# Patient Record
Sex: Female | Born: 1995 | Race: Black or African American | Hispanic: No | Marital: Married | Smoking: Never smoker
Health system: Southern US, Community
[De-identification: ages and names within clinical notes are randomized; demographics above are authoritative.]

## PROBLEM LIST (undated history)

## (undated) ENCOUNTER — Inpatient Hospital Stay (HOSPITAL_COMMUNITY): Payer: Self-pay

## (undated) ENCOUNTER — Emergency Department (HOSPITAL_COMMUNITY): Admission: EM | Payer: Medicaid Other | Source: Home / Self Care

## (undated) DIAGNOSIS — Z5189 Encounter for other specified aftercare: Secondary | ICD-10-CM

## (undated) DIAGNOSIS — G500 Trigeminal neuralgia: Secondary | ICD-10-CM

## (undated) DIAGNOSIS — Z789 Other specified health status: Secondary | ICD-10-CM

## (undated) HISTORY — DX: Trigeminal neuralgia: G50.0

## (undated) HISTORY — PX: INDUCED ABORTION: SHX677

## (undated) HISTORY — PX: NO PAST SURGERIES: SHX2092

## (undated) HISTORY — DX: Encounter for other specified aftercare: Z51.89

---

## 2018-04-21 ENCOUNTER — Encounter (HOSPITAL_COMMUNITY): Payer: Self-pay | Admitting: Emergency Medicine

## 2018-04-21 ENCOUNTER — Emergency Department (HOSPITAL_COMMUNITY): Payer: BC Managed Care – PPO

## 2018-04-21 ENCOUNTER — Emergency Department (HOSPITAL_COMMUNITY)
Admission: EM | Admit: 2018-04-21 | Discharge: 2018-04-21 | Disposition: A | Discharge status: Home / Self Care | Payer: BC Managed Care – PPO | Attending: Emergency Medicine | Admitting: Emergency Medicine

## 2018-04-21 ENCOUNTER — Other Ambulatory Visit: Payer: Self-pay

## 2018-04-21 DIAGNOSIS — Z2913 Encounter for prophylactic Rho(D) immune globulin: Secondary | ICD-10-CM | POA: Insufficient documentation

## 2018-04-21 DIAGNOSIS — O418X10 Other specified disorders of amniotic fluid and membranes, first trimester, not applicable or unspecified: Secondary | ICD-10-CM

## 2018-04-21 DIAGNOSIS — O208 Other hemorrhage in early pregnancy: Secondary | ICD-10-CM | POA: Insufficient documentation

## 2018-04-21 DIAGNOSIS — Z3A01 Less than 8 weeks gestation of pregnancy: Secondary | ICD-10-CM | POA: Diagnosis not present

## 2018-04-21 DIAGNOSIS — O4690 Antepartum hemorrhage, unspecified, unspecified trimester: Secondary | ICD-10-CM

## 2018-04-21 DIAGNOSIS — O209 Hemorrhage in early pregnancy, unspecified: Secondary | ICD-10-CM | POA: Diagnosis present

## 2018-04-21 DIAGNOSIS — O468X1 Other antepartum hemorrhage, first trimester: Secondary | ICD-10-CM

## 2018-04-21 LAB — ABO/RH: ABO/RH(D): O NEG

## 2018-04-21 MED ORDER — RHO D IMMUNE GLOBULIN 1500 UNIT/2ML IJ SOSY
300.0000 ug | PREFILLED_SYRINGE | Freq: Once | INTRAMUSCULAR | Status: AC
  Administered 2018-04-21: 300 ug via INTRAMUSCULAR
  Filled 2018-04-21: qty 2

## 2018-04-21 NOTE — ED Notes (Signed)
US at bedside

## 2018-04-21 NOTE — ED Triage Notes (Signed)
Pt arriving [redacted] weeks pregnant with vaginal bleeding that began today. Pt reports feeling a cramping sensation as well.

## 2018-04-21 NOTE — ED Provider Notes (Signed)
[facility]-EMERGENCY DEPT Provider Note   CSN: [id number] Arrival date & time: 04/21/18  4098     History   Chief Complaint Chief Complaint  Patient presents with  . Vaginal Bleeding    HPI Lindsay Phillips is a 22 y.o. female.  Patient is a 22 year old female who presents with vaginal bleeding.  She has about [redacted] weeks pregnant.  She states her last menstrual.  Was around 1 October.  She had a positive pregnancy test confirmed [place] [facility] but she has not had an ultrasound.  She today started having vaginal bleeding which she describes as a normal period type bleeding.  She has a little bit of lower abdominal cramping.  No dizziness.  No nausea or vomiting.  She is G2 she has had one prior abortion.  She is Rh-.     History reviewed. No pertinent past medical history.  There are no active problems to display for this patient.   History reviewed. No pertinent surgical history.   OB History    Gravida  1   Para      Term      Preterm      AB      Living        SAB      TAB      Ectopic      Multiple      Live Births               Home Medications    Prior to Admission medications   Medication Sig Start Date End Date Taking? Authorizing Provider  Omega-3 Fatty Acids (FISH OIL) 1000 MG CAPS Take 1 capsule by mouth daily.   Yes [provider]  Prenatal Vit-Fe Fumarate-FA (PRENATAL MULTIVITAMIN) TABS tablet Take 1 tablet by mouth daily at 12 noon.   Yes [provider]    Family History No family history on file.  Social History Social History   Tobacco Use  . Smoking status: Not on file  Substance Use Topics  . Alcohol use: Not on file  . Drug use: Not on file     Allergies   Patient has no known allergies.   Review of Systems Review of Systems  Constitutional: Negative for chills, diaphoresis, fatigue and fever.  HENT: Negative for congestion, rhinorrhea and sneezing.   Eyes:  Negative.   Respiratory: Negative for cough, chest tightness and shortness of breath.   Cardiovascular: Negative for chest pain and leg swelling.  Gastrointestinal: Negative for abdominal pain, blood in stool, diarrhea, nausea and vomiting.  Genitourinary: Positive for vaginal bleeding. Negative for difficulty urinating, flank pain, frequency, hematuria and vaginal discharge.  Musculoskeletal: Negative for arthralgias and back pain.  Skin: Negative for rash.  Neurological: Negative for dizziness, speech difficulty, weakness, numbness and headaches.     Physical Exam Updated Vital Signs BP 112/77   Pulse 84   Temp 97.9 F (36.6 C) (Oral)   Resp 16   SpO2 99%   Physical Exam  Constitutional: She is oriented to person, place, and time. She appears well-developed and well-nourished.  HENT:  Head: Normocephalic and atraumatic.  Eyes: Pupils are equal, round, and reactive to light.  Neck: Normal range of motion. Neck supple.  Cardiovascular: Normal rate, regular rhythm and normal heart sounds.  Pulmonary/Chest: Effort normal and breath sounds normal. No respiratory distress. She has no wheezes. She has no rales. She exhibits no tenderness.  Abdominal: Soft. Bowel sounds are normal. There is  no tenderness. There is no rebound and no guarding.  Musculoskeletal: Normal range of motion. She exhibits no edema.  Lymphadenopathy:    She has no cervical adenopathy.  Neurological: She is alert and oriented to person, place, and time.  Skin: Skin is warm and dry. No rash noted.  Psychiatric: She has a normal mood and affect.     ED Treatments / Results  Labs (all labs ordered are listed, but only abnormal results are displayed) Labs Reviewed  I-STAT BETA HCG BLOOD, ED ([facility], [facility], [facility] ONLY)  ABO/RH  RH IG WORKUP (INCLUDES ABO/RH)    EKG None  Radiology US Ob Comp < 14 Wks  Result Date: 04/21/2018 CLINICAL DATA:  Vaginal bleeding beginning today. Gestational age by LMP of 7 weeks 0  days. EXAM: OBSTETRIC <14 WK [place] AND TRANSVAGINAL OB US TECHNIQUE: Both transabdominal and transvaginal ultrasound examinations were performed for complete evaluation of the gestation as well as the maternal uterus, adnexal regions, and pelvic cul-de-sac. Transvaginal technique was performed to assess early pregnancy. COMPARISON:  None. FINDINGS: Intrauterine gestational sac: Single Yolk sac:  Visualized. Embryo:  Visualized. Cardiac Activity: Visualized. Heart Rate: 128 bpm CRL:  8 mm   6 w   6 d                  [place] EDC: 12/09/2018 Subchorionic hemorrhage:  Small subchorionic hemorrhage. Maternal uterus/adnexae: Normal appearance of both ovaries. Left ovarian corpus luteum noted. Retroverted uterus. No adnexal mass or abnormal free fluid identified. IMPRESSION: Single living IUP measuring 6 weeks 6 days, with [place] EDC of 12/09/2018. Small subchorionic hemorrhage noted. Electronically Signed   By: Myles Rosenthal M.D.   On: 04/21/2018 22:06   US Ob Transvaginal  Result Date: 04/21/2018 CLINICAL DATA:  Vaginal bleeding beginning today. Gestational age by LMP of 7 weeks 0 days. EXAM: OBSTETRIC <14 WK [place] AND TRANSVAGINAL OB US TECHNIQUE: Both transabdominal and transvaginal ultrasound examinations were performed for complete evaluation of the gestation as well as the maternal uterus, adnexal regions, and pelvic cul-de-sac. Transvaginal technique was performed to assess early pregnancy. COMPARISON:  None. FINDINGS: Intrauterine gestational sac: Single Yolk sac:  Visualized. Embryo:  Visualized. Cardiac Activity: Visualized. Heart Rate: 128 bpm CRL:  8 mm   6 w   6 d                  [place] EDC: 12/09/2018 Subchorionic hemorrhage:  Small subchorionic hemorrhage. Maternal uterus/adnexae: Normal appearance of both ovaries. Left ovarian corpus luteum noted. Retroverted uterus. No adnexal mass or abnormal free fluid identified. IMPRESSION: Single living IUP measuring 6 weeks 6 days, with [place] EDC of 12/09/2018. Small subchorionic  hemorrhage noted. Electronically Signed   By: Myles Rosenthal M.D.   On: 04/21/2018 22:06    Procedures Procedures (including critical care time)  Medications Ordered in ED Medications  rho (d) immune globulin (RHIG/RHOPHYLAC) injection 300 mcg (has no administration in time range)     Initial Impression / Assessment and Plan / ED Course  I have reviewed the triage vital signs and the nursing notes.  Pertinent labs & imaging results that were available during my care of the patient were reviewed by me and considered in my medical decision making (see chart for details).     Patient is a 23 year old female who presents with vaginal bleeding.  Her quant hCG is greater than 2000.  Her blood type is Rh-.  She was given RhoGam.  Her bleeding has stopped.  She  has no significant pain on abdominal exam.  Ultrasound shows a single live intrauterine pregnancy with a heartbeat.  There is a subchorionic hemorrhage present.  She was advised to follow-up with an OB/GYN.  She was advised to return [facility] the emergency room[facility] if she has any worsening abdominal pain, heavy vaginal bleeding, dizziness or other worsening symptoms.  Final Clinical Impressions(s) / ED Diagnoses   Final diagnoses:  Vaginal bleeding in pregnancy  Subchorionic hematoma in first trimester, single or unspecified fetus    ED Discharge Orders    None       Rolan BuccoBelfi, Catera Hankins, MD 04/21/18 2243

## 2018-04-21 NOTE — ED Notes (Addendum)
I Stat Beta HCG result was >2000.0 IU/L MD Belfi made aware

## 2018-04-21 NOTE — ED Notes (Signed)
Rh negative, type O negative.

## 2018-04-22 LAB — RH IG WORKUP (INCLUDES ABO/RH)
ABO/RH(D): O NEG
ANTIBODY SCREEN: NEGATIVE
GESTATIONAL AGE(WKS): 7
Unit division: 0
Unit tag comment: 7

## 2018-04-22 LAB — I-STAT BETA HCG BLOOD, ED (MC, WL, AP ONLY)

## 2018-05-13 ENCOUNTER — Other Ambulatory Visit (HOSPITAL_COMMUNITY)
Admission: RE | Admit: 2018-05-13 | Discharge: 2018-05-13 | Disposition: A | Discharge status: Home / Self Care | Payer: BC Managed Care – PPO | Source: Ambulatory Visit | Attending: Advanced Practice Midwife | Admitting: Advanced Practice Midwife

## 2018-05-13 ENCOUNTER — Encounter: Payer: Self-pay | Admitting: Advanced Practice Midwife

## 2018-05-13 ENCOUNTER — Ambulatory Visit (INDEPENDENT_AMBULATORY_CARE_PROVIDER_SITE_OTHER): Payer: BC Managed Care – PPO | Admitting: Advanced Practice Midwife

## 2018-05-13 DIAGNOSIS — Z3400 Encounter for supervision of normal first pregnancy, unspecified trimester: Secondary | ICD-10-CM | POA: Insufficient documentation

## 2018-05-13 DIAGNOSIS — Z3401 Encounter for supervision of normal first pregnancy, first trimester: Secondary | ICD-10-CM

## 2018-05-13 DIAGNOSIS — Z6791 Unspecified blood type, Rh negative: Secondary | ICD-10-CM | POA: Diagnosis not present

## 2018-05-13 DIAGNOSIS — O26891 Other specified pregnancy related conditions, first trimester: Secondary | ICD-10-CM

## 2018-05-13 DIAGNOSIS — O26899 Other specified pregnancy related conditions, unspecified trimester: Secondary | ICD-10-CM | POA: Insufficient documentation

## 2018-05-13 LAB — POCT URINALYSIS DIPSTICK OB
Blood, UA: NEGATIVE
Glucose, UA: NEGATIVE
Leukocytes, UA: NEGATIVE
PH UA: 7 (ref 5.0–8.0)
SPEC GRAV UA: 1.015 (ref 1.010–1.025)

## 2018-05-13 MED ORDER — DOXYLAMINE-PYRIDOXINE 10-10 MG PO TBEC: 2.0000 | DELAYED_RELEASE_TABLET | Freq: Every evening | ORAL | 5 refills | Status: DC | PRN

## 2018-05-13 NOTE — Patient Instructions (Addendum)
First Trimester of Pregnancy The first trimester of pregnancy is from week 1 until the end of week 13 (months 1 through 3). During this time, your baby will begin to develop inside you. At 6-8 weeks, the eyes and face are formed, and the heartbeat can be seen on ultrasound. At the end of 12 weeks, all the baby's organs are formed. Prenatal care is all the medical care you receive before the birth of your baby. Make sure you get good prenatal care and follow all of your doctor's instructions. Follow these instructions at home: Medicines  Take over-the-counter and prescription medicines only as told by your doctor. Some medicines are safe and some medicines are not safe during pregnancy.  Take a prenatal vitamin that contains at least 600 micrograms (mcg) of folic acid.  If you have trouble pooping (constipation), take medicine that will make your stool soft (stool softener) if your doctor approves. Eating and drinking  Eat regular, healthy meals.  Your doctor will tell you the amount of weight gain that is right for you.  Avoid raw meat and uncooked cheese.  If you feel sick to your stomach (nauseous) or throw up (vomit): ? Eat 4 or 5 small meals a day instead of 3 large meals. ? Try eating a few soda crackers. ? Drink liquids between meals instead of during meals.  To prevent constipation: ? Eat foods that are high in fiber, like fresh fruits and vegetables, whole grains, and beans. ? Drink enough fluids to keep your pee (urine) clear or pale yellow. Activity  Exercise only as told by your doctor. Stop exercising if you have cramps or pain in your lower belly (abdomen) or low back.  Do not exercise if it is too hot, too humid, or if you are in a place of great height (high altitude).  Try to avoid standing for long periods of time. Move your legs often if you must stand in one place for a long time.  Avoid heavy lifting.  Wear low-heeled shoes. Sit and stand up straight.  You  can have sex unless your doctor tells you not to. Relieving pain and discomfort  Wear a good support bra if your breasts are sore.  Take warm water baths (sitz baths) to soothe pain or discomfort caused by hemorrhoids. Use hemorrhoid cream if your doctor says it is okay.  Rest with your legs raised if you have leg cramps or low back pain.  If you have puffy, bulging veins (varicose veins) in your legs: ? Wear support hose or compression stockings as told by your doctor. ? Raise (elevate) your feet for 15 minutes, 3-4 times a day. ? Limit salt in your food. Prenatal care  Schedule your prenatal visits by the twelfth week of pregnancy.  Write down your questions. Take them to your prenatal visits.  Keep all your prenatal visits as told by your doctor. This is important. Safety  Wear your seat belt at all times when driving.  Make a list of emergency phone numbers. The list should include numbers for family, friends, the hospital, and police and fire departments. General instructions  Ask your doctor for a referral to a local prenatal class. Begin classes no later than at the start of month 6 of your pregnancy.  Ask for help if you need counseling or if you need help with nutrition. Your doctor can give you advice or tell you where to go for help.  Do not use hot tubs, steam rooms, or   saunas.  Do not douche or use tampons or scented sanitary pads.  Do not cross your legs for long periods of time.  Avoid all herbs and alcohol. Avoid drugs that are not approved by your doctor.  Do not use any tobacco products, including cigarettes, chewing tobacco, and electronic cigarettes. If you need help quitting, ask your doctor. You may get counseling or other support to help you quit.  Avoid cat litter boxes and soil used by cats. These carry germs that can cause birth defects in the baby and can cause a loss of your baby (miscarriage) or stillbirth.  Visit your dentist. At home, brush  your teeth with a soft toothbrush. Be gentle when you floss. Contact a doctor if:  You are dizzy.  You have mild cramps or pressure in your lower belly.  You have a nagging pain in your belly area.  You continue to feel sick to your stomach, you throw up, or you have watery poop (diarrhea).  You have a bad smelling fluid coming from your vagina.  You have pain when you pee (urinate).  You have increased puffiness (swelling) in your face, hands, legs, or ankles. Get help right away if:  You have a fever.  You are leaking fluid from your vagina.  You have spotting or bleeding from your vagina.  You have very bad belly cramping or pain.  You gain or lose weight rapidly.  You throw up blood. It may look like coffee grounds.  You are around people who have [place] measles, fifth disease, or chickenpox.  You have a very bad headache.  You have shortness of breath.  You have any kind of trauma, such as from a fall or a car accident. Summary  The first trimester of pregnancy is from week 1 until the end of week 13 (months 1 through 3).  To take care of yourself and your unborn baby, you will need to eat healthy meals, take medicines only if your doctor tells you to do so, and do activities that are safe for you and your baby.  Keep all follow-up visits as told by your doctor. This is important as your doctor will have to ensure that your baby is healthy and growing well. This information is not intended to replace advice given to you by your health care provider. Make sure you discuss any questions you have with your health care provider. Document Released: 10/31/2007 Document Revised: 05/22/2016 Document Reviewed: 05/22/2016 Elsevier Interactive Patient Education  2017 Elsevier Inc.  Morning Sickness Morning sickness is when you feel sick to your stomach (nauseous) during pregnancy. This nauseous feeling may or may not come with vomiting. It often occurs in the morning but  can be a problem any time of day. Morning sickness is most common during the first trimester, but it may continue throughout pregnancy. While morning sickness is unpleasant, it is usually harmless unless you develop severe and continual vomiting (hyperemesis gravidarum). This condition requires more intense treatment. What are the causes? The cause of morning sickness is not completely known but seems to be related to normal hormonal changes that occur in pregnancy. What increases the risk? You are at greater risk if you:  Experienced nausea or vomiting before your pregnancy.  Had morning sickness during a previous pregnancy.  Are pregnant with more than one baby, such as twins.  How is this treated? Do not use any medicines (prescription, over-the-counter, or herbal) for morning sickness without first talking to your health care  provider. Your health care provider may prescribe or recommend:  Vitamin B6 supplements.  Anti-nausea medicines.  The herbal medicine ginger.  Follow these instructions at home:  Only take over-the-counter or prescription medicines as directed by your health care provider.  Taking multivitamins before getting pregnant can prevent or decrease the severity of morning sickness in most women.  Eat a piece of dry toast or unsalted crackers before getting out of bed in the morning.  Eat five or six small meals a day.  Eat dry and bland foods (rice, baked potato). Foods high in carbohydrates are often helpful.  Do not drink liquids with your meals. Drink liquids between meals.  Avoid greasy, fatty, and spicy foods.  Get someone to cook for you if the smell of any food causes nausea and vomiting.  If you feel nauseous after taking prenatal vitamins, take the vitamins at night or with a snack.  Snack on protein foods (nuts, yogurt, cheese) between meals if you are hungry.  Eat unsweetened gelatins for desserts.  Wearing an acupressure wristband (worn for  sea sickness) may be helpful.  Acupuncture may be helpful.  Do not smoke.  Get a humidifier to keep the air in your house free of odors.  Get plenty of fresh air. Contact a health care provider if:  Your home remedies are not working, and you need medicine.  You feel dizzy or lightheaded.  You are losing weight. Get help right away if:  You have persistent and uncontrolled nausea and vomiting.  You pass out (faint). This information is not intended to replace advice given to you by your health care provider. Make sure you discuss any questions you have with your health care provider. Document Released: 07/05/2006 Document Revised: 10/20/2015 Document Reviewed: 10/29/2012 Elsevier Interactive Patient Education  2017 [employer].

## 2018-05-13 NOTE — Progress Notes (Signed)
  Subjective:    Lindsay PollockMariah Youmans is a G2P0010 1619w6d being seen today for her first obstetrical visit.  Her obstetrical history is significant for Rh Negative. Patient does intend to breast feed. Pregnancy history fully reviewed.  Patient reports no complaints. Except some nausea at times.  May be interested in waterbirth.  Vitals:   05/13/18 0838 05/13/18 0841  BP: 117/77   Pulse: 73   Weight: 73.5 kg   Height:  5\' 5"  (1.651 m)    HISTORY: OB History  Gravida Para Term Preterm AB Living  2       1    SAB TAB Ectopic Multiple Live Births    1          # Outcome Date GA Lbr Len/2nd Weight Sex Delivery Anes PTL Lv  2 Current           1 TAB 06/2016           No past medical history on file. No past surgical history on file. No family history on file.   Exam    Uterus:     Pelvic Exam:    Perineum: No Hemorrhoids   Vulva: Bartholin's, Urethra, Skene's normal   Vagina:  normal mucosa, normal discharge   pH:    Cervix: no lesions   Adnexa: normal adnexa and no mass, fullness, tenderness   Bony Pelvis: gynecoid  System: Breast:  normal appearance, no masses or tenderness   Skin: normal coloration and turgor, no rashes    Neurologic: oriented, grossly non-focal   Extremities: normal strength, tone, and muscle mass   HEENT neck supple with midline trachea   Mouth/Teeth mucous membranes moist, pharynx normal without lesions   Neck supple and no masses   Cardiovascular: regular rate and rhythm   Respiratory:  appears well, vitals normal, no respiratory distress, acyanotic, normal RR, ear and throat exam is normal, neck free of mass or lymphadenopathy, chest clear, no wheezing, crepitations, rhonchi, normal symmetric air entry   Abdomen: soft, non-tender; bowel sounds normal; no masses,  no organomegaly   Urinary: urethral meatus normal      Assessment:    Pregnancy: G2P0010 Patient Active Problem List   Diagnosis Date Noted  . Supervision of normal first pregnancy,  antepartum 05/13/2018        Plan:     Initial labs drawn. Prenatal vitamins. Problem list reviewed and updated. Genetic Screening discussed First Screen: Desires Panorama.  Ultrasound discussed; fetal survey: requested.  Follow up in 4 weeks. 50% of 30 min visit spent on counseling and coordination of care.   Welcomed to Youth workerpractice Info sheet given on [place] Routines reviewed. Open to seeing students Panorama at next visit   Wynelle BourgeoisMarie Kristain Hu 05/13/2018

## 2018-05-13 NOTE — Progress Notes (Signed)
DATING AND VIABILITY SONOGRAM   Jeffie PollockMariah Youmans is a 22 y.o. year old G2P0010 with LMP Patient's last menstrual period was 03/05/2018 (exact date). which would correlate to  7870w6d weeks gestation.  She has regular menstrual cycles.   She is here today for a confirmatory initial sonogram.    GESTATION: SINGLETON  FETAL ACTIVITY:          Heart rate         180 bpm          The fetus is active.   ADNEXA: The ovaries are normal.   GESTATIONAL AGE AND  BIOMETRICS:  Gestational criteria: Estimated Date of Delivery: 12/10/18 by LMP now at 8170w6d  Previous Scans:0  GESTATIONAL SAC           4.03 cm         9-3 weeks  CROWN RUMP LENGTH          2.68 cm         9-3 weeks                                                                               AVERAGE EGA(BY THIS SCAN):  9-3weeks  WORKING EDD( LMP ):  12/10/2018     TECHNICIAN COMMENTS: Patient informed that the ultrasound is considered a limited obstetric ultrasound and is not intended to be a complete ultrasound exam. Patient also informed that the ultrasound is not being completed with the intent of assessing for fetal or placental anomalies or any pelvic abnormalities. Explained that the purpose of today's ultrasound is to assess for fetal heart rate. Patient acknowledges the purpose of the exam and the limitations of the study.     Armandina StammerJennifer Fannie Gathright 05/13/2018 9:00 AM

## 2018-05-14 LAB — GC/CHLAMYDIA PROBE AMP (~~LOC~~) NOT AT ARMC
Chlamydia: NEGATIVE
Neisseria Gonorrhea: NEGATIVE

## 2018-05-16 LAB — URINE CULTURE, OB REFLEX

## 2018-05-16 LAB — CULTURE, OB URINE

## 2018-05-20 LAB — AB SCR+ANTIBODY ID: ANTIBODY SCREEN: POSITIVE — AB

## 2018-05-20 LAB — SMN1 COPY NUMBER ANALYSIS (SMA CARRIER SCREENING)

## 2018-05-20 LAB — OBSTETRIC PANEL, INCLUDING HIV
BASOS ABS: 0.1 10*3/uL (ref 0.0–0.2)
Basos: 1 %
EOS (ABSOLUTE): 0 10*3/uL (ref 0.0–0.4)
Eos: 1 %
HEMATOCRIT: 39.2 % (ref 34.0–46.6)
HEP B S AG: NEGATIVE
HIV SCREEN 4TH GENERATION: NONREACTIVE
Hemoglobin: 12.7 g/dL (ref 11.1–15.9)
IMMATURE GRANS (ABS): 0 10*3/uL (ref 0.0–0.1)
Immature Granulocytes: 1 %
LYMPHS: 22 %
Lymphocytes Absolute: 1.9 10*3/uL (ref 0.7–3.1)
MCH: 28.3 pg (ref 26.6–33.0)
MCHC: 32.4 g/dL (ref 31.5–35.7)
MCV: 87 fL (ref 79–97)
MONOCYTES: 6 %
Monocytes Absolute: 0.5 10*3/uL (ref 0.1–0.9)
NEUTROS ABS: 6 10*3/uL (ref 1.4–7.0)
Neutrophils: 69 %
PLATELETS: 245 10*3/uL (ref 150–450)
RBC: 4.49 x10E6/uL (ref 3.77–5.28)
RDW: 11.9 % — AB (ref 12.3–15.4)
RPR: NONREACTIVE
RUBELLA: 3.81 {index} (ref >0.99)
Rh Factor: NEGATIVE
WBC: 8.5 10*3/uL (ref 3.4–10.8)

## 2018-05-20 LAB — HEMOGLOBINOPATHY EVALUATION
Ferritin: 34 ng/mL (ref 15–150)
HGB A2 QUANT: 1.9 % (ref 1.8–3.2)
HGB F QUANT: 0 % (ref 0.0–2.0)
Hgb A: 98.1 % (ref 96.4–98.8)
Hgb C: 0 %
Hgb S: 0 %
Hgb Solubility: NEGATIVE
Hgb Variant: 0 %

## 2018-05-20 LAB — CYSTIC FIBROSIS GENE TEST

## 2018-05-28 NOTE — L&D Delivery Note (Signed)
Delivery Note Pushed only for a short while to SVD  At 7:15 AM a viable and healthy female was delivered via Vaginal, Spontaneous (Presentation: ;  ).  APGAR: 9, 9; weight 6 lb 8.8 oz (2970 g).  No difficulty with shoulders.   Placenta status: spontaneous and grossly intact with 3 vessel Cord:  with the following complications: .Nuchal cord x 2 tight, delivered through and unwound after delivery  There was blood loss prior to delivery of placenta.  Early Pitocin was begun at birth.  Bladder emptied by cath for 162ml.  Cytotec 850mcg buccally given.  Dr Nehemiah Settle present in room  Anesthesia:  Local for repair Episiotomy: None Lacerations: 1st degree;Perineal Suture Repair: 3.0 vicryl rapide Est. Blood Loss (mL): 1426  Mom to postpartum.  Baby to Couplet care / Skin to Skin.  Lindsay Phillips 12/03/2018, 9:03 AM

## 2018-06-02 ENCOUNTER — Telehealth: Payer: Self-pay

## 2018-06-02 NOTE — Telephone Encounter (Signed)
Pt called the office stating that she stepped on a nail. Pt states that the nail wasn't rusty but it went through her skin and caused her foot to bleed. Advised pt to get a tetanus shot. Understanding was voiced.

## 2018-06-10 ENCOUNTER — Ambulatory Visit (INDEPENDENT_AMBULATORY_CARE_PROVIDER_SITE_OTHER): Payer: Medicaid Other | Admitting: Advanced Practice Midwife

## 2018-06-10 ENCOUNTER — Encounter: Payer: Self-pay | Admitting: Advanced Practice Midwife

## 2018-06-10 VITALS — BP 125/82 | HR 99 | Wt 158.0 lb

## 2018-06-10 DIAGNOSIS — Z3482 Encounter for supervision of other normal pregnancy, second trimester: Secondary | ICD-10-CM | POA: Diagnosis not present

## 2018-06-10 DIAGNOSIS — Z3400 Encounter for supervision of normal first pregnancy, unspecified trimester: Secondary | ICD-10-CM

## 2018-06-10 DIAGNOSIS — Z3401 Encounter for supervision of normal first pregnancy, first trimester: Secondary | ICD-10-CM

## 2018-06-10 DIAGNOSIS — R350 Frequency of micturition: Secondary | ICD-10-CM

## 2018-06-10 DIAGNOSIS — Z6791 Unspecified blood type, Rh negative: Secondary | ICD-10-CM

## 2018-06-10 DIAGNOSIS — Z3A13 13 weeks gestation of pregnancy: Secondary | ICD-10-CM

## 2018-06-10 DIAGNOSIS — O26891 Other specified pregnancy related conditions, first trimester: Secondary | ICD-10-CM

## 2018-06-10 NOTE — Patient Instructions (Signed)
Second Trimester of Pregnancy  The second trimester is from week 14 through week 27 (month 4 through 6). This is often the time in pregnancy that you feel your best. Often times, morning sickness has lessened or quit. You may have more energy, and you may get hungry more often. Your unborn baby is growing rapidly. At the end of the sixth month, he or she is about 9 inches long and weighs about 1 pounds. You will likely feel the baby move between 18 and 20 weeks of pregnancy. Follow these instructions at home: Medicines  Take over-the-counter and prescription medicines only as told by your doctor. Some medicines are safe and some medicines are not safe during pregnancy.  Take a prenatal vitamin that contains at least 600 micrograms (mcg) of folic acid.  If you have trouble pooping (constipation), take medicine that will make your stool soft (stool softener) if your doctor approves. Eating and drinking   Eat regular, healthy meals.  Avoid raw meat and uncooked cheese.  If you get low calcium from the food you eat, talk to your doctor about taking a daily calcium supplement.  Avoid foods that are high in fat and sugars, such as fried and sweet foods.  If you feel sick to your stomach (nauseous) or throw up (vomit): ? Eat 4 or 5 small meals a day instead of 3 large meals. ? Try eating a few soda crackers. ? Drink liquids between meals instead of during meals.  To prevent constipation: ? Eat foods that are high in fiber, like fresh fruits and vegetables, whole grains, and beans. ? Drink enough fluids to keep your pee (urine) clear or pale yellow. Activity  Exercise only as told by your doctor. Stop exercising if you start to have cramps.  Do not exercise if it is too hot, too humid, or if you are in a place of great height (high altitude).  Avoid heavy lifting.  Wear low-heeled shoes. Sit and stand up straight.  You can continue to have sex unless your doctor tells you not  to. Relieving pain and discomfort  Wear a good support bra if your breasts are tender.  Take warm water baths (sitz baths) to soothe pain or discomfort caused by hemorrhoids. Use hemorrhoid cream if your doctor approves.  Rest with your legs raised if you have leg cramps or low back pain.  If you develop puffy, bulging veins (varicose veins) in your legs: ? Wear support hose or compression stockings as told by your doctor. ? Raise (elevate) your feet for 15 minutes, 3-4 times a day. ? Limit salt in your food. Prenatal care  Write down your questions. Take them to your prenatal visits.  Keep all your prenatal visits as told by your doctor. This is important. Safety  Wear your seat belt when driving.  Make a list of emergency phone numbers, including numbers for family, friends, the hospital, and police and fire departments. General instructions  Ask your doctor about the right foods to eat or for help finding a counselor, if you need these services.  Ask your doctor about local prenatal classes. Begin classes before month 6 of your pregnancy.  Do not use hot tubs, steam rooms, or saunas.  Do not douche or use tampons or scented sanitary pads.  Do not cross your legs for long periods of time.  Visit your dentist if you have not done so. Use a soft toothbrush to brush your teeth. Floss gently.  Avoid all smoking, herbs,   and alcohol. Avoid drugs that are not approved by your doctor.  Do not use any products that contain nicotine or tobacco, such as cigarettes and e-cigarettes. If you need help quitting, ask your doctor.  Avoid cat litter boxes and soil used by cats. These carry germs that can cause birth defects in the baby and can cause a loss of your baby (miscarriage) or stillbirth. Contact a doctor if:  You have mild cramps or pressure in your lower belly.  You have pain when you pee (urinate).  You have bad smelling fluid coming from your vagina.  You continue to  feel sick to your stomach (nauseous), throw up (vomit), or have watery poop (diarrhea).  You have a nagging pain in your belly area.  You feel dizzy. Get help right away if:  You have a fever.  You are leaking fluid from your vagina.  You have spotting or bleeding from your vagina.  You have severe belly cramping or pain.  You lose or gain weight rapidly.  You have trouble catching your breath and have chest pain.  You notice sudden or extreme puffiness (swelling) of your face, hands, ankles, feet, or legs.  You have not felt the baby move in over an hour.  You have severe headaches that do not go away when you take medicine.  You have trouble seeing. Summary  The second trimester is from week 14 through week 27 (months 4 through 6). This is often the time in pregnancy that you feel your best.  To take care of yourself and your unborn baby, you will need to eat healthy meals, take medicines only if your doctor tells you to do so, and do activities that are safe for you and your baby.  Call your doctor if you get sick or if you notice anything unusual about your pregnancy. Also, call your doctor if you need help with the right food to eat, or if you want to know what activities are safe for you. This information is not intended to replace advice given to you by your health care provider. Make sure you discuss any questions you have with your health care provider. Document Released: 08/08/2009 Document Revised: 06/19/2016 Document Reviewed: 06/19/2016 Elsevier Interactive Patient Education  2019 [employer]. Genetic Testing During Pregnancy Genetic testing during pregnancy is also called prenatal genetic testing. This type of testing can determine if your baby is at risk of being born with a disorder caused by abnormal genes or chromosomes (genetic disorder). Chromosomes contain genes that control how your baby will develop in your womb. There are many different genetic  disorders. Examples of genetic disorders that may be found through genetic testing include Down syndrome and cystic fibrosis. Gene changes (mutations) can be passed down through families. Genetic testing is offered to all women before or during pregnancy. You can choose whether to have genetic testing. Why is genetic testing done? Genetic testing is done during pregnancy to find out whether your child is at risk for a genetic disorder. Having genetic testing allows you to:  Discuss your test results and options with a genetic counselor.  Prepare for a baby that may be born with a genetic disorder. Learning about the disorder ahead of time helps you be better prepared to manage it. Your health care providers can also be prepared in case your baby requires special care before or after birth.  Consider whether you want to continue with the pregnancy. In some cases, genetic testing may be done  to learn about the traits a child will inherit. Types of genetic tests There are two basic types of genetic testing. Screening tests indicate whether your developing baby (fetus) is at higher risk for a genetic disorder. Diagnostic tests check actual fetal cells to diagnose a genetic disorder. Screening tests     Screening tests will not harm your baby. They are recommended for all pregnant women. Types of screening tests include:  Carrier screening. This test involves checking genes from both parents by testing their blood or saliva. The test checks to find out if the parents carry a genetic mutation that may be passed to a baby. In most cases, both parents must carry the mutation for a baby to be at risk.  First trimester screening. This test combines a blood test with sound wave imaging of your baby (fetal ultrasound). This screening test checks for a risk of Down syndrome or other defects caused by having extra chromosomes. It also checks for defects of the heart, abdomen, or skeleton.  Second trimester  screening also combines a blood test with a fetal ultrasound exam. It checks for a risk of genetic defects of the face, brain, spine, heart, or limbs.  Combined or sequential screening. This type of testing combines the results of first and second trimester screening. This type of testing may be more accurate than first or second trimester screening alone.  Cell-free DNA testing. This is a blood test that detects cells released by the placenta that get into the mother's blood. It can be used to check for a risk of Down syndrome, other extra chromosome syndromes, and disorders caused by abnormal numbers of sex chromosomes. This test can be done any time after 10 weeks of pregnancy.  Diagnostic tests Diagnostic tests carry slight risks of problems, including bleeding, infection, and loss of the pregnancy. These tests are done only if your baby is at risk for a genetic disorder. You may meet with a genetic counselor to discuss the risks and benefits before having diagnostic tests. Examples of diagnostic tests include:  Chorionic villus sampling (CVS). This involves a procedure to remove and test a sample of cells taken from the placenta. The procedure may be done between 10 and 12 weeks of pregnancy.  Amniocentesis. This involves a procedure to remove and test a sample of fluid (amniotic fluid) and cells from the sac that surrounds the developing baby. The procedure may be done between 15 and 20 weeks of pregnancy. What do the results mean? For a screening test:  If the results are negative, it often means that your child is not at higher risk. There is still a slight chance your child could have a genetic disorder.  If the results are positive, it does not mean your child will have a genetic disorder. It may mean that your child has a higher-than-normal risk for a genetic disorder. In that case, you may want to talk with a genetic counselor about whether you should have diagnostic genetic tests. For  a diagnostic test:  If the result is negative, it is unlikely that your child will have a genetic disorder.  If the test is positive for a genetic disorder, it is likely that your child will have the disorder. The test may not tell how severe the disorder will be. Talk with your health care provider about your options. Questions to ask your health care provider Before talking to your health care provider about genetic testing, find out if there is a history of  genetic disorders in your family. It may also help to know your family's ethnic origins. Then ask your health care provider the following questions:  Is my baby at risk for a genetic disorder?  What are the benefits of having genetic screening?  What tests are best for me and my baby?  What are the risks of each test?  If I get a positive result on a screening test, what is the next step?  Should I meet with a genetic counselor before having a diagnostic test?  Should my partner or other members of my family be tested?  How much do the tests cost? Will my insurance cover the testing? Summary  Genetic testing is done during pregnancy to find out whether your child is at risk for a genetic disorder.  Genetic testing is offered to all women before or during pregnancy. You can choose whether to have genetic testing.  There are two basic types of genetic testing. Screening tests indicate whether your developing baby (fetus) is at higher risk for a genetic disorder. Diagnostic tests check actual fetal cells to diagnose a genetic disorder.  If a diagnostic genetic test is positive, talk with your health care provider about your options. This information is not intended to replace advice given to you by your health care provider. Make sure you discuss any questions you have with your health care provider. Document Released: 07/29/2017 Document Revised: 07/29/2017 Document Reviewed: 07/29/2017 Elsevier Interactive Patient Education   2019 [employer].

## 2018-06-10 NOTE — Progress Notes (Signed)
Ultrasound was used to obtain fetal heart rate: 158 bpm. Armandina StammerJennifer Delene Morais RN

## 2018-06-10 NOTE — Addendum Note (Signed)
Addended by: Anell Barr on: 06/10/2018 09:02 AM   Modules accepted: Orders

## 2018-06-10 NOTE — Progress Notes (Signed)
   PRENATAL VISIT NOTE  Subjective:  Caryle Lichlyter is a 23 y.o. G2P0010 at [redacted]w[redacted]d being seen today for ongoing prenatal care.  She is currently monitored for the following issues for this low-risk pregnancy and has Supervision of normal first pregnancy, antepartum and Rh negative status during pregnancy on their problem list.  Patient reports Some pelvic pressure after IC, numbness in thighs in evening, improved with yoga stretching.   . Vag. Bleeding: None.   . Denies leaking of fluid.   The following portions of the patient's history were reviewed and updated as appropriate: allergies, current medications, past family history, past medical history, past social history, past surgical history and problem list. Problem list updated.  Objective:   Vitals:   06/10/18 0818  BP: 125/82  Pulse: 99  Weight: 71.7 kg    Fetal Status: Fetal Heart Rate (bpm): 158         General:  Alert, oriented and cooperative. Patient is in no acute distress.  Skin: Skin is warm and dry. No rash noted.   Cardiovascular: Normal heart rate noted  Respiratory: Normal respiratory effort, no problems with respiration noted  Abdomen: Soft, gravid, appropriate for gestational age.  Pain/Pressure: Absent     Pelvic: Cervical exam deferred        Extremities: Normal range of motion.  Edema: None  Mental Status: Normal mood and affect. Normal behavior. Normal judgment and thought content.   Assessment and Plan:  Pregnancy: G2P0010 at [redacted]w[redacted]d  1. Supervision of normal first pregnancy, antepartum Discussed options for genetic screening.  Wants to do [place] - Genetic Screening - Urine Culture  2. Rh negative status during pregnancy in first trimester Antibody screen + for Anti-D, reflecting Rhophylac - Genetic Screening  3. Urinary frequency Will check urine - Urine Culture  Preterm labor symptoms and general obstetric precautions including but not limited to vaginal bleeding, contractions, leaking of fluid  and fetal movement were reviewed in detail with the patient. Please refer to After Visit Summary for other counseling recommendations.  Return in about 4 weeks (around 07/08/2018) for [facility].  No future appointments.  Wynelle Bourgeois, CNM

## 2018-06-11 ENCOUNTER — Telehealth: Payer: Self-pay

## 2018-06-11 NOTE — Telephone Encounter (Signed)
Pt called the office stating that she is having urinary frequency and burning. Pt was seen in the office on 06/10/18 and a urine sample was sent off for culture. Pt wants medication sent in before the results come back. Called pt twice and got vm. Brytni Dray l Delma Villalva, CMA

## 2018-06-12 ENCOUNTER — Telehealth: Payer: Self-pay

## 2018-06-12 LAB — URINE CULTURE

## 2018-06-12 NOTE — Telephone Encounter (Signed)
Patient called stating that she is still having frequency in urination and on occasion it will burn.  Patient made aware that urine culture was negative.   Will route to provider for review. Armandina Stammer RN

## 2018-06-13 NOTE — Telephone Encounter (Signed)
Stay hydrated  Keep bladder empty  If persists or gets severe may need to refer to Urology -Wynelle Bourgeois, CNM   Attempted to reach patient but voicemailbox is full. Armandina Stammer RN

## 2018-06-17 ENCOUNTER — Telehealth: Payer: Self-pay

## 2018-06-17 NOTE — Telephone Encounter (Signed)
Patient given panorama results all low risk. Patient states understanding- placed copy for patient at front desk to pick up. Armandina Stammer RN

## 2018-06-27 ENCOUNTER — Encounter (HOSPITAL_COMMUNITY): Payer: Self-pay | Admitting: *Deleted

## 2018-06-27 ENCOUNTER — Telehealth: Payer: Self-pay

## 2018-06-27 ENCOUNTER — Inpatient Hospital Stay (HOSPITAL_COMMUNITY)
Admission: AD | Admit: 2018-06-27 | Discharge: 2018-06-27 | Disposition: A | Discharge status: Home / Self Care | Payer: BC Managed Care – PPO | Attending: Obstetrics and Gynecology | Admitting: Obstetrics and Gynecology

## 2018-06-27 DIAGNOSIS — Z3400 Encounter for supervision of normal first pregnancy, unspecified trimester: Secondary | ICD-10-CM

## 2018-06-27 DIAGNOSIS — R1032 Left lower quadrant pain: Secondary | ICD-10-CM | POA: Diagnosis present

## 2018-06-27 DIAGNOSIS — R102 Pelvic and perineal pain: Secondary | ICD-10-CM

## 2018-06-27 DIAGNOSIS — Z3A16 16 weeks gestation of pregnancy: Secondary | ICD-10-CM | POA: Diagnosis not present

## 2018-06-27 DIAGNOSIS — O26899 Other specified pregnancy related conditions, unspecified trimester: Secondary | ICD-10-CM

## 2018-06-27 DIAGNOSIS — O26892 Other specified pregnancy related conditions, second trimester: Secondary | ICD-10-CM

## 2018-06-27 LAB — URINALYSIS, ROUTINE W REFLEX MICROSCOPIC
Bilirubin Urine: NEGATIVE
Glucose, UA: 250 mg/dL — AB
Hgb urine dipstick: NEGATIVE
Ketones, ur: NEGATIVE mg/dL
LEUKOCYTES UA: NEGATIVE
NITRITE: NEGATIVE
PROTEIN: NEGATIVE mg/dL
SPECIFIC GRAVITY, URINE: 1.02 (ref 1.005–1.030)
pH: 8 (ref 5.0–8.0)

## 2018-06-27 LAB — WET PREP, GENITAL
Clue Cells Wet Prep HPF POC: NONE SEEN
Sperm: NONE SEEN
TRICH WET PREP: NONE SEEN
YEAST WET PREP: NONE SEEN

## 2018-06-27 MED ORDER — ACETAMINOPHEN 500 MG PO TABS
1000.0000 mg | ORAL_TABLET | Freq: Once | ORAL | Status: AC
  Administered 2018-06-27: 1000 mg via ORAL
  Filled 2018-06-27: qty 2

## 2018-06-27 NOTE — Discharge Instructions (Signed)

## 2018-06-27 NOTE — Telephone Encounter (Signed)
Patient called stating that she has had cramping in the LLQ for the lats 12 hours. Pain seems to be increasing. Patient is sixteen weeks pregnant and tearful on the phone. Denies any bleeding or leaking of fluid currently. Patient told to go to MAU at [facility] for evaluation. Patient states understanding and agreeable to plan. Armandina Stammer RN

## 2018-06-27 NOTE — MAU Provider Note (Signed)
History     CSN: [id number]  Arrival date and time: 06/27/18 1544   First Provider Initiated Contact with Patient 06/27/18 1609      Chief Complaint  Patient presents with  . Pelvic Pain   Silah Kerchner is a 23 y.o. G2P0010 at [redacted]w[redacted]d who presents today with left sided lower abdominal pain.   Pelvic Pain  The patient's primary symptoms include pelvic pain. The patient's pertinent negatives include no vaginal discharge. This is a new problem. The current episode started yesterday. The problem occurs constantly. The problem has been unchanged. Pain severity now: 3/10. The problem affects the left side. She is pregnant. Pertinent negatives include no chills, constipation (last BM 06/27/18 ), diarrhea, dysuria, fever, nausea (taking diclegis for nausea ) or vomiting. The vaginal discharge was normal. There has been no bleeding. Nothing aggravates the symptoms. She has tried warm baths for the symptoms. The treatment provided significant relief.    OB History    Gravida  2   Para      Term      Preterm      AB  1   Living        SAB      TAB  1   Ectopic      Multiple      Live Births              History reviewed. No pertinent past medical history.  Past Surgical History:  Procedure Laterality Date  . DILATION AND CURETTAGE OF UTERUS      History reviewed. No pertinent family history.  Social History   Tobacco Use  . Smoking status: Never Smoker  . Smokeless tobacco: Never Used  Substance Use Topics  . Alcohol use: Not Currently    Frequency: Never    Comment: not in pregnancy  . Drug use: Never    Allergies: No Known Allergies  Medications Prior to Admission  Medication Sig Dispense Refill Last Dose  . Doxylamine-Pyridoxine (DICLEGIS) 10-10 MG TBEC Take 2 tablets by mouth at bedtime as needed. 100 tablet 5 06/26/2018 at Unknown time  . Omega-3 Fatty Acids (FISH OIL) 1000 MG CAPS Take 1 capsule by mouth daily.   06/27/2018 at Unknown time  .  Prenatal Vit-Fe Fumarate-FA (PRENATAL MULTIVITAMIN) TABS tablet Take 1 tablet by mouth daily at 12 noon.   06/27/2018 at Unknown time    Review of Systems  Constitutional: Negative for chills and fever.  Gastrointestinal: Negative for constipation (last BM 06/27/18 ), diarrhea, nausea (taking diclegis for nausea ) and vomiting.  Genitourinary: Positive for pelvic pain. Negative for dysuria, vaginal bleeding and vaginal discharge.   Physical Exam   Blood pressure 133/74, pulse 100, temperature (!) 97.3 F (36.3 C), temperature source Oral, resp. rate 16, weight 75 kg, last menstrual period 03/05/2018, SpO2 100 %.  Physical Exam  Nursing note and vitals reviewed. Constitutional: She is oriented to person, place, and time. She appears well-developed and well-nourished. No distress.  HENT:  Head: Normocephalic.  Cardiovascular: Normal rate.  Respiratory: Effort normal.  GI: Soft. There is no abdominal tenderness. There is no rebound.  Genitourinary:    Genitourinary Comments:  External: no lesion Vagina: small amount of white discharge Cervix: pink, smooth, closed/thick  Uterus: AGA, FHT 153 with doppler    Neurological: She is alert and oriented to person, place, and time.  Skin: Skin is warm and dry.  Psychiatric: She has a normal mood and affect.     Results  for orders placed or performed during the hospital encounter of 06/27/18 (from the past 24 hour(s))  Urinalysis, Routine w reflex microscopic     Status: Abnormal   Collection Time: 06/27/18  4:07 PM  Result Value Ref Range   Color, Urine YELLOW YELLOW   APPearance CLEAR CLEAR   Specific Gravity, Urine 1.020 1.005 - 1.030   pH 8.0 5.0 - 8.0   Glucose, UA 250 (A) NEGATIVE mg/dL   Hgb urine dipstick NEGATIVE NEGATIVE   Bilirubin Urine NEGATIVE NEGATIVE   Ketones, ur NEGATIVE NEGATIVE mg/dL   Protein, ur NEGATIVE NEGATIVE mg/dL   Nitrite NEGATIVE NEGATIVE   Leukocytes, UA NEGATIVE NEGATIVE  Wet prep, genital     Status:  Abnormal   Collection Time: 06/27/18  4:39 PM  Result Value Ref Range   Yeast Wet Prep HPF POC NONE SEEN NONE SEEN   Trich, Wet Prep NONE SEEN NONE SEEN   Clue Cells Wet Prep HPF POC NONE SEEN NONE SEEN   WBC, Wet Prep HPF POC MODERATE (A) NONE SEEN   Sperm NONE SEEN      MAU Course  Procedures  MDM   Assessment and Plan   1. Pain of round ligament affecting pregnancy, antepartum   2. Supervision of normal first pregnancy, antepartum   3. [redacted] weeks gestation of pregnancy    DC home Comfort measures reviewed  2nd/3rd Trimester precautions  Bleeding precautions PTL precautions  Fetal kick counts RX: none  Return to MAU as needed FU with OB as planned  Follow-up Information    [facility] Follow up.   Specialty:  Obstetrics and Gynecology Contact information: [address] [place] [address] [phone]          Thressa Sheller DNP, CNM  06/27/18  4:33 PM

## 2018-06-27 NOTE — MAU Note (Signed)
Patient present to MAU with left sided lower abdominal pain she rates a 3/10.  It started last night and went from left side, to all over abdomen, now back to left side.  Denies vaginal bleeding.  Reports white discharge.

## 2018-06-30 LAB — GC/CHLAMYDIA PROBE AMP (~~LOC~~) NOT AT ARMC
CHLAMYDIA, DNA PROBE: NEGATIVE
NEISSERIA GONORRHEA: NEGATIVE

## 2018-07-11 ENCOUNTER — Encounter: Payer: Self-pay | Admitting: *Deleted

## 2018-07-11 ENCOUNTER — Encounter: Payer: Self-pay | Admitting: Family Medicine

## 2018-07-11 ENCOUNTER — Ambulatory Visit (INDEPENDENT_AMBULATORY_CARE_PROVIDER_SITE_OTHER): Payer: BC Managed Care – PPO | Admitting: Family Medicine

## 2018-07-11 VITALS — BP 123/75 | HR 89 | Wt 162.0 lb

## 2018-07-11 DIAGNOSIS — O26891 Other specified pregnancy related conditions, first trimester: Secondary | ICD-10-CM

## 2018-07-11 DIAGNOSIS — Z3401 Encounter for supervision of normal first pregnancy, first trimester: Secondary | ICD-10-CM

## 2018-07-11 DIAGNOSIS — Z3A18 18 weeks gestation of pregnancy: Secondary | ICD-10-CM

## 2018-07-11 DIAGNOSIS — Z6791 Unspecified blood type, Rh negative: Secondary | ICD-10-CM

## 2018-07-11 DIAGNOSIS — Z3400 Encounter for supervision of normal first pregnancy, unspecified trimester: Secondary | ICD-10-CM

## 2018-07-11 NOTE — Progress Notes (Signed)
   PRENATAL VISIT NOTE  Subjective:  Lindsay Phillips is a 23 y.o. G2P0010 at [redacted]w[redacted]d being seen today for ongoing prenatal care.  She is currently monitored for the following issues for this low-risk pregnancy and has Supervision of normal first pregnancy, antepartum and Rh negative status during pregnancy on their problem list.  Patient reports no complaints.  Contractions: Not present. Vag. Bleeding: None.  Movement: Present. Denies leaking of fluid.   The following portions of the patient's history were reviewed and updated as appropriate: allergies, current medications, past family history, past medical history, past social history, past surgical history and problem list. Problem list updated.  Objective:   Vitals:   07/11/18 0818  BP: 123/75  Pulse: 89  Weight: 162 lb (73.5 kg)    Fetal Status: Fetal Heart Rate (bpm): 150   Movement: Present     General:  Alert, oriented and cooperative. Patient is in no acute distress.  Skin: Skin is warm and dry. No rash noted.   Cardiovascular: Normal heart rate noted  Respiratory: Normal respiratory effort, no problems with respiration noted  Abdomen: Soft, gravid, appropriate for gestational age.  Pain/Pressure: Present     Pelvic: Cervical exam deferred        Extremities: Normal range of motion.  Edema: None  Mental Status: Normal mood and affect. Normal behavior. Normal judgment and thought content.   Assessment and Plan:  Pregnancy: G2P0010 at [redacted]w[redacted]d  1. Supervision of normal first pregnancy, antepartum FHT and FH normal  2. Rh negative status during pregnancy in first trimester   Preterm labor symptoms and general obstetric precautions including but not limited to vaginal bleeding, contractions, leaking of fluid and fetal movement were reviewed in detail with the patient. Please refer to After Visit Summary for other counseling recommendations.  No follow-ups on file.  Future Appointments  Date Time Provider Department Center    07/25/2018  7:45 AM [facility] [place] [facility]    Levie Heritage, DO

## 2018-07-16 ENCOUNTER — Telehealth: Payer: Self-pay

## 2018-07-16 NOTE — Telephone Encounter (Signed)
Patient called stating she has random episodes of hot flushes. Patient states that she stopped taking her nausea medication a  Few days ago and she has not had anymore nausea.  Patient states sometime she has to go outside to cool off. Patient advised to try and eat small meals throughout the day to try and have a balanced blood sugar. Also made her aware that hormones could be playing a factor in her hot flushes. Patient to try and eat small meals throughout the day and see if this makes some improvements. Armandina Stammer RN

## 2018-07-25 ENCOUNTER — Encounter (HOSPITAL_COMMUNITY): Payer: Self-pay

## 2018-07-25 ENCOUNTER — Ambulatory Visit (HOSPITAL_COMMUNITY): Payer: BC Managed Care – PPO | Admitting: *Deleted

## 2018-07-25 ENCOUNTER — Ambulatory Visit (HOSPITAL_COMMUNITY)
Admission: RE | Admit: 2018-07-25 | Discharge: 2018-07-25 | Disposition: A | Discharge status: Home / Self Care | Payer: BC Managed Care – PPO | Source: Ambulatory Visit | Attending: Advanced Practice Midwife | Admitting: Advanced Practice Midwife

## 2018-07-25 ENCOUNTER — Other Ambulatory Visit (HOSPITAL_COMMUNITY): Payer: Self-pay | Admitting: *Deleted

## 2018-07-25 ENCOUNTER — Other Ambulatory Visit: Payer: Self-pay | Admitting: Advanced Practice Midwife

## 2018-07-25 VITALS — BP 111/72 | HR 89 | Wt 167.0 lb

## 2018-07-25 DIAGNOSIS — Z3A20 20 weeks gestation of pregnancy: Secondary | ICD-10-CM

## 2018-07-25 DIAGNOSIS — O26891 Other specified pregnancy related conditions, first trimester: Secondary | ICD-10-CM | POA: Insufficient documentation

## 2018-07-25 DIAGNOSIS — Z6791 Unspecified blood type, Rh negative: Secondary | ICD-10-CM

## 2018-07-25 DIAGNOSIS — Z3400 Encounter for supervision of normal first pregnancy, unspecified trimester: Secondary | ICD-10-CM | POA: Insufficient documentation

## 2018-07-25 DIAGNOSIS — O360120 Maternal care for anti-D [Rh] antibodies, second trimester, not applicable or unspecified: Secondary | ICD-10-CM | POA: Insufficient documentation

## 2018-07-25 DIAGNOSIS — Z363 Encounter for antenatal screening for malformations: Secondary | ICD-10-CM | POA: Diagnosis not present

## 2018-07-25 DIAGNOSIS — Z362 Encounter for other antenatal screening follow-up: Secondary | ICD-10-CM

## 2018-07-25 NOTE — Progress Notes (Unsigned)
.  mfm

## 2018-07-27 ENCOUNTER — Encounter: Payer: Self-pay | Admitting: Advanced Practice Midwife

## 2018-07-29 ENCOUNTER — Other Ambulatory Visit (HOSPITAL_COMMUNITY): Payer: Self-pay | Admitting: Advanced Practice Midwife

## 2018-08-06 ENCOUNTER — Encounter: Payer: Self-pay | Admitting: Advanced Practice Midwife

## 2018-08-08 ENCOUNTER — Encounter: Payer: BC Managed Care – PPO | Admitting: Family Medicine

## 2018-08-08 ENCOUNTER — Ambulatory Visit (INDEPENDENT_AMBULATORY_CARE_PROVIDER_SITE_OTHER): Payer: BC Managed Care – PPO | Admitting: Family Medicine

## 2018-08-08 ENCOUNTER — Other Ambulatory Visit: Payer: Self-pay

## 2018-08-08 VITALS — BP 109/64 | HR 89 | Wt 169.0 lb

## 2018-08-08 DIAGNOSIS — Z3400 Encounter for supervision of normal first pregnancy, unspecified trimester: Secondary | ICD-10-CM

## 2018-08-08 DIAGNOSIS — O26891 Other specified pregnancy related conditions, first trimester: Secondary | ICD-10-CM

## 2018-08-08 DIAGNOSIS — Z3A22 22 weeks gestation of pregnancy: Secondary | ICD-10-CM

## 2018-08-08 DIAGNOSIS — O360922 Maternal care for other rhesus isoimmunization, second trimester, fetus 2: Secondary | ICD-10-CM

## 2018-08-08 DIAGNOSIS — Z6791 Unspecified blood type, Rh negative: Secondary | ICD-10-CM

## 2018-08-08 NOTE — Progress Notes (Signed)
   PRENATAL VISIT NOTE  Subjective:  Lindsay Phillips is a 23 y.o. G2P0010 at [redacted]w[redacted]d being seen today for ongoing prenatal care.  She is currently monitored for the following issues for this low-risk pregnancy and has Supervision of normal first pregnancy, antepartum and Rh negative status during pregnancy on their problem list.  Patient reports no complaints. Having runny and sore throat. No cough or fever. Contractions: Not present. Vag. Bleeding: None.  Movement: Present. Denies leaking of fluid.   The following portions of the patient's history were reviewed and updated as appropriate: allergies, current medications, past family history, past medical history, past social history, past surgical history and problem list.   Objective:   Vitals:   08/08/18 0909  BP: 109/64  Pulse: 89  Weight: 169 lb (76.7 kg)    Fetal Status: Fetal Heart Rate (bpm): 159 Fundal Height: 22 cm Movement: Present     General:  Alert, oriented and cooperative. Patient is in no acute distress.  Skin: Skin is warm and dry. No rash noted.   Cardiovascular: Normal heart rate noted  Respiratory: Normal respiratory effort, no problems with respiration noted  Abdomen: Soft, gravid, appropriate for gestational age.  Pain/Pressure: Present     Pelvic: Cervical exam deferred        Extremities: Normal range of motion.  Edema: Trace  Mental Status: Normal mood and affect. Normal behavior. Normal judgment and thought content.   Assessment and Plan:  Pregnancy: G2P0010 at [redacted]w[redacted]d 1. Supervision of normal first pregnancy, antepartum FHT and FH normal. Discussed COVID-19, signs and sxs to watch for.  2. Rh negative status during pregnancy in first trimester Rhogam next visit.  Preterm labor symptoms and general obstetric precautions including but not limited to vaginal bleeding, contractions, leaking of fluid and fetal movement were reviewed in detail with the patient. Please refer to After Visit Summary for other  counseling recommendations.   Return in about 4 weeks (around 09/05/2018).  Future Appointments  Date Time Provider Department Center  08/26/2018  7:30 AM [facility] NURSE [facility]  08/26/2018  7:45 AM [facility] [place] 2 [facility]    Levie Heritage, DO

## 2018-08-12 ENCOUNTER — Telehealth: Payer: Self-pay

## 2018-08-12 NOTE — Telephone Encounter (Signed)
Pt called the office stating that she has been coughing up green mucus x 4 days. Pt states that she is also congested but denies fever. Advised pt to take Mucinex, sudafed, and use saline nasal spray. Pt also advised to drink plenty of water and to call the office if she starts to have a fever. Understanding was voiced.  Lindsay Phillips, CMA

## 2018-08-22 ENCOUNTER — Telehealth: Payer: Self-pay

## 2018-08-22 NOTE — Telephone Encounter (Signed)
I spoke with patient on the phone - discussed symptoms (loose cough with elevated temp of 99, some nasal congestion). Denies SOB, cyanosis, high fevers. Currently, recommendation is not to test those with mild symptoms. Discussed symptomatic treatment, isolation for 14 days and fever free for 3 days. Discussed when to call or seek emergent care, including SOB, cyanosis, or inability to stay hydrated. Will follow up with patient next week.

## 2018-08-22 NOTE — Telephone Encounter (Signed)
Patient called stating that she thinks she may have been exposed to someone with COViD 19. Patient states that she has a cough and a slight temperature of 66F.  Patient made aware we will keep her next ob visit via telehealth and delay her gtt.  Patient transferred to speak with Dr. Adrian Blackwater.

## 2018-08-25 ENCOUNTER — Encounter: Payer: Self-pay | Admitting: Advanced Practice Midwife

## 2018-08-25 ENCOUNTER — Telehealth: Payer: Self-pay

## 2018-08-25 NOTE — Telephone Encounter (Signed)
Patient called stating that she is feeling better in regards to concerns of COVID 19.  Patient states that she has one day where is saw stars when she got up and down throughout the weekend. Patient states that she also had some swelling and headaches. Patient states that is only lasted one day. Patient states that she had some headache on Sunday. Patient does not have access to bp cuff at this time.   Patient states baby is moving well and no bleeding.  Patient also states that she is feeling much better today with no symptoms but wanted [place] to be aware.  Patient made aware I will route to provider. Armandina Stammer RN

## 2018-08-26 ENCOUNTER — Ambulatory Visit (HOSPITAL_COMMUNITY): Payer: BC Managed Care – PPO

## 2018-08-28 ENCOUNTER — Other Ambulatory Visit: Payer: Self-pay

## 2018-08-28 ENCOUNTER — Telehealth: Payer: Self-pay

## 2018-08-28 ENCOUNTER — Encounter: Payer: Self-pay | Admitting: Advanced Practice Midwife

## 2018-08-28 ENCOUNTER — Encounter (HOSPITAL_COMMUNITY): Payer: Self-pay | Admitting: *Deleted

## 2018-08-28 ENCOUNTER — Inpatient Hospital Stay (HOSPITAL_COMMUNITY)
Admission: AD | Admit: 2018-08-28 | Discharge: 2018-08-28 | Disposition: A | Discharge status: Home / Self Care | Payer: BC Managed Care – PPO | Attending: Obstetrics & Gynecology | Admitting: Obstetrics & Gynecology

## 2018-08-28 DIAGNOSIS — O26892 Other specified pregnancy related conditions, second trimester: Secondary | ICD-10-CM

## 2018-08-28 DIAGNOSIS — O368121 Decreased fetal movements, second trimester, fetus 1: Secondary | ICD-10-CM | POA: Diagnosis not present

## 2018-08-28 DIAGNOSIS — O368120 Decreased fetal movements, second trimester, not applicable or unspecified: Secondary | ICD-10-CM | POA: Diagnosis not present

## 2018-08-28 DIAGNOSIS — N939 Abnormal uterine and vaginal bleeding, unspecified: Secondary | ICD-10-CM

## 2018-08-28 DIAGNOSIS — Z79899 Other long term (current) drug therapy: Secondary | ICD-10-CM | POA: Insufficient documentation

## 2018-08-28 DIAGNOSIS — Z3A25 25 weeks gestation of pregnancy: Secondary | ICD-10-CM

## 2018-08-28 DIAGNOSIS — O9989 Other specified diseases and conditions complicating pregnancy, childbirth and the puerperium: Secondary | ICD-10-CM | POA: Diagnosis not present

## 2018-08-28 DIAGNOSIS — O26899 Other specified pregnancy related conditions, unspecified trimester: Secondary | ICD-10-CM

## 2018-08-28 DIAGNOSIS — Z6791 Unspecified blood type, Rh negative: Secondary | ICD-10-CM | POA: Insufficient documentation

## 2018-08-28 DIAGNOSIS — O4692 Antepartum hemorrhage, unspecified, second trimester: Secondary | ICD-10-CM | POA: Diagnosis not present

## 2018-08-28 DIAGNOSIS — Z8759 Personal history of other complications of pregnancy, childbirth and the puerperium: Secondary | ICD-10-CM | POA: Insufficient documentation

## 2018-08-28 DIAGNOSIS — O360920 Maternal care for other rhesus isoimmunization, second trimester, not applicable or unspecified: Secondary | ICD-10-CM | POA: Diagnosis not present

## 2018-08-28 HISTORY — DX: Other specified health status: Z78.9

## 2018-08-28 LAB — WET PREP, GENITAL
Sperm: NONE SEEN
Trich, Wet Prep: NONE SEEN
Yeast Wet Prep HPF POC: NONE SEEN

## 2018-08-28 LAB — URINALYSIS, ROUTINE W REFLEX MICROSCOPIC
Bilirubin Urine: NEGATIVE
Glucose, UA: 50 mg/dL — AB
Hgb urine dipstick: NEGATIVE
Ketones, ur: NEGATIVE mg/dL
Nitrite: NEGATIVE
Protein, ur: NEGATIVE mg/dL
Specific Gravity, Urine: 1.009 (ref 1.005–1.030)
pH: 8 (ref 5.0–8.0)

## 2018-08-28 LAB — FETAL FIBRONECTIN: Fetal Fibronectin: NEGATIVE

## 2018-08-28 MED ORDER — RHO D IMMUNE GLOBULIN 1500 UNIT/2ML IJ SOSY
300.0000 ug | PREFILLED_SYRINGE | Freq: Once | INTRAMUSCULAR | Status: AC
  Administered 2018-08-28: 300 ug via INTRAMUSCULAR
  Filled 2018-08-28: qty 2

## 2018-08-28 NOTE — MAU Provider Note (Addendum)
History     CSN: [id number]  Arrival date and time: 08/28/18 1024   First Provider Initiated Contact with Patient 08/28/18 1104      Chief Complaint  Patient presents with  . Decreased Fetal Movement   Ms. Lindsay Phillips is a 23 y.o. G2P0010 at [redacted]w[redacted]d who presents to MAU for decreased fetal movement x2-3days. Pt reports the baby usually wakes her up in the morning with movement, but the past couple of days she has felt no movement all day and only felt movement once each night. Pt reports cramping at home, daily, about 5 times per day with concurrent abdominal tightening. Pt denies adequately hydrating during pregnancy and reports urinating only 5-6 times daily.  Pt denies VB, LOF, vaginal discharge/odor/itching. Pt denies N/V, abdominal pain, constipation, diarrhea, or urinary problems. Pt denies fever, chills, fatigue, sweating or changes in appetite. Pt denies SOB or chest pain. Pt denies dizziness, HA, light-headedness, weakness.  Problems this pregnancy include: RH NEGATIVE. Allergies? NKDA Current medications/supplements? PNVs Prenatal care provider? [facility], next appt 09/04/2018  Pt reports she had a respiratory illness with runny nose and cough, possible fever, but sx stopped 2weeks ago.  Pt denies anything in the vagina in the last 24hrs.   OB History    Gravida  2   Para      Term      Preterm      AB  1   Living        SAB      TAB  1   Ectopic      Multiple      Live Births              Past Medical History:  Diagnosis Date  . Medical history non-contributory     Past Surgical History:  Procedure Laterality Date  . INDUCED ABORTION      Family History  Problem Relation Age of Onset  . Healthy Mother   . Healthy Father     Social History   Tobacco Use  . Smoking status: Never Smoker  . Smokeless tobacco: Never Used  Substance Use Topics  . Alcohol use: Not Currently    Frequency: Never    Comment: not in pregnancy  . Drug  use: Never    Allergies: No Known Allergies  Medications Prior to Admission  Medication Sig Dispense Refill Last Dose  . Omega-3 Fatty Acids (FISH OIL) 1000 MG CAPS Take 1 capsule by mouth daily.   08/27/2018 at Unknown time  . Prenatal Vit-Fe Fumarate-FA (PRENATAL MULTIVITAMIN) TABS tablet Take 1 tablet by mouth daily at 12 noon.   08/27/2018 at Unknown time  . Doxylamine-Pyridoxine (DICLEGIS) 10-10 MG TBEC Take 2 tablets by mouth at bedtime as needed. (Patient not taking: Reported on 07/25/2018) 100 tablet 5 Not Taking  . PE-diphenhydrAMINE-DM-GG-APAP (DELSYM DAY NIGHT PO) Take by mouth.   Taking    Review of Systems  Constitutional: Negative for chills, diaphoresis, fatigue and fever.  Respiratory: Negative for shortness of breath.   Cardiovascular: Negative for chest pain.  Gastrointestinal: Negative for abdominal pain, constipation, diarrhea, nausea and vomiting.  Genitourinary: Positive for pelvic pain (cramping). Negative for difficulty urinating, dysuria, flank pain, frequency, urgency, vaginal bleeding and vaginal discharge.  Neurological: Negative for dizziness, weakness, light-headedness and headaches.   Physical Exam   Blood pressure 106/60, pulse 87, temperature 98.2 F (36.8 C), temperature source Oral, resp. rate 16, height 5\' 5"  (1.651 m), weight 78 kg, last menstrual period 03/05/2018,  SpO2 100 %.   Patient Vitals for the past 24 hrs:  BP Temp Temp src Pulse Resp SpO2 Height Weight  08/28/18 1422 106/60 98.2 F (36.8 C) Oral 87 16 100 % - -  08/28/18 1100 - - - - - 100 % - -  08/28/18 1044 121/63 97.9 F (36.6 C) - 87 18 100 % 5\' 5"  (1.651 m) 78 kg    Physical Exam  Constitutional: She is oriented to person, place, and time. She appears well-developed and well-nourished. No distress.  HENT:  Head: Normocephalic and atraumatic.  Respiratory: Effort normal.  GI: Soft. She exhibits no distension and no mass. There is no abdominal tenderness. There is no rebound and no  guarding.  Genitourinary:    Vagina normal.  There is no rash, tenderness, lesion or injury on the right labia. There is no rash, tenderness, lesion or injury on the left labia. Cervix exhibits friability.    No vaginal discharge or bleeding.  No bleeding in the vagina.    Genitourinary Comments: CE: long/closed/posterior, but on withdrawal of glove, mucus-like discharge spotted with blood noted.  On speculum exam   Neurological: She is alert and oriented to person, place, and time.  Skin: Skin is warm and dry. She is not diaphoretic.  Psychiatric: She has a normal mood and affect.   Results for orders placed or performed during the hospital encounter of 08/28/18 (from the past 24 hour(s))  Urinalysis, Routine w reflex microscopic     Status: Abnormal   Collection Time: 08/28/18 11:07 AM  Result Value Ref Range   Color, Urine YELLOW YELLOW   APPearance HAZY (A) CLEAR   Specific Gravity, Urine 1.009 1.005 - 1.030   pH 8.0 5.0 - 8.0   Glucose, UA 50 (A) NEGATIVE mg/dL   Hgb urine dipstick NEGATIVE NEGATIVE   Bilirubin Urine NEGATIVE NEGATIVE   Ketones, ur NEGATIVE NEGATIVE mg/dL   Protein, ur NEGATIVE NEGATIVE mg/dL   Nitrite NEGATIVE NEGATIVE   Leukocytes,Ua SMALL (A) NEGATIVE   RBC / HPF 0-5 0 - 5 RBC/hpf   WBC, UA 0-5 0 - 5 WBC/hpf   Bacteria, UA FEW (A) NONE SEEN   Squamous Epithelial / LPF 11-20 0 - 5  Fetal fibronectin     Status: None   Collection Time: 08/28/18 11:44 AM  Result Value Ref Range   Fetal Fibronectin NEGATIVE NEGATIVE  Wet prep, genital     Status: Abnormal   Collection Time: 08/28/18 11:44 AM  Result Value Ref Range   Yeast Wet Prep HPF POC NONE SEEN NONE SEEN   Trich, Wet Prep NONE SEEN NONE SEEN   Clue Cells Wet Prep HPF POC PRESENT (A) NONE SEEN   WBC, Wet Prep HPF POC MANY (A) NONE SEEN   Sperm NONE SEEN   Rh IG workup (includes ABO/Rh)     Status: None (Preliminary result)   Collection Time: 08/28/18 12:25 PM  Result Value Ref Range   Gestational  Age(Wks) 25    ABO/RH(D) O NEG    Antibody Screen NEG    Fetal Screen NEG    Unit Number [id number]    Blood Component Type RHIG    Unit division 00    Status of Unit ISSUED    Transfusion Status      OK TO TRANSFUSE Performed at [facility], [address] [place], [place] [address]    No results found.   MAU Course  Procedures  MDM -pt here for decreased  FM, mild ctx/irritability noted on EFM -UA: hazy, glucose 50, small leukocytes, few bacteria, sending for urine culture -no evidence of dehydration -fFN performed -cervix long/closed/posterior on bimanual exam, but mucus-like discharge with blood noted on gloves, fFN sent --speculum exam performed, cervix friable on contact with sample collection device for GC/CT/WetPrep, stopped quickly with pressure on external os, source of bleeding not visible, but coming from os -d/t bleeding on exam, RhoGAM work-up performed and RhoGAM given -fFN: negative -WetPrep: +ClueCells (isolated finding, no BV tx needed), +WBCs -GC/CT performed -EFM baseline 145-150, mod variability, pos accels (10x10, later 15x15), variable decels. TOCO irritability/mild ctx. Pt denies feeling any ctx while in MAU. -pt denies any bleeding while in MAU apart from bleeding noted on exam -pt denies any pelvic/abdominal pain at time of discharge -pt reports normal fetal movement while in MAU -pt discharged to home in stable condition  Orders Placed This Encounter  Procedures  . Wet prep, genital    Standing Status:   Standing    Number of Occurrences:   1  . Culture, OB Urine    Standing Status:   Standing    Number of Occurrences:   1  . Culture, OB Urine    Standing Status:   Standing    Number of Occurrences:   1  . Urinalysis, Routine w reflex microscopic    Standing Status:   Standing    Number of Occurrences:   1  . Fetal fibronectin    Standing Status:   Standing    Number of Occurrences:   1  . Rh IG workup (includes ABO/Rh)     Standing Status:   Standing    Number of Occurrences:   1    Order Specific Question:   Weeks of Gestation    Answer:   22  . Discharge patient    Order Specific Question:   Discharge disposition    Answer:   01-Home or Self Care [1]    Order Specific Question:   Discharge patient date    Answer:   08/28/2018   Meds ordered this encounter  Medications  . rho (d) immune globulin (RHIG/RHOPHYLAC) injection 300 mcg    Assessment and Plan   1. Decreased fetal movements in second trimester, fetus 1 of multiple gestation   2. Vaginal bleeding   3. Rh negative state in antepartum period    Allergies as of 08/28/2018   No Known Allergies     Medication List    STOP taking these medications   DELSYM DAY NIGHT PO   Fish Oil 1000 MG Caps     TAKE these medications   Doxylamine-Pyridoxine 10-10 MG Tbec Commonly known as:  Diclegis Take 2 tablets by mouth at bedtime as needed.   prenatal multivitamin Tabs tablet Take 1 tablet by mouth daily at 12 noon.      -discussed appropriate hydration in pregnancy and mild s/sx of dehydration -will call with culture results, if requiring treatment -discussed normal fetal movement expectations in pregnancy -bleeding/PTL/DFM/return MAU precautions reviewed -pt discharged to home in stable condition -f/u with OB as scheduled  Odie Sera  08/28/2018, 2:32 PM

## 2018-08-28 NOTE — Telephone Encounter (Signed)
Pt sent message via Mychart stating that her baby has started moving less. Pt states that she felt baby move once last night before bed but  hasn't felt him move at all this morning. Advised pt to go to [facility]. Understanding was voiced. Jemari Hallum l Lois Slagel, CMA

## 2018-08-28 NOTE — Discharge Instructions (Signed)
Preterm Labor and Birth Information ° °The normal length of a pregnancy is 39-41 weeks. Preterm labor is when labor starts before 37 completed weeks of pregnancy. °What are the risk factors for preterm labor? °Preterm labor is more likely to occur in women who: °· Have certain infections during pregnancy such as a bladder infection, sexually transmitted infection, or infection inside the uterus (chorioamnionitis). °· Have a shorter-than-normal cervix. °· Have gone into preterm labor before. °· Have had surgery on their cervix. °· Are younger than age 17 or older than age 35. °· Are African American. °· Are pregnant with twins or multiple babies (multiple gestation). °· Take street drugs or smoke while pregnant. °· Do not gain enough weight while pregnant. °· Became pregnant shortly after having been pregnant. °What are the symptoms of preterm labor? °Symptoms of preterm labor include: °· Cramps similar to those that can happen during a menstrual period. The cramps may happen with diarrhea. °· Pain in the abdomen or lower back. °· Regular uterine contractions that may feel like tightening of the abdomen. °· A feeling of increased pressure in the pelvis. °· Increased watery or bloody mucus discharge from the vagina. °· Water breaking (ruptured amniotic sac). °Why is it important to recognize signs of preterm labor? °It is important to recognize signs of preterm labor because babies who are born prematurely may not be fully developed. This can put them at an increased risk for: °· Long-term (chronic) heart and lung problems. °· Difficulty immediately after birth with regulating body systems, including blood sugar, body temperature, heart rate, and breathing rate. °· Bleeding in the brain. °· Cerebral palsy. °· Learning difficulties. °· Death. °These risks are highest for babies who are born before 34 weeks of pregnancy. °How is preterm labor treated? °Treatment depends on the length of your pregnancy, your condition,  and the health of your baby. It may involve: °· Having a stitch (suture) placed in your cervix to prevent your cervix from opening too early (cerclage). °· Taking or being given medicines, such as: °? Hormone medicines. These may be given early in pregnancy to help support the pregnancy. °? Medicine to stop contractions. °? Medicines to help mature the baby’s lungs. These may be prescribed if the risk of delivery is high. °? Medicines to prevent your baby from developing cerebral palsy. °If the labor happens before 34 weeks of pregnancy, you may need to stay in the hospital. °What should I do if I think I am in preterm labor? °If you think that you are going into preterm labor, call your health care provider right away. °How can I prevent preterm labor in future pregnancies? °To increase your chance of having a full-term pregnancy: °· Do not use any tobacco products, such as cigarettes, chewing tobacco, and e-cigarettes. If you need help quitting, ask your health care provider. °· Do not use street drugs or medicines that have not been prescribed to you during your pregnancy. °· Talk with your health care provider before taking any herbal supplements, even if you have been taking them regularly. °· Make sure you gain a healthy amount of weight during your pregnancy. °· Watch for infection. If you think that you might have an infection, get it checked right away. °· Make sure to tell your health care provider if you have gone into preterm labor before. °This information is not intended to replace advice given to you by your health care provider. Make sure you discuss any questions you have with your   health care provider. Document Released: 08/04/2003 Document Revised: 10/25/2015 Document Reviewed: 10/05/2015 Elsevier Interactive Patient Education  2019 [employer] Vaginal Bleeding During Pregnancy, Second Trimester  A small amount of bleeding (spotting) from the vagina is relatively common during pregnancy.  It usually stops on its own. Various things can cause spotting during pregnancy. Sometimes the bleeding is normal and is not a sign of a problem in the pregnancy. However, bleeding can also be a sign of something serious. Be sure to tell your health care provider about any vaginal bleeding right away. Some possible causes of vaginal bleeding during the second trimester include:  Infection, inflammation, or growths (polyps) on the cervix.  A condition in which the placenta partially or completely covers the opening of the cervix inside the uterus (placenta previa).  The placenta separating from the uterus (placenta abruption).  Early (preterm) labor.  The cervix opening and thinning before pregnancy is at term and before labor starts (cervical insufficiency).  A mass of tissue developing in the uterus due to an egg being fertilized incorrectly (molar pregnancy). Follow these instructions at home: Activity  Follow instructions from your health care provider about limiting your activity. Ask what activities are safe for you.  If needed, make plans for someone to help with your regular activities.  Do not exercise or do activities that take a lot of effort unless your health care provider approves.  Do not lift anything that is heavier than 10 lb (4.5 kg), or the limit that your health care provider tells you, until he or she says that it is safe.  Do not have sex or orgasms until your health care provider says that this is safe. Medicines  Take over-the-counter and prescription medicines only as told by your health care provider.  Do not take aspirin because it can cause bleeding. General instructions  Pay attention to any changes in your symptoms.  Write down how many pads you use each day, how often you change pads, and how soaked (saturated) they are.  Do not use tampons or douche.  If you pass any tissue from your vagina, save the tissue so you can show it to your health care  provider.  Keep all follow-up visits as told by your health care provider. This is important. Contact a health care provider if:  You have vaginal bleeding during any time of your pregnancy.  You have cramps or labor pains.  You have a fever that does not get better when you take medicines. Get help right away if:  You have severe cramps in your back or abdomen.  You have contractions.  You have chills.  You pass large clots or a large amount of tissue from your vagina.  Your bleeding increases.  You feel light-headed or weak, or you faint.  You are leaking fluid or have a gush of fluid from your vagina. Summary  Various things can cause bleeding or spotting in pregnancy.  Be sure to tell your health care provider about any vaginal bleeding right away.  Follow instructions from your health care provider about limiting your activity. Ask what activities are safe for you. This information is not intended to replace advice given to you by your health care provider. Make sure you discuss any questions you have with your health care provider. Document Released: 02/21/2005 Document Revised: 08/16/2016 Document Reviewed: 08/16/2016 Elsevier Interactive Patient Education  2019 [employer]. Fetal Movement Counts Patient Name: ________________________________________________ Patient Due Date: ____________________ What is a fetal movement  count?  A fetal movement count is the number of times that you feel your baby move during a certain amount of time. This may also be called a fetal kick count. A fetal movement count is recommended for every pregnant woman. You may be asked to start counting fetal movements as early as week 28 of your pregnancy. Pay attention to when your baby is most active. You may notice your baby's sleep and wake cycles. You may also notice things that make your baby move more. You should do a fetal movement count:  When your baby is normally most active.  At  the same time each day. A good time to count movements is while you are resting, after having something to eat and drink. How do I count fetal movements? 1. Find a quiet, comfortable area. Sit, or lie down on your side. 2. Write down the date, the start time and stop time, and the number of movements that you felt between those two times. Take this information with you to your health care visits. 3. For 2 hours, count kicks, flutters, swishes, rolls, and jabs. You should feel at least 10 movements during 2 hours. 4. You may stop counting after you have felt 10 movements. 5. If you do not feel 10 movements in 2 hours, have something to eat and drink. Then, keep resting and counting for 1 hour. If you feel at least 4 movements during that hour, you may stop counting. Contact a health care provider if:  You feel fewer than 4 movements in 2 hours.  Your baby is not moving like he or she usually does. Date: ____________ Start time: ____________ Stop time: ____________ Movements: ____________ Date: ____________ Start time: ____________ Stop time: ____________ Movements: ____________ Date: ____________ Start time: ____________ Stop time: ____________ Movements: ____________ Date: ____________ Start time: ____________ Stop time: ____________ Movements: ____________ Date: ____________ Start time: ____________ Stop time: ____________ Movements: ____________ Date: ____________ Start time: ____________ Stop time: ____________ Movements: ____________ Date: ____________ Start time: ____________ Stop time: ____________ Movements: ____________ Date: ____________ Start time: ____________ Stop time: ____________ Movements: ____________ Date: ____________ Start time: ____________ Stop time: ____________ Movements: ____________ This information is not intended to replace advice given to you by your health care provider. Make sure you discuss any questions you have with your health care provider. Document  Released: 06/13/2006 Document Revised: 01/11/2016 Document Reviewed: 06/23/2015 Elsevier Interactive Patient Education  2019 [employer].

## 2018-08-28 NOTE — MAU Note (Signed)
Pt presents to MAU with a decrease in fetal movement for 2 days. Denies any vaginal bleeding or abnormal discharge

## 2018-08-29 LAB — CULTURE, OB URINE: Culture: >=100000 — AB

## 2018-08-29 LAB — GC/CHLAMYDIA PROBE AMP (~~LOC~~) NOT AT ARMC
Chlamydia: NEGATIVE
Neisseria Gonorrhea: NEGATIVE

## 2018-08-29 LAB — RH IG WORKUP (INCLUDES ABO/RH)
ABO/RH(D): O NEG
Antibody Screen: NEGATIVE
Fetal Screen: NEGATIVE
Gestational Age(Wks): 25
Unit division: 0

## 2018-09-04 ENCOUNTER — Ambulatory Visit (INDEPENDENT_AMBULATORY_CARE_PROVIDER_SITE_OTHER): Payer: BC Managed Care – PPO | Admitting: Family Medicine

## 2018-09-04 DIAGNOSIS — Z3400 Encounter for supervision of normal first pregnancy, unspecified trimester: Secondary | ICD-10-CM

## 2018-09-04 DIAGNOSIS — Z3A26 26 weeks gestation of pregnancy: Secondary | ICD-10-CM

## 2018-09-04 DIAGNOSIS — Z6791 Unspecified blood type, Rh negative: Secondary | ICD-10-CM

## 2018-09-04 DIAGNOSIS — O26892 Other specified pregnancy related conditions, second trimester: Secondary | ICD-10-CM

## 2018-09-04 DIAGNOSIS — O26899 Other specified pregnancy related conditions, unspecified trimester: Secondary | ICD-10-CM

## 2018-09-04 DIAGNOSIS — O360920 Maternal care for other rhesus isoimmunization, second trimester, not applicable or unspecified: Secondary | ICD-10-CM

## 2018-09-04 NOTE — Progress Notes (Signed)
   TELEHEALTH VIRTUAL OBSTETRICS VISIT ENCOUNTER NOTE  I connected with Lindsay Phillips on 09/04/18 at  8:15 AM EDT by telephone at home and verified that I am speaking with the correct person using two identifiers.   I discussed the limitations, risks, security and privacy concerns of performing an evaluation and management service by telephone and the availability of in person appointments. I also discussed with the patient that there may be a patient responsible charge related to this service. The patient expressed understanding and agreed to proceed.  Subjective:  Lindsay Phillips is a 23 y.o. G2P0010 at [redacted]w[redacted]d being followed for ongoing prenatal care.  She is currently monitored for the following issues for this low-risk pregnancy and has Supervision of normal first pregnancy, antepartum and Rh negative status during pregnancy on their problem list.  Patient reports no complaints. Reports fetal movement. Denies any contractions, bleeding or leaking of fluid.   The following portions of the patient's history were reviewed and updated as appropriate: allergies, current medications, past family history, past medical history, past social history, past surgical history and problem list.   Objective:   General:  Alert, oriented and cooperative.   Mental Status: Normal mood and affect perceived. Normal judgment and thought content.  Rest of physical exam deferred due to type of encounter  Assessment and Plan:  Pregnancy: G2P0010 at [redacted]w[redacted]d  1. Supervision of normal first pregnancy, antepartum Good fetal movement. Return in 2-3 weeks for 2hr GTT.  2. Rh negative state in antepartum period Received Rhophylac in MAU 08/28/2018   Preterm labor symptoms and general obstetric precautions including but not limited to vaginal bleeding, contractions, leaking of fluid and fetal movement were reviewed in detail with the patient.  I discussed the assessment and treatment plan with the patient. The patient  was provided an opportunity to ask questions and all were answered. The patient agreed with the plan and demonstrated an understanding of the instructions. The patient was advised to call back or seek an in-person office evaluation/go to MAU at [facility] for any urgent or concerning symptoms. Please refer to After Visit Summary for other counseling recommendations.   I provided 13 minutes of non-face-to-face time during this encounter.  No follow-ups on file.  Future Appointments  Date Time Provider Department Center  09/25/2018  7:40 AM [facility]-MFC NURSE [facility]-MFC MFC-US  09/25/2018  7:45 AM WH-MFC [place] 2 WH-MFCUS MFC-US    Levie Heritage, DO Center for [employer], [facility]

## 2018-09-19 ENCOUNTER — Ambulatory Visit (INDEPENDENT_AMBULATORY_CARE_PROVIDER_SITE_OTHER): Payer: BC Managed Care – PPO | Admitting: Family Medicine

## 2018-09-19 ENCOUNTER — Encounter: Payer: Self-pay | Admitting: Family Medicine

## 2018-09-19 ENCOUNTER — Other Ambulatory Visit: Payer: Self-pay

## 2018-09-19 VITALS — BP 127/78 | HR 100 | Wt 176.0 lb

## 2018-09-19 DIAGNOSIS — Z3A28 28 weeks gestation of pregnancy: Secondary | ICD-10-CM

## 2018-09-19 DIAGNOSIS — F329 Major depressive disorder, single episode, unspecified: Secondary | ICD-10-CM

## 2018-09-19 DIAGNOSIS — Z6791 Unspecified blood type, Rh negative: Secondary | ICD-10-CM

## 2018-09-19 DIAGNOSIS — Z23 Encounter for immunization: Secondary | ICD-10-CM | POA: Diagnosis not present

## 2018-09-19 DIAGNOSIS — Z3400 Encounter for supervision of normal first pregnancy, unspecified trimester: Secondary | ICD-10-CM

## 2018-09-19 DIAGNOSIS — O26893 Other specified pregnancy related conditions, third trimester: Secondary | ICD-10-CM

## 2018-09-19 DIAGNOSIS — O99343 Other mental disorders complicating pregnancy, third trimester: Secondary | ICD-10-CM

## 2018-09-19 DIAGNOSIS — O26899 Other specified pregnancy related conditions, unspecified trimester: Secondary | ICD-10-CM

## 2018-09-19 DIAGNOSIS — F32A Depression, unspecified: Secondary | ICD-10-CM

## 2018-09-19 NOTE — Progress Notes (Signed)
   PRENATAL VISIT NOTE  Subjective:  Lindsay Phillips is a 23 y.o. G2P0010 at [redacted]w[redacted]d being seen today for ongoing prenatal care.  She is currently monitored for the following issues for this low-risk pregnancy and has Supervision of normal first pregnancy, antepartum and Rh negative status during pregnancy on their problem list.  Patient reports seeing counselor for depression in pregnancy. Thinks it is helpful.  Contractions: Not present. Vag. Bleeding: None.  Movement: Present. Denies leaking of fluid.   The following portions of the patient's history were reviewed and updated as appropriate: allergies, current medications, past family history, past medical history, past social history, past surgical history and problem list.   Objective:   Vitals:   09/19/18 0821  BP: 127/78  Pulse: 100  Weight: 176 lb (79.8 kg)    Fetal Status: Fetal Heart Rate (bpm): 145 Fundal Height: 28 cm Movement: Present     General:  Alert, oriented and cooperative. Patient is in no acute distress.  Skin: Skin is warm and dry. No rash noted.   Cardiovascular: Normal heart rate noted  Respiratory: Normal respiratory effort, no problems with respiration noted  Abdomen: Soft, gravid, appropriate for gestational age.  Pain/Pressure: Present     Pelvic: Cervical exam deferred        Extremities: Normal range of motion.  Edema: Trace  Mental Status: Normal mood and affect. Normal behavior. Normal judgment and thought content.   Assessment and Plan:  Pregnancy: G2P0010 at [redacted]w[redacted]d 1. Supervision of normal first pregnancy, antepartum FHT and FH normal - Glucose Tolerance, 2 Hours w/1 Hour - CBC - HIV antibody (with reflex) - RPR  2. Rh negative state in antepartum period Received Rhogam in MAU on 4/2.   3. 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 Please refer to After Visit Summary for other counseling recommendations.   Return in about 4 weeks (around 10/17/2018) for OB f/u, WebEx.  Future Appointments  Date Time Provider Department Center  09/25/2018  7:40 AM [facility]-MFC NURSE WH-MFC MFC-US  09/25/2018  7:45 AM WH-MFC [place] 2 WH-MFCUS MFC-US  10/17/2018  8:15 AM Levie Heritage, DO [facility] None    Levie Heritage, DO

## 2018-09-19 NOTE — Progress Notes (Signed)
Patient prepared to go 28 week labs today.  Patient received Rhogam on 08/28/2018 in MAU. Armandina Stammer RN

## 2018-09-20 LAB — GLUCOSE TOLERANCE, 2 HOURS W/ 1HR
Glucose, 1 hour: 79 mg/dL (ref 65–179)
Glucose, 2 hour: 87 mg/dL (ref 65–152)
Glucose, Fasting: 72 mg/dL (ref 65–91)

## 2018-09-20 LAB — CBC
Hematocrit: 32.4 % — ABNORMAL LOW (ref 34.0–46.6)
Hemoglobin: 10.6 g/dL — ABNORMAL LOW (ref 11.1–15.9)
MCH: 27.8 pg (ref 26.6–33.0)
MCHC: 32.7 g/dL (ref 31.5–35.7)
MCV: 85 fL (ref 79–97)
Platelets: 158 10*3/uL (ref 150–450)
RBC: 3.81 x10E6/uL (ref 3.77–5.28)
RDW: 13.1 % (ref 11.7–15.4)
WBC: 9.9 10*3/uL (ref 3.4–10.8)

## 2018-09-20 LAB — HIV ANTIBODY (ROUTINE TESTING W REFLEX): HIV Screen 4th Generation wRfx: NONREACTIVE

## 2018-09-20 LAB — RPR: RPR Ser Ql: NONREACTIVE

## 2018-09-25 ENCOUNTER — Ambulatory Visit (HOSPITAL_COMMUNITY): Payer: BC Managed Care – PPO | Admitting: *Deleted

## 2018-09-25 ENCOUNTER — Encounter (HOSPITAL_COMMUNITY): Payer: Self-pay

## 2018-09-25 ENCOUNTER — Ambulatory Visit (HOSPITAL_COMMUNITY)
Admission: RE | Admit: 2018-09-25 | Discharge: 2018-09-25 | Disposition: A | Discharge status: Home / Self Care | Payer: BC Managed Care – PPO | Source: Ambulatory Visit | Attending: Obstetrics and Gynecology | Admitting: Obstetrics and Gynecology

## 2018-09-25 ENCOUNTER — Telehealth: Payer: Self-pay

## 2018-09-25 ENCOUNTER — Other Ambulatory Visit: Payer: Self-pay

## 2018-09-25 VITALS — Temp 98.0°F

## 2018-09-25 DIAGNOSIS — Z362 Encounter for other antenatal screening follow-up: Secondary | ICD-10-CM | POA: Diagnosis not present

## 2018-09-25 DIAGNOSIS — O0990 Supervision of high risk pregnancy, unspecified, unspecified trimester: Secondary | ICD-10-CM

## 2018-09-25 NOTE — Telephone Encounter (Signed)
Pt called the office requesting 2 hr GTT results. Pt made aware that she passed her 2 hr GTT. Pt states that she started having some vaginal itching and urinary frequency this morning. Pt advised to use Monistat for the itching and to call the office if the Monistat doesn't provide any relief.Understanding was voiced.  Lindsay Phillips l Lindsay Phillips, CMA

## 2018-10-06 ENCOUNTER — Telehealth: Payer: Self-pay

## 2018-10-06 NOTE — Telephone Encounter (Signed)
Pt sent MyChart message stating that she was having upper back pain on 10/05/18. Pt states that she has noticed some contractions. Pt advised to drink water and use extra pillows and to lie on her left side for an hour to monitor contractions. Pt states that she woke up with no pain this morning. Pt also advised to call the office if pain persists.

## 2018-10-17 ENCOUNTER — Encounter: Payer: Self-pay | Admitting: Family Medicine

## 2018-10-17 ENCOUNTER — Ambulatory Visit (INDEPENDENT_AMBULATORY_CARE_PROVIDER_SITE_OTHER): Payer: BC Managed Care – PPO | Admitting: Family Medicine

## 2018-10-17 DIAGNOSIS — Z3400 Encounter for supervision of normal first pregnancy, unspecified trimester: Secondary | ICD-10-CM

## 2018-10-17 DIAGNOSIS — Z3A32 32 weeks gestation of pregnancy: Secondary | ICD-10-CM

## 2018-10-17 DIAGNOSIS — Z3403 Encounter for supervision of normal first pregnancy, third trimester: Secondary | ICD-10-CM

## 2018-10-17 NOTE — Progress Notes (Signed)
TELEHEALTH OBSTETRICS PRENATAL VIRTUAL VIDEO VISIT ENCOUNTER NOTE  Provider location: Center for [facility]   I connected with Lindsay Phillips on 10/17/18 at  8:15 AM EDT by WebEx Video Encounter at home and verified that I am speaking with the correct person using two identifiers.   I discussed the limitations, risks, security and privacy concerns of performing an evaluation and management service by telephone and the availability of in person appointments. I also discussed with the patient that there may be a patient responsible charge related to this service. The patient expressed understanding and agreed to proceed. Subjective:  Lindsay Phillips is a 23 y.o. G2P0010 at [redacted]w[redacted]d being seen today for ongoing prenatal care.  She is currently monitored for the following issues for this low-risk pregnancy and has Supervision of normal first pregnancy, antepartum and Rh negative status during pregnancy on their problem list.  Patient reports occasional contractions.  Contractions: Not present. Vag. Bleeding: None.  Movement: Present. Denies any leaking of fluid.   The following portions of the patient's history were reviewed and updated as appropriate: allergies, current medications, past family history, past medical history, past social history, past surgical history and problem list.   Objective:   Vitals:   10/17/18 0813  BP: 118/66  Pulse: 89    Fetal Status:     Movement: Present     General:  Alert, oriented and cooperative. Patient is in no acute distress.  Respiratory: Normal respiratory effort, no problems with respiration noted  Mental Status: Normal mood and affect. Normal behavior. Normal judgment and thought content.  Rest of physical exam deferred due to type of encounter  Imaging: [place] Mfm Ob Follow Up  Result Date: 09/25/2018 ----------------------------------------------------------------------  OBSTETRICS REPORT                       (Signed  Final 09/25/2018 09:30 am) ---------------------------------------------------------------------- Patient Info  ID #:       [id number]                          D.O.B.:  1996-02-17 (23 yrs)  Name:       Lindsay Phillips                  Visit Date: 09/25/2018 07:54 am ---------------------------------------------------------------------- Performed By  Performed By:     Lenise Arena        Ref. Address:     [address]                    RDMS                                                             RD                                                             [place]  [id number]  Attending:        Noralee Space MD        [facility]  Referred By:      Aviva Signs CNM ---------------------------------------------------------------------- Orders   #  Description                          Code         Ordered By   1  [place] MFM OB FOLLOW UP                  [phone]     Noralee Space  ----------------------------------------------------------------------   #  Order #                    Accession #                 Episode #   1  [id number]                  [id number]                  [id number]  ---------------------------------------------------------------------- Indications   Maternal care for anti-D [Rh} antibodies,      O36.0120   second trimester, unspecified (Too weak to   titer)   [redacted] weeks gestation of pregnancy                Z3A.29   Encounter for other antenatal screening        Z36.2   follow-up  ---------------------------------------------------------------------- Vital Signs  Weight (lb): 176                               Height:        5'5"  BMI:         29.28 ---------------------------------------------------------------------- Fetal Evaluation  Num Of Fetuses:         1  Fetal Heart Rate(bpm):  144  Cardiac Activity:        Observed  Presentation:           Cephalic  Placenta:               Posterior  P. Cord Insertion:      Previously Visualized  Amniotic Fluid  AFI FV:      Within normal limits  AFI Sum(cm)     %Tile       Largest Pocket(cm)  11.68           26          4.83  RUQ(cm)       RLQ(cm)       LUQ(cm)        LLQ(cm)  3.53          0             4.83  3.32 ---------------------------------------------------------------------- Biometry  BPD:      70.8  mm     G. Age:  28w 3d         18  %    CI:        74.23   %    70 - 86                                                          FL/HC:      21.1   %    19.6 - 20.8  HC:      260.9  mm     G. Age:  28w 3d          7  %    HC/AC:      1.08        0.99 - 1.21  AC:      241.4  mm     G. Age:  28w 3d         23  %    FL/BPD:     77.7   %    71 - 87  FL:         55  mm     G. Age:  29w 0d         32  %    FL/AC:      22.8   %    20 - 24  HUM:      48.5  mm     G. Age:  28w 4d         32  %  Est. FW:    1256  gm    2 lb 12 oz      41  % ---------------------------------------------------------------------- OB History  Gravidity:    2  TOP:          1 ---------------------------------------------------------------------- Gestational Age  LMP:           29w 1d        Date:  03/05/18                 EDD:   12/10/18  U/S Today:     28w 4d                                        EDD:   12/14/18  Best:          29w 1d     Det. By:  LMP  (03/05/18)          EDD:   12/10/18 ---------------------------------------------------------------------- Anatomy  Cranium:               Appears normal         Aortic Arch:            Appears normal  Cavum:                 Appears normal         Ductal Arch:            Appears normal  Ventricles:            Appears normal         Diaphragm:  Appears normal  Choroid Plexus:        Previously seen        Stomach:                Appears normal, left                                                                        sided  Cerebellum:             Previously seen        Abdomen:                Appears normal  Posterior Fossa:       Previously seen        Abdominal Wall:         Appears nml (cord                                                                        insert, abd wall)  Nuchal Fold:           Not applicable (>20    Cord Vessels:           Appears normal ([redacted]                         wks GA)                                        vessel cord)  Face:                  Orbits and profile     Kidneys:                Appear normal                         previously seen  Lips:                  Previously seen        Bladder:                Appears normal  Thoracic:              Appears normal         Spine:                  Appears normal  Heart:                 Appears normal         Upper Extremities:      Appears normal                         (4CH, axis, and  situs)  RVOT:                  Appears normal         Lower Extremities:      Appears normal  LVOT:                  Appears normal  Other:  Female gender. Heels visualized. Technically difficult due to fetal          position. ---------------------------------------------------------------------- Cervix Uterus Adnexa  Cervix  Not visualized (advanced GA >24wks)  Uterus  No abnormality visualized. ---------------------------------------------------------------------- Impression  Recent anti-D antibody screen is negative. No evidence of  rhesus isoimmunization.  Fetal growth is appropriate for gestational age. Amniotic fluid  is normal and good fetal activity is seen. ---------------------------------------------------------------------- Recommendations  Follow-up as clinically indicated. ----------------------------------------------------------------------                  Noralee Space, MD Electronically Signed Final Report   09/25/2018 09:30 am ----------------------------------------------------------------------   Assessment and Plan:  Pregnancy: G2P0010 at  [redacted]w[redacted]d 1. Supervision of normal first pregnancy, antepartum Discussed si/sxs normal labor, POP contraception  Preterm labor symptoms and general obstetric precautions including but not limited to vaginal bleeding, contractions, leaking of fluid and fetal movement were reviewed in detail with the patient. I discussed the assessment and treatment plan with the patient. The patient was provided an opportunity to ask questions and all were answered. The patient agreed with the plan and demonstrated an understanding of the instructions. The patient was advised to call back or seek an in-person office evaluation/go to MAU at [facility] for any urgent or concerning symptoms. Please refer to After Visit Summary for other counseling recommendations.   I provided 25 minutes of face-to-face time during this encounter.  Return in about 4 weeks (around 11/14/2018) for OB f/u, In Office.  No future appointments.  Levie Heritage, DO Center for [employer], [facility]

## 2018-10-17 NOTE — Progress Notes (Signed)
Patient identified by name and dob. Patient agrees to webex visit. Armandina Stammer RN

## 2018-11-03 ENCOUNTER — Telehealth: Payer: Self-pay

## 2018-11-03 ENCOUNTER — Inpatient Hospital Stay (HOSPITAL_COMMUNITY)
Admission: AD | Admit: 2018-11-03 | Discharge: 2018-11-03 | Disposition: A | Discharge status: Home / Self Care | Payer: Medicaid Other | Attending: Obstetrics & Gynecology | Admitting: Obstetrics & Gynecology

## 2018-11-03 ENCOUNTER — Encounter (HOSPITAL_COMMUNITY): Payer: Self-pay

## 2018-11-03 ENCOUNTER — Other Ambulatory Visit: Payer: Self-pay

## 2018-11-03 DIAGNOSIS — Z3689 Encounter for other specified antenatal screening: Secondary | ICD-10-CM

## 2018-11-03 DIAGNOSIS — W268XXA Contact with other sharp object(s), not elsewhere classified, initial encounter: Secondary | ICD-10-CM

## 2018-11-03 DIAGNOSIS — W228XXA Striking against or struck by other objects, initial encounter: Secondary | ICD-10-CM | POA: Diagnosis not present

## 2018-11-03 DIAGNOSIS — O9A213 Injury, poisoning and certain other consequences of external causes complicating pregnancy, third trimester: Secondary | ICD-10-CM | POA: Diagnosis not present

## 2018-11-03 DIAGNOSIS — Z3A34 34 weeks gestation of pregnancy: Secondary | ICD-10-CM | POA: Diagnosis not present

## 2018-11-03 DIAGNOSIS — R109 Unspecified abdominal pain: Secondary | ICD-10-CM | POA: Insufficient documentation

## 2018-11-03 DIAGNOSIS — O26893 Other specified pregnancy related conditions, third trimester: Secondary | ICD-10-CM | POA: Insufficient documentation

## 2018-11-03 LAB — URINALYSIS, ROUTINE W REFLEX MICROSCOPIC
Bilirubin Urine: NEGATIVE
Glucose, UA: 150 mg/dL — AB
Hgb urine dipstick: NEGATIVE
Ketones, ur: NEGATIVE mg/dL
Nitrite: NEGATIVE
Protein, ur: NEGATIVE mg/dL
Specific Gravity, Urine: 1.003 — ABNORMAL LOW (ref 1.005–1.030)
pH: 7 (ref 5.0–8.0)

## 2018-11-03 NOTE — MAU Note (Signed)
.   Lindsay Phillips is a 23 y.o. at [redacted]w[redacted]d here in MAU reporting:that she ran into her baby's changing table at home hitting herself in her abdomen. Denies any vaginal bleeding or LOF  Onset of complaint: today Pain score: 3 Vitals:   11/03/18 1637 11/03/18 1639  BP: 138/87 118/71  Pulse: (!) 106   Resp: 16   Temp: 98.1 F (36.7 C)   SpO2: 100%      FHT 132 Lab orders placed from triage: UA

## 2018-11-03 NOTE — Discharge Instructions (Signed)
Return to MAU:  If you have heavy bleeding that soaks through more that 2 pads per hour for an hour or more  If you bleed so much that you feel like you might pass out or you do pass out  If you have significant abdominal pain that is not improved with Tylenol   If you develop a fever > 100.5  If you have no fetal movement

## 2018-11-03 NOTE — Telephone Encounter (Signed)
Patient called stating that she was walking today and hit her belly pretty hard. Patient states that she does have some soreness now. Patient does states she has felt her baby move since then and denies any bleeding. Made patient aware that she can go to Maternity Admissions unit at [facility] [place] (ent[address]). Kathrene Alu RN

## 2018-11-03 NOTE — MAU Provider Note (Addendum)
History     CSN: [id number]  Arrival date and time: 11/03/18 1603   First Provider Initiated Contact with Patient 11/03/18 1717      Chief Complaint  Patient presents with  . hit in abdomen   Lindsay Phillips is a 23 y.o. G2P0010 at 3157w5d who presents today after she hit her abdomen. She states that around 1430 she hit the sharp corner on a table. She has been having some pain in her abdomen since then. She denies any VB or LOF. She reports normal fetal movement. She denies any contractions.   Abdominal Pain  This is a new problem. The current episode started today. The onset quality is sudden. The problem occurs constantly. The problem has been unchanged. The pain is located in the periumbilical region (extending out to the right middle of the abdomen. ). The pain is at a severity of 1/10. The abdominal pain does not radiate. Nothing aggravates the pain. The pain is relieved by nothing. She has tried acetaminophen for the symptoms. The treatment provided significant relief.    OB History    Gravida  2   Para      Term      Preterm      AB  1   Living        SAB      TAB  1   Ectopic      Multiple      Live Births              Past Medical History:  Diagnosis Date  . Medical history non-contributory     Past Surgical History:  Procedure Laterality Date  . INDUCED ABORTION      Family History  Problem Relation Age of Onset  . Healthy Mother   . Healthy Father   . Hypertension Maternal Grandmother     Social History   Tobacco Use  . Smoking status: Never Smoker  . Smokeless tobacco: Never Used  Substance Use Topics  . Alcohol use: Not Currently    Frequency: Never    Comment: not in pregnancy  . Drug use: Never    Allergies: No Known Allergies  Medications Prior to Admission  Medication Sig Dispense Refill Last Dose  . Doxylamine-Pyridoxine (DICLEGIS) 10-10 MG TBEC Take 2 tablets by mouth at bedtime as needed. (Patient not taking: Reported  on 07/25/2018) 100 tablet 5 Not Taking  . Omega-3 Fatty Acids (FISH OIL PO) Take by mouth.   Taking  . Prenatal Vit-Fe Fumarate-FA (PRENATAL MULTIVITAMIN) TABS tablet Take 1 tablet by mouth daily at 12 noon.   Taking    Review of Systems  Gastrointestinal: Positive for abdominal pain.   Physical Exam   Blood pressure 118/71, pulse (!) 106, temperature 98.1 F (36.7 C), resp. rate 16, last menstrual period 03/05/2018, SpO2 100 %.  Physical Exam  Nursing note and vitals reviewed. Constitutional: She is oriented to person, place, and time. She appears well-developed and well-nourished. No distress.  HENT:  Head: Normocephalic.  Cardiovascular: Normal rate.  Respiratory: Effort normal.  GI: Soft. There is no abdominal tenderness. There is no rebound.  No bruising or abrasion to abdomen where she hit herself.   Neurological: She is alert and oriented to person, place, and time.  Skin: Skin is warm and dry.  Psychiatric: She has a normal mood and affect.   NST:  Baseline: 130 Variability: moderate Accels: 15x15 Decels: none Toco: none  Results for orders placed or performed during the  hospital encounter of 11/03/18 (from the past 24 hour(s))  Urinalysis, Routine w reflex microscopic     Status: Abnormal   Collection Time: 11/03/18  4:42 PM  Result Value Ref Range   Color, Urine STRAW (A) YELLOW   APPearance CLEAR CLEAR   Specific Gravity, Urine 1.003 (L) 1.005 - 1.030   pH 7.0 5.0 - 8.0   Glucose, UA 150 (A) NEGATIVE mg/dL   Hgb urine dipstick NEGATIVE NEGATIVE   Bilirubin Urine NEGATIVE NEGATIVE   Ketones, ur NEGATIVE NEGATIVE mg/dL   Protein, ur NEGATIVE NEGATIVE mg/dL   Nitrite NEGATIVE NEGATIVE   Leukocytes,Ua SMALL (A) NEGATIVE   RBC / HPF 0-5 0 - 5 RBC/hpf   WBC, UA 0-5 0 - 5 WBC/hpf   Bacteria, UA FEW (A) NONE SEEN   Squamous Epithelial / LPF 0-5 0 - 5    MAU Course  Procedures  MDM 5:30pm care turned over to Laury Deep, CNM Plan to monitor until 6:30pm  (4 hours post- hit) and DC if tracing remains reactive.   Assessment and Plan  Traumatic injury during pregnancy in third trimester  - Reviewed when to return to MAU for bleeding and/or pain, no fetal movement, leaking of fluid; any of those sx's can be a sign of emergency - Information provided on preventing injuries in pregnancy   NST (non-stress test) reactive on fetal surveillance  - Information provided on [facility]   - Discharge patient - Keep scheduled appt on 11/14/18  - Patient verbalized an understanding of the plan of care and agrees.      Laury Deep, CNM  11/03/2018 6:13 PM

## 2018-11-11 ENCOUNTER — Telehealth: Payer: Self-pay

## 2018-11-11 NOTE — Telephone Encounter (Signed)
Pt called the office c/o watery discharge with an odor x 3 days. Pt is scheduled to come in to be seen on 11/12/18.  chiquita l wilson, CMA

## 2018-11-12 ENCOUNTER — Ambulatory Visit (INDEPENDENT_AMBULATORY_CARE_PROVIDER_SITE_OTHER): Payer: Medicaid Other | Admitting: Obstetrics & Gynecology

## 2018-11-12 ENCOUNTER — Other Ambulatory Visit (HOSPITAL_COMMUNITY)
Admission: RE | Admit: 2018-11-12 | Discharge: 2018-11-12 | Disposition: A | Discharge status: Home / Self Care | Payer: BC Managed Care – PPO | Source: Ambulatory Visit | Attending: Family Medicine | Admitting: Family Medicine

## 2018-11-12 VITALS — BP 119/69 | HR 92 | Wt 181.0 lb

## 2018-11-12 DIAGNOSIS — O360930 Maternal care for other rhesus isoimmunization, third trimester, not applicable or unspecified: Secondary | ICD-10-CM

## 2018-11-12 DIAGNOSIS — O9A213 Injury, poisoning and certain other consequences of external causes complicating pregnancy, third trimester: Secondary | ICD-10-CM

## 2018-11-12 DIAGNOSIS — O26893 Other specified pregnancy related conditions, third trimester: Secondary | ICD-10-CM

## 2018-11-12 DIAGNOSIS — Z3400 Encounter for supervision of normal first pregnancy, unspecified trimester: Secondary | ICD-10-CM

## 2018-11-12 DIAGNOSIS — Z3A36 36 weeks gestation of pregnancy: Secondary | ICD-10-CM

## 2018-11-12 DIAGNOSIS — Z6791 Unspecified blood type, Rh negative: Secondary | ICD-10-CM

## 2018-11-12 NOTE — Patient Instructions (Signed)
Natural Childbirth Natural childbirth is when labor and delivery progresses naturally with minimal medical assistance or medicines. Natural childbirth may be an option if you have a low risk pregnancy. With the help of a birthing professional such as a midwife, doula, or other health care provider, you may be able to use relaxation techniques and controlled breathing to manage pain during labor. Many women choose natural childbirth because it makes them feel more in control and in touch with the experience of giving birth. Some women also choose natural childbirth because they are concerned that medicines may affect them and their babies. What types of natural childbirth techniques are available? There are two types of natural childbirth techniques, which are taught in classes:  The Lamaze method. In these classes, parents learn that having a baby is normal, healthy, and natural. Mothers are taught to take a neutral position regarding pain medicine and numbing medicines, and to make an informed decision about using these medicines if the time comes.  The Bradley method, also called husband-coached birth. In these classes, the father or partner learns to be the birth coach. The mother is encouraged to exercise and eat a balanced, nutritious diet. Both parents also learn relaxation and deep breathing exercises and are taught how to prepare for emergency situations. What are some natural pain and relaxation techniques? If you are considering a natural childbirth, you should explore your options for managing pain and discomfort during your labor and delivery. Some natural pain and relaxation techniques include:  Meditation.  Yoga.  Hypnosis.  Acupuncture.  Massage.  Changing positions, such as by walking, rocking, showering, or leaning on birth balls.  Lying in warm water or a whirlpool bath.  Finding an activity that keeps your mind off the labor pain.  Listening to soft music.  Focusing  on a particular object (visual imagery). How can I prepare for a natural birth?  Talk with your spouse or partner about your goals for having a natural childbirth.  Decide if you will have your baby in the hospital, at a birthing center, or at home.  Have your spouse or partner attend the natural childbirth technique classes with you.  Decide which type of health care provider you would like to assist you with your delivery.  If you have other children, make plans to have someone take care of them when you go to the hospital or birthing center.  Know the distance and the time it takes to go to the hospital or birthing center. Practice going there and time it to be sure.  Have a bag packed with a nightgown, bathrobe, and toiletries. Be ready to take it with you when you go into labor.  Keep phone numbers of your family and friends handy if you need to call someone when you go into labor.  Talk with your health care provider about the possibility of a medical emergency and what will happen if that occurs. What are the advantages and disadvantages of natural childbirth? Advantages  You are in control of your labor and delivery experience.  You may be able to avoid some common medical practices, such as getting medicines or being monitored all the time.  You and your spouse or partner will work together, which can increase your bond with each other.  In most delivery centers, your family and friends can be involved in the labor and delivery process. Disadvantages  The methods of helping relieve labor pains may not work for you.  You may feel   disappointed if you change your mind during labor and decide not to have a natural childbirth. What can I expect after delivery?  You may feel very tired.  You may feel uncomfortable as your uterus contracts.  The area around your vagina will feel sore.  You may feel cold and shaky. What questions should I ask my health care provider?   Am I a good candidate for natural childbirth?  Can you refer me to classes to learn more about natural childbirth?  How do I create a birth plan that outlines my wishes for natural childbirth? Where to find more information  American Pregnancy Association: americanpregnancy.org  [employer] of Obstetricians and Gynecologists: acog.[place]: www.midwife.org Summary  Natural childbirth may be an option if you have a low risk pregnancy.  The Leory Plowman or Lamaze Methods are techniques that can assist you in achieving a natural birth experience.  Talk to your health care provider to determine if you are a good candidate for a natural childbirth. This information is not intended to replace advice given to you by your health care provider. Make sure you discuss any questions you have with your health care provider. Document Released: 04/26/2008 Document Revised: 07/23/2016 Document Reviewed: 07/23/2016 Elsevier Interactive Patient Education  2019 [employer].

## 2018-11-12 NOTE — Progress Notes (Signed)
Patient complaining about leaking fluid.

## 2018-11-12 NOTE — Progress Notes (Signed)
   PRENATAL VISIT NOTE  Subjective:  Lindsay Phillips is a 23 y.o. G2P0010 at [redacted]w[redacted]d being seen today for ongoing prenatal care.  She is currently monitored for the following issues for this low-risk pregnancy and has Supervision of normal first pregnancy, antepartum; Rh negative status during pregnancy; Traumatic injury during pregnancy in third trimester; and NST (non-stress test) reactive on fetal surveillance on their problem list.  Patient reports no complaints.  Contractions: Not present. Vag. Bleeding: None.  Movement: Present. Denies leaking of fluid.   The following portions of the patient's history were reviewed and updated as appropriate: allergies, current medications, past family history, past medical history, past social history, past surgical history and problem list.   Objective:   Vitals:   11/12/18 1545  BP: 119/69  Pulse: 92  Weight: 181 lb (82.1 kg)    Fetal Status:     Movement: Present     General:  Alert, oriented and cooperative. Patient is in no acute distress.  Skin: Skin is warm and dry. No rash noted.   Cardiovascular: Normal heart rate noted  Respiratory: Normal respiratory effort, no problems with respiration noted  Abdomen: Soft, gravid, appropriate for gestational age.  Pain/Pressure: Absent     Pelvic: Cervical exam performed        Extremities: Normal range of motion.  Edema: Trace  Mental Status: Normal mood and affect. Normal behavior. Normal judgment and thought content.   Assessment and Plan:  Pregnancy: G2P0010 at [redacted]w[redacted]d 1. Supervision of normal first pregnancy, antepartum  - Culture, beta strep (group b only) - GC/Chlamydia probe amp ([place])not at [facility]  2. Rh negative, antepartum S/p Rhogam   3. Traumatic injury during pregnancy in third trimester  Preterm labor symptoms and general obstetric precautions including but not limited to vaginal bleeding, contractions, leaking of fluid and fetal movement were reviewed in detail with the  patient. Please refer to After Visit Summary for other counseling recommendations.   No follow-ups on file.  No future appointments.  Lavonia Drafts, MD

## 2018-11-14 ENCOUNTER — Encounter: Payer: Medicaid Other | Admitting: Family Medicine

## 2018-11-14 LAB — GC/CHLAMYDIA PROBE AMP (~~LOC~~) NOT AT ARMC
Chlamydia: NEGATIVE
Neisseria Gonorrhea: NEGATIVE

## 2018-11-17 LAB — CULTURE, BETA STREP (GROUP B ONLY): Strep Gp B Culture: NEGATIVE

## 2018-11-25 ENCOUNTER — Ambulatory Visit (INDEPENDENT_AMBULATORY_CARE_PROVIDER_SITE_OTHER): Payer: BC Managed Care – PPO | Admitting: Advanced Practice Midwife

## 2018-11-25 ENCOUNTER — Encounter: Payer: Self-pay | Admitting: Advanced Practice Midwife

## 2018-11-25 ENCOUNTER — Other Ambulatory Visit: Payer: Self-pay

## 2018-11-25 DIAGNOSIS — Z3A37 37 weeks gestation of pregnancy: Secondary | ICD-10-CM | POA: Diagnosis not present

## 2018-11-25 DIAGNOSIS — O368130 Decreased fetal movements, third trimester, not applicable or unspecified: Secondary | ICD-10-CM | POA: Diagnosis not present

## 2018-11-25 DIAGNOSIS — O368190 Decreased fetal movements, unspecified trimester, not applicable or unspecified: Secondary | ICD-10-CM | POA: Insufficient documentation

## 2018-11-25 DIAGNOSIS — Z3400 Encounter for supervision of normal first pregnancy, unspecified trimester: Secondary | ICD-10-CM

## 2018-11-25 NOTE — Progress Notes (Deleted)
  Subjective:    Lindsay Phillips is being seen today for her first obstetrical visit.  This {is/is not:9024} a planned pregnancy. She is at [redacted]w[redacted]d gestation. Her obstetrical history is significant for {ob risk factors:10154}. Relationship with FOB: {fob:16621}. Patient {does/does not:19097} intend to breast feed. Pregnancy history fully reviewed.  Patient reports {sx:14538}.  Review of Systems:   Review of Systems  Objective:     BP 112/70   Pulse (!) 108   Wt 88 kg   LMP 03/05/2018 (Exact Date)   BMI 32.30 kg/m  Physical Exam  Exam    Assessment:    Pregnancy: G2P0010 Patient Active Problem List   Diagnosis Date Noted  . Traumatic injury during pregnancy in third trimester 11/03/2018  . NST (non-stress test) reactive on fetal surveillance 11/03/2018  . Supervision of normal first pregnancy, antepartum 05/13/2018  . Rh negative status during pregnancy 05/13/2018       Plan:     Initial labs drawn. Prenatal vitamins. Problem list reviewed and updated. AFP3 discussed: {requests/ordered/declines:14581}. Role of ultrasound in pregnancy discussed; fetal survey: {requests/ordered/declines:14581}. Amniocentesis discussed: {amniocentesis:14582}. Follow up in {numbers 0-4:31231} weeks. ***% of *** min visit spent on counseling and coordination of care.  ***   Hansel Feinstein 11/25/2018

## 2018-11-25 NOTE — Progress Notes (Signed)
   PRENATAL VISIT NOTE  Subjective:  Lindsay Phillips is a 23 y.o. G2P0010 at [redacted]w[redacted]d being seen today for ongoing prenatal care.  She is currently monitored for the following issues for this low-risk pregnancy and has Supervision of normal first pregnancy, antepartum; Rh negative status during pregnancy; Traumatic injury during pregnancy in third trimester; and NST (non-stress test) reactive on fetal surveillance on their problem list.  Patient reports decreased fetal movement.  Contractions: Irregular. Vag. Bleeding: None.  Movement: (!) Decreased. Denies leaking of fluid.   The following portions of the patient's history were reviewed and updated as appropriate: allergies, current medications, past family history, past medical history, past social history, past surgical history and problem list.   Objective:   Vitals:   11/25/18 0846  BP: 112/70  Pulse: (!) 108  Weight: 88 kg    Fetal Status: Fetal Heart Rate (bpm): 156 Fundal Height: 36 cm Movement: (!) Decreased     General:  Alert, oriented and cooperative. Patient is in no acute distress.  Skin: Skin is warm and dry. No rash noted.   Cardiovascular: Normal heart rate noted  Respiratory: Normal respiratory effort, no problems with respiration noted  Abdomen: Soft, gravid, appropriate for gestational age.  Pain/Pressure: Present     Pelvic: Cervical exam deferred        Extremities: Normal range of motion.  Edema: Trace  Mental Status: Normal mood and affect. Normal behavior. Normal judgment and thought content.   Assessment and Plan:  Pregnancy: G2P0010 at [redacted]w[redacted]d 1. Supervision of normal first pregnancy, antepartum      Reviewed symptoms to report reviewed signs of labor 2.   Decreased Fetal movement      NST in office was reactive, discussed FM  Term labor symptoms and general obstetric precautions including but not limited to vaginal bleeding, contractions, leaking of fluid and fetal movement were reviewed in detail with the  patient. Please refer to After Visit Summary for other counseling recommendations.   RTO 1 week  Hansel Feinstein, CNM

## 2018-11-25 NOTE — Patient Instructions (Addendum)

## 2018-12-02 ENCOUNTER — Ambulatory Visit (INDEPENDENT_AMBULATORY_CARE_PROVIDER_SITE_OTHER): Payer: Medicaid Other | Admitting: Advanced Practice Midwife

## 2018-12-02 ENCOUNTER — Encounter (HOSPITAL_COMMUNITY): Payer: Self-pay | Admitting: *Deleted

## 2018-12-02 ENCOUNTER — Other Ambulatory Visit: Payer: Self-pay

## 2018-12-02 ENCOUNTER — Encounter: Payer: Self-pay | Admitting: Advanced Practice Midwife

## 2018-12-02 ENCOUNTER — Inpatient Hospital Stay (HOSPITAL_COMMUNITY)
Admission: AD | Admit: 2018-12-02 | Discharge: 2018-12-05 | DRG: 806 | Disposition: A | Discharge status: Home / Self Care | Payer: BC Managed Care – PPO | Attending: Family Medicine | Admitting: Family Medicine

## 2018-12-02 VITALS — BP 118/75 | HR 96 | Wt 194.0 lb

## 2018-12-02 DIAGNOSIS — Z6791 Unspecified blood type, Rh negative: Secondary | ICD-10-CM

## 2018-12-02 DIAGNOSIS — Z3A39 39 weeks gestation of pregnancy: Secondary | ICD-10-CM

## 2018-12-02 DIAGNOSIS — Z1159 Encounter for screening for other viral diseases: Secondary | ICD-10-CM

## 2018-12-02 DIAGNOSIS — Z3A38 38 weeks gestation of pregnancy: Secondary | ICD-10-CM

## 2018-12-02 DIAGNOSIS — Z3400 Encounter for supervision of normal first pregnancy, unspecified trimester: Secondary | ICD-10-CM

## 2018-12-02 DIAGNOSIS — O26893 Other specified pregnancy related conditions, third trimester: Secondary | ICD-10-CM

## 2018-12-02 DIAGNOSIS — O26899 Other specified pregnancy related conditions, unspecified trimester: Secondary | ICD-10-CM

## 2018-12-02 DIAGNOSIS — O360930 Maternal care for other rhesus isoimmunization, third trimester, not applicable or unspecified: Secondary | ICD-10-CM

## 2018-12-02 DIAGNOSIS — O4292 Full-term premature rupture of membranes, unspecified as to length of time between rupture and onset of labor: Principal | ICD-10-CM | POA: Diagnosis present

## 2018-12-02 DIAGNOSIS — O368130 Decreased fetal movements, third trimester, not applicable or unspecified: Secondary | ICD-10-CM

## 2018-12-02 DIAGNOSIS — Z3689 Encounter for other specified antenatal screening: Secondary | ICD-10-CM

## 2018-12-02 NOTE — Progress Notes (Signed)
   PRENATAL VISIT NOTE  Subjective:  Lindsay Phillips is a 23 y.o. G2P0010 at [redacted]w[redacted]d being seen today for ongoing prenatal care.  She is currently monitored for the following issues for this low-risk pregnancy and has Supervision of normal first pregnancy, antepartum; Rh negative status during pregnancy; Traumatic injury during pregnancy in third trimester; NST (non-stress test) reactive on fetal surveillance; and Decreased fetal movement on their problem list.  Patient reports occasional contractions.  Contractions: Irregular. Vag. Bleeding: None.  Movement: Present. Denies leaking of fluid.   The following portions of the patient's history were reviewed and updated as appropriate: allergies, current medications, past family history, past medical history, past social history, past surgical history and problem list.   Objective:   Vitals:   12/02/18 0920  BP: 118/75  Pulse: 96  Weight: 88 kg    Fetal Status: Fetal Heart Rate (bpm): 138 Fundal Height: 37 cm Movement: Present  Presentation: Vertex  General:  Alert, oriented and cooperative. Patient is in no acute distress.  Skin: Skin is warm and dry. No rash noted.   Cardiovascular: Normal heart rate noted  Respiratory: Normal respiratory effort, no problems with respiration noted  Abdomen: Soft, gravid, appropriate for gestational age.  Pain/Pressure: Present     Pelvic: Cervical exam performed Dilation: 2 Effacement (%): 60 Station: -3  Extremities: Normal range of motion.  Edema: Trace  Mental Status: Normal mood and affect. Normal behavior. Normal judgment and thought content.   Assessment and Plan:  Pregnancy: G2P0010 at [redacted]w[redacted]d 1. Rh negative, antepartum     Had Rhophylac 2.  Pregnancy      Reviewed signs of labor     GBS neg  Term labor symptoms and general obstetric precautions including but not limited to vaginal bleeding, contractions, leaking of fluid and fetal movement were reviewed in detail with the patient. Please  refer to After Visit Summary for other counseling recommendations.   Return in about 1 week (around 12/09/2018) for [facility].  Future Appointments  Date Time Provider [place]  12/08/2018  8:45 AM Lavonia Drafts, MD [facility] None  01/22/2019 10:30 AM Truett Mainland, DO [facility] None    Hansel Feinstein, CNM

## 2018-12-02 NOTE — Progress Notes (Deleted)
  Subjective:    Lindsay Phillips is being seen today for her first obstetrical visit.  This {is/is not:9024} a planned pregnancy. She is at [redacted]w[redacted]d gestation. Her obstetrical history is significant for {ob risk factors:10154}. Relationship with FOB: {fob:16621}. Patient {does/does not:19097} intend to breast feed. Pregnancy history fully reviewed.  Patient reports {sx:14538}.  Review of Systems:   Review of Systems  Objective:     BP 118/75   Pulse 96   Wt 88 kg   LMP 03/05/2018 (Exact Date)   BMI 32.28 kg/m  Physical Exam  Exam    Assessment:    Pregnancy: G2P0010 Patient Active Problem List   Diagnosis Date Noted  . Decreased fetal movement 11/25/2018  . Traumatic injury during pregnancy in third trimester 11/03/2018  . NST (non-stress test) reactive on fetal surveillance 11/03/2018  . Supervision of normal first pregnancy, antepartum 05/13/2018  . Rh negative status during pregnancy 05/13/2018       Plan:     Initial labs drawn. Prenatal vitamins. Problem list reviewed and updated. AFP3 discussed: {requests/ordered/declines:14581}. Role of ultrasound in pregnancy discussed; fetal survey: {requests/ordered/declines:14581}. Amniocentesis discussed: {amniocentesis:14582}. Follow up in {numbers 0-4:31231} weeks. ***% of *** min visit spent on counseling and coordination of care.  ***   Hansel Feinstein 12/02/2018

## 2018-12-02 NOTE — Progress Notes (Deleted)
  Subjective:    Lindsay Phillips is being seen today for her first obstetrical visit.  This {is/is not:9024} a planned pregnancy. She is at [redacted]w[redacted]d gestation. Her obstetrical history is significant for {ob risk factors:10154}. Relationship with FOB: {fob:16621}. Patient {does/does not:19097} intend to breast feed. Pregnancy history fully reviewed.  Patient reports {sx:14538}.  Review of Systems:   Review of Systems  Objective:     BP 118/75   Pulse 96   Wt 88 kg   LMP 03/05/2018 (Exact Date)   BMI 32.28 kg/m  Physical Exam  Exam    Assessment:    Pregnancy: G2P0010 Patient Active Problem List   Diagnosis Date Noted  . Decreased fetal movement 11/25/2018  . Traumatic injury during pregnancy in third trimester 11/03/2018  . NST (non-stress test) reactive on fetal surveillance 11/03/2018  . Supervision of normal first pregnancy, antepartum 05/13/2018  . Rh negative status during pregnancy 05/13/2018       Plan:     Initial labs drawn. Prenatal vitamins. Problem list reviewed and updated. AFP3 discussed: {requests/ordered/declines:14581}. Role of ultrasound in pregnancy discussed; fetal survey: {requests/ordered/declines:14581}. Amniocentesis discussed: {amniocentesis:14582}. Follow up in {numbers 0-4:31231} weeks. ***% of *** min visit spent on counseling and coordination of care.  ***   Lindsay Phillips 12/02/2018   

## 2018-12-02 NOTE — Progress Notes (Deleted)
  Subjective:    Lindsay Phillips is being seen today for her first obstetrical visit.  This {is/is not:9024} a planned pregnancy. She is at [redacted]w[redacted]d gestation. Her obstetrical history is significant for {ob risk factors:10154}. Relationship with FOB: {fob:16621}. Patient {does/does not:19097} intend to breast feed. Pregnancy history fully reviewed.  Patient reports {sx:14538}.  Review of Systems:   Review of Systems  Objective:     BP 118/75   Pulse 96   Wt 88 kg   LMP 03/05/2018 (Exact Date)   BMI 32.28 kg/m  Physical Exam  Exam    Assessment:    Pregnancy: G2P0010 Patient Active Problem List   Diagnosis Date Noted  . Decreased fetal movement 11/25/2018  . Traumatic injury during pregnancy in third trimester 11/03/2018  . NST (non-stress test) reactive on fetal surveillance 11/03/2018  . Supervision of normal first pregnancy, antepartum 05/13/2018  . Rh negative status during pregnancy 05/13/2018       Plan:     Initial labs drawn. Prenatal vitamins. Problem list reviewed and updated. AFP3 discussed: {requests/ordered/declines:14581}. Role of ultrasound in pregnancy discussed; fetal survey: {requests/ordered/declines:14581}. Amniocentesis discussed: {amniocentesis:14582}. Follow up in {numbers 0-4:31231} weeks. ***% of *** min visit spent on counseling and coordination of care.  ***   [name] 12/02/2018   

## 2018-12-02 NOTE — MAU Note (Signed)
PT SAYS SROM AT 10PM-  WAS SITTING - THEN STOOD - STARTED LEAKING- CLEAR  WITH PINK- HAS STOPPED. [place] [facility] [place]-  [facility] -  2 CM - THIS AM .  DENIES HSV AND MRSA.  GBS- NEG

## 2018-12-02 NOTE — Patient Instructions (Signed)

## 2018-12-03 ENCOUNTER — Encounter (HOSPITAL_COMMUNITY): Payer: Self-pay

## 2018-12-03 DIAGNOSIS — Z6791 Unspecified blood type, Rh negative: Secondary | ICD-10-CM | POA: Diagnosis not present

## 2018-12-03 DIAGNOSIS — O4292 Full-term premature rupture of membranes, unspecified as to length of time between rupture and onset of labor: Secondary | ICD-10-CM | POA: Diagnosis present

## 2018-12-03 DIAGNOSIS — O26893 Other specified pregnancy related conditions, third trimester: Secondary | ICD-10-CM | POA: Diagnosis present

## 2018-12-03 DIAGNOSIS — O4202 Full-term premature rupture of membranes, onset of labor within 24 hours of rupture: Secondary | ICD-10-CM | POA: Diagnosis not present

## 2018-12-03 DIAGNOSIS — Z3A39 39 weeks gestation of pregnancy: Secondary | ICD-10-CM | POA: Diagnosis not present

## 2018-12-03 DIAGNOSIS — Z1159 Encounter for screening for other viral diseases: Secondary | ICD-10-CM | POA: Diagnosis not present

## 2018-12-03 LAB — RPR: RPR Ser Ql: NONREACTIVE

## 2018-12-03 LAB — CBC
HCT: 37.8 % (ref 36.0–46.0)
Hemoglobin: 12.2 g/dL (ref 12.0–15.0)
MCH: 28 pg (ref 26.0–34.0)
MCHC: 32.3 g/dL (ref 30.0–36.0)
MCV: 86.9 fL (ref 80.0–100.0)
Platelets: 158 10*3/uL (ref 150–400)
RBC: 4.35 MIL/uL (ref 3.87–5.11)
RDW: 14.9 % (ref 11.5–15.5)
WBC: 10.7 10*3/uL — ABNORMAL HIGH (ref 4.0–10.5)
nRBC: 0 % (ref 0.0–0.2)

## 2018-12-03 LAB — TYPE AND SCREEN
ABO/RH(D): O NEG
Antibody Screen: NEGATIVE

## 2018-12-03 LAB — SARS CORONAVIRUS 2 BY RT PCR (HOSPITAL ORDER, PERFORMED IN ~~LOC~~ HOSPITAL LAB): SARS Coronavirus 2: NEGATIVE

## 2018-12-03 MED ORDER — FLEET ENEMA 7-19 GM/118ML RE ENEM: 1.0000 | ENEMA | RECTAL | Status: DC | PRN

## 2018-12-03 MED ORDER — COCONUT OIL OIL
1.0000 "application " | TOPICAL_OIL | Status: DC | PRN
  Administered 2018-12-05: 1 via TOPICAL

## 2018-12-03 MED ORDER — OXYTOCIN 40 UNITS IN NORMAL SALINE INFUSION - SIMPLE MED
2.5000 [IU]/h | INTRAVENOUS | Status: DC
  Administered 2018-12-03: 2.5 [IU]/h via INTRAVENOUS
  Filled 2018-12-03: qty 1000

## 2018-12-03 MED ORDER — TETANUS-DIPHTH-ACELL PERTUSSIS 5-2.5-18.5 LF-MCG/0.5 IM SUSP: 0.5000 mL | Freq: Once | INTRAMUSCULAR | Status: DC

## 2018-12-03 MED ORDER — MISOPROSTOL 200 MCG PO TABS
ORAL_TABLET | ORAL | Status: AC
  Filled 2018-12-03: qty 4

## 2018-12-03 MED ORDER — ACETAMINOPHEN 325 MG PO TABS: 650.0000 mg | ORAL_TABLET | ORAL | Status: DC | PRN

## 2018-12-03 MED ORDER — OXYCODONE-ACETAMINOPHEN 5-325 MG PO TABS: 2.0000 | ORAL_TABLET | ORAL | Status: DC | PRN

## 2018-12-03 MED ORDER — OXYCODONE-ACETAMINOPHEN 5-325 MG PO TABS: 1.0000 | ORAL_TABLET | ORAL | Status: DC | PRN

## 2018-12-03 MED ORDER — LACTATED RINGERS IV SOLN
INTRAVENOUS | Status: DC
  Administered 2018-12-03: 01:00:00 via INTRAVENOUS

## 2018-12-03 MED ORDER — ZOLPIDEM TARTRATE 5 MG PO TABS: 5.0000 mg | ORAL_TABLET | Freq: Every evening | ORAL | Status: DC | PRN

## 2018-12-03 MED ORDER — SOD CITRATE-CITRIC ACID 500-334 MG/5ML PO SOLN: 30.0000 mL | ORAL | Status: DC | PRN

## 2018-12-03 MED ORDER — LIDOCAINE HCL (PF) 1 % IJ SOLN: 30.0000 mL | INTRAMUSCULAR | Status: DC | PRN

## 2018-12-03 MED ORDER — OXYTOCIN 40 UNITS IN NORMAL SALINE INFUSION - SIMPLE MED: 2.5000 [IU]/h | INTRAVENOUS | Status: DC

## 2018-12-03 MED ORDER — MISOPROSTOL 200 MCG PO TABS
800.0000 ug | ORAL_TABLET | Freq: Once | ORAL | Status: AC
  Administered 2018-12-03: 800 ug via ORAL

## 2018-12-03 MED ORDER — PRENATAL MULTIVITAMIN CH
1.0000 | ORAL_TABLET | Freq: Every day | ORAL | Status: DC
  Administered 2018-12-03 – 2018-12-05 (×3): 1 via ORAL
  Filled 2018-12-03 (×3): qty 1

## 2018-12-03 MED ORDER — DIPHENHYDRAMINE HCL 25 MG PO CAPS: 25.0000 mg | ORAL_CAPSULE | Freq: Four times a day (QID) | ORAL | Status: DC | PRN

## 2018-12-03 MED ORDER — ONDANSETRON HCL 4 MG PO TABS: 4.0000 mg | ORAL_TABLET | ORAL | Status: DC | PRN

## 2018-12-03 MED ORDER — ONDANSETRON HCL 4 MG/2ML IJ SOLN
4.0000 mg | Freq: Four times a day (QID) | INTRAMUSCULAR | Status: DC | PRN
  Administered 2018-12-03: 4 mg via INTRAVENOUS
  Filled 2018-12-03: qty 2

## 2018-12-03 MED ORDER — FENTANYL CITRATE (PF) 100 MCG/2ML IJ SOLN
100.0000 ug | INTRAMUSCULAR | Status: DC | PRN
  Administered 2018-12-03 (×2): 100 ug via INTRAVENOUS
  Filled 2018-12-03 (×2): qty 2

## 2018-12-03 MED ORDER — LACTATED RINGERS IV SOLN: 500.0000 mL | INTRAVENOUS | Status: DC | PRN

## 2018-12-03 MED ORDER — LACTATED RINGERS IV SOLN: INTRAVENOUS | Status: DC

## 2018-12-03 MED ORDER — OXYTOCIN BOLUS FROM INFUSION: 500.0000 mL | Freq: Once | INTRAVENOUS | Status: DC

## 2018-12-03 MED ORDER — SENNOSIDES-DOCUSATE SODIUM 8.6-50 MG PO TABS
2.0000 | ORAL_TABLET | ORAL | Status: DC
  Administered 2018-12-03 – 2018-12-04 (×2): 2 via ORAL
  Filled 2018-12-03 (×2): qty 2

## 2018-12-03 MED ORDER — WITCH HAZEL-GLYCERIN EX PADS: 1.0000 "application " | MEDICATED_PAD | CUTANEOUS | Status: DC | PRN

## 2018-12-03 MED ORDER — ONDANSETRON HCL 4 MG/2ML IJ SOLN: 4.0000 mg | Freq: Four times a day (QID) | INTRAMUSCULAR | Status: DC | PRN

## 2018-12-03 MED ORDER — IBUPROFEN 600 MG PO TABS
600.0000 mg | ORAL_TABLET | Freq: Four times a day (QID) | ORAL | Status: DC
  Administered 2018-12-03 – 2018-12-05 (×9): 600 mg via ORAL
  Filled 2018-12-03 (×9): qty 1

## 2018-12-03 MED ORDER — LIDOCAINE HCL (PF) 1 % IJ SOLN
30.0000 mL | INTRAMUSCULAR | Status: AC | PRN
  Administered 2018-12-03: 30 mL via SUBCUTANEOUS
  Filled 2018-12-03: qty 30

## 2018-12-03 MED ORDER — SIMETHICONE 80 MG PO CHEW: 80.0000 mg | CHEWABLE_TABLET | ORAL | Status: DC | PRN

## 2018-12-03 MED ORDER — DIBUCAINE (PERIANAL) 1 % EX OINT: 1.0000 "application " | TOPICAL_OINTMENT | CUTANEOUS | Status: DC | PRN

## 2018-12-03 MED ORDER — OXYTOCIN BOLUS FROM INFUSION
500.0000 mL | Freq: Once | INTRAVENOUS | Status: AC
  Administered 2018-12-03: 500 mL via INTRAVENOUS

## 2018-12-03 MED ORDER — BENZOCAINE-MENTHOL 20-0.5 % EX AERO
1.0000 "application " | INHALATION_SPRAY | CUTANEOUS | Status: DC | PRN
  Filled 2018-12-03 (×2): qty 56

## 2018-12-03 MED ORDER — ONDANSETRON HCL 4 MG/2ML IJ SOLN
4.0000 mg | INTRAMUSCULAR | Status: DC | PRN
  Administered 2018-12-03: 4 mg via INTRAVENOUS
  Filled 2018-12-03: qty 2

## 2018-12-03 NOTE — Progress Notes (Signed)
LABOR PROGRESS NOTE  Lindsay Phillips is a 23 y.o. G2P0010 at [redacted]w[redacted]d  admitted for PROM.   Subjective: Doing well, contractions increasing in severity and frequency.   Objective: BP 107/66   Pulse 91   Temp 98.3 F (36.8 C) (Oral)   Resp 17   Ht 5\' 6"  (1.676 m)   Wt 88.9 kg   LMP 03/05/2018 (Exact Date)   BMI 31.63 kg/m  or  Vitals:   12/03/18 0303 12/03/18 0359 12/03/18 0449 12/03/18 0500  BP: 101/62 (!) 119/55 107/66   Pulse: 78 (!) 121 91   Resp: 17 17  17   Temp: 97.7 F (36.5 C)   98.3 F (36.8 C)  TempSrc: Oral   Oral  Weight:      Height:        Dilation: 6 Effacement (%): 90 Cervical Position: Middle Station: -1 Presentation: Vertex Exam by:: cwhite,rnc FHT: baseline rate 130, moderate varibility, +acel, -decel Toco: Every 1-4 min, difficulty tracing at times   Labs: Lab Results  Component Value Date   WBC 10.7 (H) 12/03/2018   HGB 12.2 12/03/2018   HCT 37.8 12/03/2018   MCV 86.9 12/03/2018   PLT 158 12/03/2018    Patient Active Problem List   Diagnosis Date Noted  . Labor and delivery, indication for care 12/03/2018  . Indication for care in labor and delivery, antepartum 12/03/2018  . Decreased fetal movement 11/25/2018  . Traumatic injury during pregnancy in third trimester 11/03/2018  . NST (non-stress test) reactive on fetal surveillance 11/03/2018  . Supervision of normal first pregnancy, antepartum 05/13/2018  . Rh negative status during pregnancy 05/13/2018    Assessment / Plan: 23 y.o. G2P0010 at [redacted]w[redacted]d here for PROM.   Labor: Progressing well on own, if contractions space or minimal change on next check, will start pit.  Fetal Wellbeing:  Cat 1  Pain Control:  IV fent currently, still unsure about epidural  Anticipated MOD:  SVD   Darrelyn Hillock, D.O. Family Medicine PGY-2  12/03/2018, 5:57 AM

## 2018-12-03 NOTE — Discharge Summary (Signed)
Postpartum Discharge Summary     Patient Name: Lindsay Phillips DOB: 03/03/1996 MRN: [id number]  Date of admission: 12/02/2018 Delivering Provider: Aviva SignsWILLIAMS, MARIE L   Date of discharge: 12/05/2018  Admitting diagnosis: 39WKS WATER BROKE, CTX Intrauterine pregnancy: 6037w0d     Secondary diagnosis:  Active Problems:   Labor and delivery, indication for care   Indication for care in labor and delivery, antepartum   Postpartum care following vaginal delivery   Postpartum hemorrhage  Additional problems: none     Discharge diagnosis: Term Pregnancy Delivered                                                                                                Post partum procedures:none  Augmentation: none necessary  Complications: Hemorrhage>103300mL (1426ml)  Hospital course:  Onset of Labor With Vaginal Delivery     23 y.o. yo G2P1011 at 6437w0d was admitted in Latent Labor on 12/02/2018. Patient had an uncomplicated labor course as follows:  Membrane Rupture Time/Date: 10:00 PM ,12/02/2018   She began to labor on her own shortly after arrival She progressed quickly from 1-2cm to 6cm then complete. Pushed only a short while to SVD She had a large blood loss prior to delivery of placenta, was treated with early Pitocin and Cytotec  Intrapartum Procedures: Episiotomy: None [1]                                         Lacerations:  1st degree [2];Perineal [11]  repaired under local anesthesia Patient had a delivery of a Viable infant. 12/03/2018  Information for the patient's newborn:  Britta MccreedyYoumans, Boy Aisha [[id number]][[id number]]  Delivery Method: Vaginal, Spontaneous(Filed from Delivery Summary)     Pateint had an uncomplicated postpartum course.  She is ambulating, tolerating a regular diet, passing flatus, and urinating well. Patient is discharged home in stable condition on 12/05/18.   Magnesium Sulfate recieved: No BMZ received: No  Physical exam  Vitals:   12/03/18 2330 12/04/18 0539 12/04/18 2130  12/05/18 0530  BP: 116/73 119/61 93/60 102/66  Pulse: (!) 104 91 94 89  Resp: 18 18 16 18   Temp: 97.9 F (36.6 C) 98.3 F (36.8 C) 98.4 F (36.9 C) 98.3 F (36.8 C)  TempSrc: Oral Oral Oral Oral  SpO2: 100% 100% 100% 100%  Weight:      Height:       General: alert, cooperative and no distress Lochia: appropriate Uterine Fundus: firm Incision: N/A DVT Evaluation: No evidence of DVT seen on physical exam. No significant calf/ankle edema. Labs: Lab Results  Component Value Date   WBC 14.9 (H) 12/04/2018   HGB 8.8 (L) 12/04/2018   HCT 28.0 (L) 12/04/2018   MCV 88.3 12/04/2018   PLT 134 (L) 12/04/2018   No flowsheet data found.  Discharge instruction: per After Visit Summary and "Baby and Me Booklet".  After visit meds:  Allergies as of 12/05/2018   No Known Allergies     Medication List    TAKE these medications  Fish Oil 1000 MG Caps Take 2,000 mg by mouth daily.   ibuprofen 600 MG tablet Commonly known as: ADVIL Take 1 tablet (600 mg total) by mouth every 6 (six) hours.   Iron 325 (65 Fe) MG Tabs Take 1 tablet (325 mg total) by mouth 2 (two) times daily.   prenatal multivitamin Tabs tablet Take 1 tablet by mouth daily at 12 noon.       Diet: routine diet  Activity: Advance as tolerated. Pelvic rest for 6 weeks.   Outpatient follow up:4 weeks Follow up Appt: Future Appointments  Date Time Provider [place]  01/22/2019 10:30 AM Truett Mainland, DO [facility] None   Follow up Visit: [place] Follow up on 01/22/2019.   Specialty: Obstetrics and Gynecology Why: for postpartum checkup Contact information: [place] [address] [phone]           Please schedule this patient for Postpartum visit in: 4 weeks with the following provider: Any provider For C/S patients schedule nurse incision check in weeks 2 weeks: no Low  risk pregnancy complicated by: PPH Delivery mode:  SVD Anticipated Birth Control:  POPs PP Procedures needed: none  Schedule Integrated BH visit: no      Newborn Data: Live born female  Birth Weight: 6 lb 8.8 oz (2970 g) APGAR: 9, 9  Newborn Delivery   Birth date/time: 12/03/2018 07:15:00 Delivery type: Vaginal, Spontaneous      Baby Feeding: Breast Disposition:home with mother   12/05/2018 Jorje Guild, NP

## 2018-12-03 NOTE — Progress Notes (Signed)
Patient ID: Lindsay Phillips, female   DOB: 02-17-1996, 23 y.o.   MRN: [id number] Patient progressed to 6cm at 0440 and is now completely dilated since 0640  Vitals:   12/03/18 0449 12/03/18 0500 12/03/18 0600 12/03/18 0606  BP: 107/66   116/67  Pulse: 91   90  Resp:  17 17   Temp:  98.3 F (36.8 C)    TempSrc:  Oral    Weight:      Height:       FHR reassuring UCs every 2 min  Dilation: (P) 10 Effacement (%): 90 Cervical Position: Middle Station: -1 Presentation: Vertex Exam by:: [email or web address],[email or web address]  WIll begin pushing and anticipate SVD

## 2018-12-03 NOTE — H&P (Addendum)
LABOR AND DELIVERY ADMISSION HISTORY AND PHYSICAL NOTE  Lindsay PollockMariah Youmans is a 23 y.o. female G2P0010 with IUP at 1760w0d by 9 wk U/S presenting for PROM. Leakage of fluid around 10pm on 7/7 with increasing painful contractions afterwards.   She reports positive fetal movement. She denies vaginal bleeding.  Prenatal History/Complications: PNC at [facility]  Pregnancy complications:  - Rh Negative status, received Rho GAM at 28 weeks  - Decreased fetal movement on 11/25/2018 with reactive NST at that time   Past Medical History: Past Medical History:  Diagnosis Date  . Medical history non-contributory     Past Surgical History: Past Surgical History:  Procedure Laterality Date  . INDUCED ABORTION      Obstetrical History: OB History    Gravida  2   Para      Term      Preterm      AB  1   Living        SAB      TAB  1   Ectopic      Multiple      Live Births              Social History: Social History   Socioeconomic History  . Marital status: Significant Other    Spouse name: Not on file  . Number of children: Not on file  . Years of education: Not on file  . Highest education level: Not on file  Occupational History  . Not on file  Social Needs  . Financial resource strain: Not on file  . Food insecurity    Worry: Not on file    Inability: Not on file  . Transportation needs    Medical: Not on file    Non-medical: Not on file  Tobacco Use  . Smoking status: Never Smoker  . Smokeless tobacco: Never Used  Substance and Sexual Activity  . Alcohol use: Not Currently    Frequency: Never    Comment: not in pregnancy  . Drug use: Never  . Sexual activity: Yes    Birth control/protection: None  Lifestyle  . Physical activity    Days per week: Not on file    Minutes per session: Not on file  . Stress: Not on file  Relationships  . Social Musicianconnections    Talks on phone: Not on file    Gets together: Not on file    Attends religious  service: Not on file    Active member of club or organization: Not on file    Attends meetings of clubs or organizations: Not on file    Relationship status: Not on file  Other Topics Concern  . Not on file  Social History Narrative  . Not on file    Family History: Family History  Problem Relation Age of Onset  . Healthy Mother   . Healthy Father   . Hypertension Maternal Grandmother     Allergies: No Known Allergies  Medications Prior to Admission  Medication Sig Dispense Refill Last Dose  . Omega-3 Fatty Acids (FISH OIL PO) Take by mouth.   12/01/2018 at Unknown time  . Prenatal Vit-Fe Fumarate-FA (PRENATAL MULTIVITAMIN) TABS tablet Take 1 tablet by mouth daily at 12 noon.   12/01/2018 at Unknown time     Review of Systems  All systems reviewed and negative except as stated in HPI  Physical Exam Blood pressure 133/78, pulse 98, temperature 99.1 F (37.3 C), temperature source Oral, resp. rate 18, height  5\' 6"  (1.676 m), weight 88.9 kg, last menstrual period 03/05/2018. General appearance: alert, oriented, NAD Lungs: normal respiratory effort Heart: regular rate Abdomen: soft, non-tender; gravid, FH appropriate for GA Extremities: No calf swelling or tenderness Presentation: cephalic Fetal monitoring: 150 mod var, + accel, - decel  Uterine activity: Irregular  Dilation: 1.5 Effacement (%): 70 Station: -1 Exam by:: DCALLAWAY, RN  Prenatal labs: ABO, Rh: --/--/O NEG (07/08 0020) Antibody: NEG (07/08 0020) Rubella: 3.81 (12/17 0944) RPR: Non Reactive (04/24 0917)  HBsAg: Negative (12/17 0944)  HIV: Non Reactive (04/24 0917)  GC/Chlamydia: negative  GBS:  Negative  2-hr GTT: Normal  Genetic screening:  Normal  Anatomy US: Normal  Prenatal Transfer Tool  Maternal Diabetes: No Genetic Screening: Normal Maternal Ultrasounds/Referrals: Normal Fetal Ultrasounds or other Referrals:  None Maternal Substance Abuse:  No Significant Maternal Medications:   None Significant Maternal Lab Results: Group B Strep negative and Rh negative  Results for orders placed or performed during the hospital encounter of 12/02/18 (from the past 24 hour(s))  CBC   Collection Time: 12/03/18 12:20 AM  Result Value Ref Range   WBC 10.7 (H) 4.0 - 10.5 K/uL   RBC 4.35 3.87 - 5.11 MIL/uL   Hemoglobin 12.2 12.0 - 15.0 g/dL   HCT 37.8 36.0 - 46.0 %   MCV 86.9 80.0 - 100.0 fL   MCH 28.0 26.0 - 34.0 pg   MCHC 32.3 30.0 - 36.0 g/dL   RDW 14.9 11.5 - 15.5 %   Platelets 158 150 - 400 K/uL   nRBC 0.0 0.0 - 0.2 %  Type and screen [place]   Collection Time: 12/03/18 12:20 AM  Result Value Ref Range   ABO/RH(D) O NEG    Antibody Screen NEG    Sample Expiration      12/06/2018,2359 Performed at [facility], [address] [place], [place] [address]     Patient Active Problem List   Diagnosis Date Noted  . Labor and delivery, indication for care 12/03/2018  . Indication for care in labor and delivery, antepartum 12/03/2018  . Decreased fetal movement 11/25/2018  . Traumatic injury during pregnancy in third trimester 11/03/2018  . NST (non-stress test) reactive on fetal surveillance 11/03/2018  . Supervision of normal first pregnancy, antepartum 05/13/2018  . Rh negative status during pregnancy 05/13/2018    Assessment: Lindsay Phillips is a 24 y.o. G2P0010 at [redacted]w[redacted]d here for PROM of clear fluid at 2200 on 7/7.   #Labor: Expectant management, latent phase, may consider augmentation on next check if minimal to no progression.  #Pain: IV fent currently, debating epidural  #FWB: Cat 1 strip  #ID: GBS negative  #MOF: Breastfeeding  #MOC: POPs #Circ: yes, inpatient   1. Rh negative status:  Received Rho GAM at 28 weeks. Will need postpartum RhoGAM evaluation.   Patriciaann Clan 12/03/2018, 1:14 AM   I confirm that I have verified the information documented in the resident's note and that I have also personally reperformed the  physical exam and all medical decision making activities. The patient was seen and examined by me also Agree with note NST reactive and reassuring UCs as listed Cervical exams as listed in note Plan to proceed expectantly as she is increasing severity of contractions Fentanyl ordered for pain. Declines epidural for now  Wants to walk around  [place]ron [place], [place]

## 2018-12-03 NOTE — Progress Notes (Signed)
Patient ID: Lindsay Phillips, female   DOB: 11-22-1995, 23 y.o.   MRN: [id number] Feeling rectal pressure RN checked cervix .Is now 2-3 cm  Vitals:   12/02/18 2341 12/03/18 0106 12/03/18 0109 12/03/18 0207  BP: 133/78  130/76 116/60  Pulse: 98  90 (!) 110  Resp:  17    Temp:  98.2 F (36.8 C)    TempSrc:  Oral    Weight:  88.9 kg    Height:  5\' 6"  (1.676 m)     FHR reassuring UCs q 2-5 min  Will continue to observe Analgesia prn

## 2018-12-04 LAB — CBC
HCT: 28 % — ABNORMAL LOW (ref 36.0–46.0)
Hemoglobin: 8.8 g/dL — ABNORMAL LOW (ref 12.0–15.0)
MCH: 27.8 pg (ref 26.0–34.0)
MCHC: 31.4 g/dL (ref 30.0–36.0)
MCV: 88.3 fL (ref 80.0–100.0)
Platelets: 134 10*3/uL — ABNORMAL LOW (ref 150–400)
RBC: 3.17 MIL/uL — ABNORMAL LOW (ref 3.87–5.11)
RDW: 15.2 % (ref 11.5–15.5)
WBC: 14.9 10*3/uL — ABNORMAL HIGH (ref 4.0–10.5)
nRBC: 0 % (ref 0.0–0.2)

## 2018-12-04 MED ORDER — IRON 325 (65 FE) MG PO TABS: 1.0000 | ORAL_TABLET | Freq: Two times a day (BID) | ORAL | 0 refills | Status: DC

## 2018-12-04 MED ORDER — RHO D IMMUNE GLOBULIN 1500 UNIT/2ML IJ SOSY
300.0000 ug | PREFILLED_SYRINGE | Freq: Once | INTRAMUSCULAR | Status: AC
  Administered 2018-12-04: 300 ug via INTRAMUSCULAR
  Filled 2018-12-04: qty 2

## 2018-12-04 NOTE — Progress Notes (Signed)
Post Partum Day 1 Subjective: no complaints, up ad lib, voiding, tolerating PO and Wants to stay until tomorrow to get more help with baby  Objective: Blood pressure 119/61, pulse 91, temperature 98.3 F (36.8 C), temperature source Oral, resp. rate 18, height 5\' 6"  (1.676 m), weight 88.9 kg, last menstrual period 03/05/2018, SpO2 100 %, unknown if currently breastfeeding.  Physical Exam:  General: alert, cooperative and no distress Lochia: appropriate Uterine Fundus: firm Incision: n/a DVT Evaluation: No evidence of DVT seen on physical exam.  Recent Labs    12/03/18 0020 12/04/18 0547  HGB 12.2 8.8*  HCT 37.8 28.0*    Assessment/Plan: Plan for discharge tomorrow, Breastfeeding and Lactation consult   LOS: 1 day   Hansel Feinstein 12/04/2018, 7:39 AM

## 2018-12-04 NOTE — Lactation Note (Signed)
This note was copied from a baby's chart. Lactation Consultation Note Baby 25 hrs old. Baby BF well. Mom states BF going well. Mom has everted nipples. Hand expression w/colostrum noted.  Feeding baby in football hold, LC adjusted pillows for support. Baby BF w/good body alignment. Heard occasional swallows.  Newborn behavior, feeding habits, STS, I&O, supply and demand discussed.  Breast massage and care discussed. Mom will call for assistance or questions. Lactation brochure given.  Patient Name: Lindsay Phillips [id number] Date: 12/04/2018 Reason for consult: Initial assessment;1st time breastfeeding;Term   Maternal Data Has patient been taught Hand Expression?: Yes Does the patient have breastfeeding experience prior to this delivery?: No  Feeding Feeding Type: Breast Fed  LATCH Score Latch: Grasps breast easily, tongue down, lips flanged, rhythmical sucking.  Audible Swallowing: A few with stimulation  Type of Nipple: Everted at rest and after stimulation  Comfort (Breast/Nipple): Soft / non-tender  Hold (Positioning): Assistance needed to correctly position infant at breast and maintain latch.  LATCH Score: 8  Interventions Interventions: Breast feeding basics reviewed;Adjust position;Assisted with latch;Support pillows;Skin to skin;Position options;Breast massage;Hand express;Breast compression  Lactation Tools Discussed/Used WIC Program: Yes   Consult Status Consult Status: Follow-up Date: 12/05/18 Follow-up type: In-patient    Dorn Hartshorne, [place]ta [place] 12/04/2018, 6:12 AM

## 2018-12-05 LAB — RH IG WORKUP (INCLUDES ABO/RH)
ABO/RH(D): O NEG
Fetal Screen: NEGATIVE
Gestational Age(Wks): 39
Unit division: 0

## 2018-12-05 MED ORDER — IBUPROFEN 600 MG PO TABS: 600.0000 mg | ORAL_TABLET | Freq: Four times a day (QID) | ORAL | 0 refills | Status: DC

## 2018-12-05 NOTE — Discharge Instructions (Signed)
Postpartum Care After Vaginal Delivery °This sheet gives you information about how to care for yourself from the time you deliver your baby to up to 6-12 weeks after delivery (postpartum period). Your health care provider may also give you more specific instructions. If you have problems or questions, contact your health care provider. °Follow these instructions at home: °Vaginal bleeding °· It is normal to have vaginal bleeding (lochia) after delivery. Wear a sanitary pad for vaginal bleeding and discharge. °? During the first week after delivery, the amount and appearance of lochia is often similar to a menstrual period. °? Over the next few weeks, it will gradually decrease to a dry, yellow-brown discharge. °? For most women, lochia stops completely by 4-6 weeks after delivery. Vaginal bleeding can vary from woman to woman. °· Change your sanitary pads frequently. Watch for any changes in your flow, such as: °? A sudden increase in volume. °? A change in color. °? Large blood clots. °· If you pass a blood clot from your vagina, save it and call your health care provider to discuss. Do not flush blood clots down the toilet before talking with your health care provider. °· Do not use tampons or douches until your health care provider says this is safe. °· If you are not breastfeeding, your period should return 6-8 weeks after delivery. If you are feeding your child breast milk only (exclusive breastfeeding), your period may not return until you stop breastfeeding. °Perineal care °· Keep the area between the vagina and the anus (perineum) clean and dry as told by your health care provider. Use medicated pads and pain-relieving sprays and creams as directed. °· If you had a cut in the perineum (episiotomy) or a tear in the vagina, check the area for signs of infection until you are healed. Check for: °? More redness, swelling, or pain. °? Fluid or blood coming from the cut or tear. °? Warmth. °? Pus or a bad  smell. °· You may be given a squirt bottle to use instead of wiping to clean the perineum area after you go to the bathroom. As you start healing, you may use the squirt bottle before wiping yourself. Make sure to wipe gently. °· To relieve pain caused by an episiotomy, a tear in the vagina, or swollen veins in the anus (hemorrhoids), try taking a warm sitz bath 2-3 times a day. A sitz bath is a warm water bath that is taken while you are sitting down. The water should only come up to your hips and should cover your buttocks. °Breast care °· Within the first few days after delivery, your breasts may feel heavy, full, and uncomfortable (breast engorgement). Milk may also leak from your breasts. Your health care provider can suggest ways to help relieve the discomfort. Breast engorgement should go away within a few days. °· If you are breastfeeding: °? Wear a bra that supports your breasts and fits you well. °? Keep your nipples clean and dry. Apply creams and ointments as told by your health care provider. °? You may need to use breast pads to absorb milk that leaks from your breasts. °? You may have uterine contractions every time you breastfeed for up to several weeks after delivery. Uterine contractions help your uterus return to its normal size. °? If you have any problems with breastfeeding, work with your health care provider or lactation consultant. °· If you are not breastfeeding: °? Avoid touching your breasts a lot. Doing this can make   your breasts produce more milk. °? Wear a good-fitting bra and use cold packs to help with swelling. °? Do not squeeze out (express) milk. This causes you to make more milk. °Intimacy and sexuality °· Ask your health care provider when you can engage in sexual activity. This may depend on: °? Your risk of infection. °? How fast you are healing. °? Your comfort and desire to engage in sexual activity. °· You are able to get pregnant after delivery, even if you have not had  your period. If desired, talk with your health care provider about methods of birth control (contraception). °Medicines °· Take over-the-counter and prescription medicines only as told by your health care provider. °· If you were prescribed an antibiotic medicine, take it as told by your health care provider. Do not stop taking the antibiotic even if you start to feel better. °Activity °· Gradually return to your normal activities as told by your health care provider. Ask your health care provider what activities are safe for you. °· Rest as much as possible. Try to rest or take a nap while your baby is sleeping. °Eating and drinking ° °· Drink enough fluid to keep your urine pale yellow. °· Eat high-fiber foods every day. These may help prevent or relieve constipation. High-fiber foods include: °? Whole grain cereals and breads. °? Brown rice. °? Beans. °? Fresh fruits and vegetables. °· Do not try to lose weight quickly by cutting back on calories. °· Take your prenatal vitamins until your postpartum checkup or until your health care provider tells you it is okay to stop. °Lifestyle °· Do not use any products that contain nicotine or tobacco, such as cigarettes and e-cigarettes. If you need help quitting, ask your health care provider. °· Do not drink alcohol, especially if you are breastfeeding. °General instructions °· Keep all follow-up visits for you and your baby as told by your health care provider. Most women visit their health care provider for a postpartum checkup within the first 3-6 weeks after delivery. °Contact a health care provider if: °· You feel unable to cope with the changes that your child brings to your life, and these feelings do not go away. °· You feel unusually sad or worried. °· Your breasts become red, painful, or hard. °· You have a fever. °· You have trouble holding urine or keeping urine from leaking. °· You have little or no interest in activities you used to enjoy. °· You have not  breastfed at all and you have not had a menstrual period for 12 weeks after delivery. °· You have stopped breastfeeding and you have not had a menstrual period for 12 weeks after you stopped breastfeeding. °· You have questions about caring for yourself or your baby. °· You pass a blood clot from your vagina. °Get help right away if: °· You have chest pain. °· You have difficulty breathing. °· You have sudden, severe leg pain. °· You have severe pain or cramping in your lower abdomen. °· You bleed from your vagina so much that you fill more than one sanitary pad in one hour. Bleeding should not be heavier than your heaviest period. °· You develop a severe headache. °· You faint. °· You have blurred vision or spots in your vision. °· You have bad-smelling vaginal discharge. °· You have thoughts about hurting yourself or your baby. °If you ever feel like you may hurt yourself or others, or have thoughts about taking your own life, get help   right away. You can go to the nearest emergency department or call:  Your local emergency services (911 in the [place]  A suicide crisis helpline, such as the National Suicide Prevention Lifeline at 430-022-69281-205-518-9332. This is open 24 hours a day. Summary  The period of time right after you deliver your newborn up to 6-12 weeks after delivery is called the postpartum period.  Gradually return to your normal activities as told by your health care provider.  Keep all follow-up visits for you and your baby as told by your health care provider. This information is not intended to replace advice given to you by your health care provider. Make sure you discuss any questions you have with your health care provider. Document Released: 03/11/2007 Document Revised: 05/17/2017 Document Reviewed: 02/25/2017 Elsevier Patient Education  2020 [employer]       Care of a Perineal Tear A perineal tear is a cut or tear (laceration) in the tissue between the opening of the vagina  and the anus (perineum). Some women develop a perineal tear during a vaginal birth. This can happen as the baby emerges from the birth canal and the perineum is stretched. There are four degrees of perineal tears based on how deep and long the laceration is:  First degree. This involves a shallow tear at the edge of the vaginal opening that extends slightly into the perineal skin.  Second degree. This involves tearing described in first degree perineal tear, and an additional deeper tear of the vaginal opening and perineal tissues. It may also include tearing of a muscle just under the perineal skin.  Third degree. This involves tearing described in first and second degree perineal tears, with the addition that tearing in the third degree extends into the muscle of the anus (anal sphincter).  Fourth degree. This involves all levels of tears described in first, second, and third degree perineal tears, with the tear in the fourth degree extending into the rectum. First and second degree perineal tears may or may not be stitched closed, depending on their location and appearance. Third and fourth degree perineal tears are stitched closed immediately after the babys birth. What are the risks? Depending on the type of perineal tear you have, you may be at risk for:  Bleeding.  Developing a collection of blood in the perineal tear area (hematoma).  Pain. This may include pain when you urinate, or pain when you have a bowel movement.  Infection at the site of the tear.  Fever.  Trouble controlling your urination or bowels (incontinence).  Painful sex. How to care for a perineal tear Wound care  Take a sitz bath as told by your health care provider. A sitz bath is a warm water bath that is taken while you are sitting down. The water should only come up to your hips and should cover your buttocks. This can speed up healing. ? Partially fill a bathtub with warm water. You will only need the  water to be deep enough to cover your hips and buttocks when you are sitting in it. ? If your health care provider told you to put medicine in the water, follow the directions exactly as told. ? Sit in the water and open the tub drain a little. ? Turn on the warm water again to keep the tub at the correct level. Keep the water running constantly. ? Soak in the water for 15-20 minutes or as told by your health care provider. ? After the sitz  bath, pat the affected area dry first. Do not rub it. ? Be careful when you stand up after the sitz bath because you may feel dizzy.  Wash your hands before and after applying medicine to the area.  Wear a sanitary pad as told by your health care provider. Change the pad as often as told by your health care provider.  Leave stitches (sutures), skin glue, or adhesive strips in place. These skin closures may need to stay in place for 2 weeks or longer. If adhesive strip edges start to loosen and curl up, you may trim the loose edges. Do not remove adhesive strips completely unless your health care provider tells you to do that.  Check your wound every day for signs of infection. Check for: ? Redness, swelling, or pain. ? Fluid or blood. ? Warmth. ? Pus or a bad smell. Managing pain  If directed, put ice on the painful area: ? Put ice in a plastic bag. ? Place a towel between your skin and the bag. ? Leave the ice on for 20 minutes, 2-3 times a day.  Apply a numbing spray to the perineal tear site as told by your health care provider. This may help with discomfort.  Take and apply over-the-counter and prescription medicines only as told by your health care provider.  If told, put about 3 witch hazel-containing hemorrhoid treatment pads on top of your sanitary pad. The witch hazel in the hemorrhoid pads helps with swelling and discomfort.  Sit on an inflatable ring or pillow. This may provide comfort. General instructions  Squeeze warm water on your  perineum after urinating. This should be done from front to back with a squeeze bottle. Pat the area to dry it.  Do not have sex, use tampons, or place anything in your vagina for at least 6 weeks or as told by your health care provider.  Keep all follow-up visits as told by your health care provider. These include any postpartum visits. This is important. Contact a health care provider if:  Your pain is not relieved with medicines.  You have painful urination.  You have redness, swelling, or pain around your tear.  You have fluid or blood coming from your tear.  Your tear feels warm to the touch.  You have pus or a bad smell coming from your tear.  You have a fever. Get help right away if:  Your tear opens.  You cannot urinate.  You have an increase in bleeding.  You have severe pain. Summary  A perineal tear is a cut or tear (laceration) in the tissue between the opening of the vagina and the anus (perineum).  There are four degrees of perineal tears based on how deep and long the laceration is.  First and second-degree perineal tears may or may not be stitched closed, depending on their location and appearance. Third and fourth- degree perineal tears are stitched closed immediately after the babys birth.  Follow your health care provider's instructions for caring for your perineal tear. Know how to manage pain and how to care for your wound. Know when to call your health care provider and when to seek immediate emergency care. This information is not intended to replace advice given to you by your health care provider. Make sure you discuss any questions you have with your health care provider. Document Released: 09/28/2013 Document Revised: 04/26/2017 Document Reviewed: 06/18/2016 Elsevier Patient Education  2020 [employer].

## 2018-12-05 NOTE — Lactation Note (Signed)
This note was copied from a baby's chart. Lactation Consultation Note  Patient Name: Lindsay Phillips [id number] Date: 12/05/2018 Reason for consult: Follow-up assessment;Term;Primapara;1st time breastfeeding  P1 mother whose infant is now 70 hours old.  Baby was asleep on mother's chest when I arrived.  Mother feels like breast feeding is going well.  Her breasts are soft and non tender and nipples are everted and intact.    She will continue to feed 8-12 times/24 hours or sooner if baby shows feeding cues.  Discussed milk coming to volume and answered a few questions mother had regarding baby.  She has a DEBP for home use and I provided a manual pump also.  #24 flange size is appropriate at this time.  Father present and asleep on the couch.  Mother has our OP phone number for questions after discharge.   Maternal Data Formula Feeding for Exclusion: No Has patient been taught Hand Expression?: Yes Does the patient have breastfeeding experience prior to this delivery?: No  Feeding Feeding Type: Breast Fed  LATCH Score                   Interventions    Lactation Tools Discussed/Used Pump Review: Setup, frequency, and cleaning Initiated by:: Embree Brawley Date initiated:: 12/06/18   Consult Status Consult Status: Complete Date: 12/05/18 Follow-up type: Call as needed    Suprena Travaglini R Hendy Brindle 12/05/2018, 8:18 AM

## 2018-12-08 ENCOUNTER — Encounter: Payer: Medicaid Other | Admitting: Obstetrics & Gynecology

## 2018-12-27 ENCOUNTER — Other Ambulatory Visit: Payer: Self-pay | Admitting: Advanced Practice Midwife

## 2019-01-10 ENCOUNTER — Other Ambulatory Visit: Payer: Self-pay | Admitting: Advanced Practice Midwife

## 2019-01-22 ENCOUNTER — Ambulatory Visit: Payer: Medicaid Other | Admitting: Family Medicine

## 2019-01-23 ENCOUNTER — Encounter: Payer: Self-pay | Admitting: Family Medicine

## 2019-01-23 ENCOUNTER — Ambulatory Visit (INDEPENDENT_AMBULATORY_CARE_PROVIDER_SITE_OTHER): Payer: BC Managed Care – PPO | Admitting: Family Medicine

## 2019-01-23 ENCOUNTER — Other Ambulatory Visit: Payer: Self-pay

## 2019-01-23 DIAGNOSIS — M546 Pain in thoracic spine: Secondary | ICD-10-CM | POA: Diagnosis not present

## 2019-01-23 DIAGNOSIS — M9902 Segmental and somatic dysfunction of thoracic region: Secondary | ICD-10-CM | POA: Diagnosis not present

## 2019-01-23 MED ORDER — NORETHINDRONE 0.35 MG PO TABS: 1.0000 | ORAL_TABLET | Freq: Every day | ORAL | 3 refills | Status: DC

## 2019-01-23 NOTE — Progress Notes (Signed)
Subjective:     Lindsay Phillips is a 23 y.o. female who presents for a postpartum visit. She is 7 weeks postpartum following a spontaneous vaginal delivery. I have fully reviewed the prenatal and intrapartum course. The delivery was at 28 gestational weeks. Outcome: spontaneous vaginal delivery. Anesthesia: local. Postpartum course has been normal. Baby's course has been normal. Baby is feeding by breast. Bleeding no bleeding. Bowel function is normal. Bladder function is normal. Patient is not sexually active. Contraception method is oral progesterone-only contraceptive. Postpartum depression screening: negative.  The following portions of the patient's history were reviewed and updated as appropriate: allergies, current medications, past family history, past medical history, past social history, past surgical history and problem list.  Review of Systems Pertinent items are noted in HPI.   Objective:    LMP 03/05/2018 (Exact Date)   General:  alert, cooperative and no distress  Lungs: clear to auscultation bilaterally  Heart:  regular rate and rhythm, S1, S2 normal, no murmur, click, rub or gallop  Abdomen: soft, non-tender; bowel sounds normal; no masses,  no organomegaly   MSK:  Tenderness in midthoracic area with paraspinal muscle spasm.  OSE: T7 FSRR, T8 FSRL        Assessment:     1. Normal postpartum exam.  2. Thoracic back pain 3. Somatic dysfunction thoracic.   Plan:    1. Contraception: oral progesterone-only contraceptive 2. OMT done after patient permission. HVLA technique utilized. 1 areas treated with improvement of tissue texture and joint mobility. Patient tolerated procedure well.   3. Follow up in: 1 year or as needed.

## 2019-03-05 ENCOUNTER — Ambulatory Visit: Payer: BC Managed Care – PPO | Admitting: Family Medicine

## 2019-04-14 ENCOUNTER — Emergency Department (HOSPITAL_COMMUNITY)
Admission: EM | Admit: 2019-04-14 | Discharge: 2019-04-14 | Disposition: A | Discharge status: Home / Self Care | Payer: BC Managed Care – PPO | Attending: Emergency Medicine | Admitting: Emergency Medicine

## 2019-04-14 ENCOUNTER — Other Ambulatory Visit: Payer: Self-pay | Admitting: Family Medicine

## 2019-04-14 ENCOUNTER — Encounter (HOSPITAL_COMMUNITY): Payer: Self-pay

## 2019-04-14 ENCOUNTER — Other Ambulatory Visit: Payer: Self-pay

## 2019-04-14 DIAGNOSIS — R55 Syncope and collapse: Secondary | ICD-10-CM | POA: Diagnosis present

## 2019-04-14 DIAGNOSIS — R1011 Right upper quadrant pain: Secondary | ICD-10-CM

## 2019-04-14 DIAGNOSIS — R519 Headache, unspecified: Secondary | ICD-10-CM | POA: Insufficient documentation

## 2019-04-14 DIAGNOSIS — Z5321 Procedure and treatment not carried out due to patient leaving prior to being seen by health care provider: Secondary | ICD-10-CM | POA: Diagnosis not present

## 2019-04-14 DIAGNOSIS — R5383 Other fatigue: Secondary | ICD-10-CM | POA: Insufficient documentation

## 2019-04-14 MED ORDER — SODIUM CHLORIDE 0.9% FLUSH: 3.0000 mL | Freq: Once | INTRAVENOUS | Status: DC

## 2019-04-14 NOTE — ED Triage Notes (Signed)
Pt reports an episode of syncope earlier today. States that she has been feels fatigued and had headaches for about a week. She states that she was seen by her PCP yesterday and has a gallbladder US scheduled tomorrow based on some lab work and reports some abdominal pain today, but that is not her primary concern. Pt is 4 months post partum.

## 2019-04-16 ENCOUNTER — Ambulatory Visit
Admission: RE | Admit: 2019-04-16 | Discharge: 2019-04-16 | Disposition: A | Discharge status: Home / Self Care | Payer: BC Managed Care – PPO | Source: Ambulatory Visit | Attending: Family Medicine | Admitting: Family Medicine

## 2019-04-16 DIAGNOSIS — R1011 Right upper quadrant pain: Secondary | ICD-10-CM

## 2019-08-13 ENCOUNTER — Encounter: Payer: Self-pay | Admitting: Family Medicine

## 2019-08-13 ENCOUNTER — Other Ambulatory Visit: Payer: Self-pay

## 2019-08-13 ENCOUNTER — Ambulatory Visit (INDEPENDENT_AMBULATORY_CARE_PROVIDER_SITE_OTHER): Payer: BC Managed Care – PPO | Admitting: Family Medicine

## 2019-08-13 VITALS — BP 113/67 | HR 63 | Ht 67.0 in | Wt 167.0 lb

## 2019-08-13 DIAGNOSIS — N6320 Unspecified lump in the left breast, unspecified quadrant: Secondary | ICD-10-CM

## 2019-08-13 DIAGNOSIS — Z0000 Encounter for general adult medical examination without abnormal findings: Secondary | ICD-10-CM

## 2019-08-13 NOTE — Progress Notes (Signed)
   Subjective:    Patient ID: Lindsay Phillips, female    DOB: Dec 15, 1995, 24 y.o.   MRN: [id number]  HPI Patient seen for concerns of a mobile breast lump that she feels intermittently since Friday. Feels area in the left upper breast. Occasionally painful. Still breastfeeding. Doesn't seem to go away with pumping. No decrease in milk production.  Denies family history of breast cancer.   Review of Systems     Objective:   Physical Exam Vitals reviewed. Exam conducted with a chaperone present.  Constitutional:      Appearance: Normal appearance.  Cardiovascular:     Rate and Rhythm: Normal rate.     Pulses: Normal pulses.  Pulmonary:     Effort: Pulmonary effort is normal.  Chest:     Chest wall: No mass, lacerations, deformity, swelling, tenderness, crepitus or edema.     Comments: Has dense breast tissue in the upper breast region. No distinct mass palpated. Skin:    Capillary Refill: Capillary refill takes less than 2 seconds.  Neurological:     Mental Status: She is alert.  Psychiatric:        Mood and Affect: Mood normal.        Behavior: Behavior normal.        Thought Content: Thought content normal.        Judgment: Judgment normal.        Assessment & Plan:  1. Normal female breast exam Reassuring exam - no distinct breast mass palpated. Patient needs an annual exam soon - will re-examine at that time.

## 2019-08-20 ENCOUNTER — Telehealth: Payer: Self-pay

## 2019-08-20 NOTE — Telephone Encounter (Signed)
Pt called wanting to know is it safe to receive the COVID-19 vaccine while breastfeeding? Pt made aware that per Dr. Adrian Blackwater, it is safe to receive the COVID-19 vaccine while breastfeeding. Understanding was voiced.  Ileta Ofarrell l Victorine Mcnee, CMA

## 2019-09-15 ENCOUNTER — Ambulatory Visit: Payer: BC Managed Care – PPO | Admitting: Family Medicine

## 2019-09-24 ENCOUNTER — Ambulatory Visit: Payer: BC Managed Care – PPO | Admitting: Family Medicine

## 2019-10-16 ENCOUNTER — Ambulatory Visit: Payer: BC Managed Care – PPO | Admitting: Family Medicine

## 2019-11-10 ENCOUNTER — Ambulatory Visit: Payer: BC Managed Care – PPO | Admitting: Advanced Practice Midwife

## 2019-11-20 ENCOUNTER — Other Ambulatory Visit: Payer: Self-pay

## 2019-11-20 ENCOUNTER — Other Ambulatory Visit (HOSPITAL_COMMUNITY)
Admission: RE | Admit: 2019-11-20 | Discharge: 2019-11-20 | Disposition: A | Discharge status: Home / Self Care | Payer: BC Managed Care – PPO | Source: Ambulatory Visit | Attending: Family Medicine | Admitting: Family Medicine

## 2019-11-20 ENCOUNTER — Ambulatory Visit (INDEPENDENT_AMBULATORY_CARE_PROVIDER_SITE_OTHER): Payer: BC Managed Care – PPO | Admitting: Family Medicine

## 2019-11-20 ENCOUNTER — Encounter: Payer: Self-pay | Admitting: Family Medicine

## 2019-11-20 VITALS — BP 114/72 | HR 70 | Wt 153.0 lb

## 2019-11-20 DIAGNOSIS — Z01419 Encounter for gynecological examination (general) (routine) without abnormal findings: Secondary | ICD-10-CM

## 2019-11-20 NOTE — Progress Notes (Signed)
GYNECOLOGY ANNUAL PREVENTATIVE CARE ENCOUNTER NOTE  Subjective:   Lindsay Phillips is a 24 y.o. G37P1011 female here for a routine annual gynecologic exam.  Current complaints: none.   Denies abnormal vaginal bleeding, discharge, pelvic pain, problems with intercourse or other gynecologic concerns.    Gynecologic History No LMP recorded. Patient is sexually active  Contraception: none - attempting to get pregnant Last Pap: 3 years ago. Results were: normal Last mammogram: n/a  Obstetric History OB History  Gravida Para Term Preterm AB Living  2 1 1   1 1   SAB TAB Ectopic Multiple Live Births    1   0 1    # Outcome Date GA Lbr Len/2nd Weight Sex Delivery Anes PTL Lv  2 Term 12/03/18 [redacted]w[redacted]d 06:48 / 00:27 6 lb 8.8 oz (2.97 kg) M Vag-Spont None  LIV  1 TAB 06/2016            Past Medical History:  Diagnosis Date  . Medical history non-contributory     Past Surgical History:  Procedure Laterality Date  . INDUCED ABORTION      Current Outpatient Medications on File Prior to Visit  Medication Sig Dispense Refill  . cholecalciferol (VITAMIN D3) 25 MCG (1000 UT) tablet Take 1,000 Units by mouth daily.    . ferrous sulfate 325 (65 FE) MG tablet TAKE 1 TABLET BY MOUTH TWICE A DAY (Patient not taking: Reported on 01/23/2019) 180 tablet 1  . ibuprofen (ADVIL) 600 MG tablet Take 1 tablet (600 mg total) by mouth every 6 (six) hours. (Patient not taking: Reported on 01/23/2019) 30 tablet 0  . norethindrone (ORTHO MICRONOR) 0.35 MG tablet Take 1 tablet (0.35 mg total) by mouth daily. 3 Package 3  . Omega-3 Fatty Acids (FISH OIL) 1000 MG CAPS Take 2,000 mg by mouth daily.    . Prenatal Vit-Fe Fumarate-FA (PRENATAL MULTIVITAMIN) TABS tablet Take 1 tablet by mouth daily at 12 noon.     No current facility-administered medications on file prior to visit.    No Known Allergies  Social History   Socioeconomic History  . Marital status: Married    Spouse name: Not on file  . Number of  children: 1  . Years of education: Not on file  . Highest education level: Not on file  Occupational History  . Not on file  Tobacco Use  . Smoking status: Never Smoker  . Smokeless tobacco: Never Used  Vaping Use  . Vaping Use: Never used  Substance and Sexual Activity  . Alcohol use: Not Currently    Comment: not in pregnancy  . Drug use: Never  . Sexual activity: Yes    Birth control/protection: None  Other Topics Concern  . Not on file  Social History Narrative  . Not on file   Social Determinants of Health   Financial Resource Strain: Low Risk   . Difficulty of Paying Living Expenses: Not hard at all  Food Insecurity: No Food Insecurity  . Worried About Charity fundraiser in the Last Year: Never true  . Ran Out of Food in the Last Year: Never true  Transportation Needs: No Transportation Needs  . Lack of Transportation (Medical): No  . Lack of Transportation (Non-Medical): No  Physical Activity:   . Days of Exercise per Week:   . Minutes of Exercise per Session:   Stress:   . Feeling of Stress :   Social Connections:   . Frequency of Communication with Friends and Family:   .  Frequency of Social Gatherings with Friends and Family:   . Attends Religious Services:   . Active Member of Clubs or Organizations:   . Attends Banker Meetings:   Marland Kitchen Marital Status:   Intimate Partner Violence: Not At Risk  . Fear of Current or Ex-Partner: No  . Emotionally Abused: No  . Physically Abused: No  . Sexually Abused: No    Family History  Problem Relation Age of Onset  . Healthy Mother   . Healthy Father   . Hypertension Maternal Grandmother     The following portions of the patient's history were reviewed and updated as appropriate: allergies, current medications, past family history, past medical history, past social history, past surgical history and problem list.  Review of Systems Pertinent items are noted in HPI.   Objective:  BP 114/72   Pulse  70   Wt 153 lb (69.4 kg)   Breastfeeding Yes   BMI 23.96 kg/m  Wt Readings from Last 3 Encounters:  11/20/19 153 lb (69.4 kg)  08/13/19 167 lb (75.8 kg)  01/23/19 176 lb (79.8 kg)     Chaperone present during exam  CONSTITUTIONAL: Well-developed, well-nourished female in no acute distress.  HENT:  Normocephalic, atraumatic, External right and left ear normal. Oropharynx is clear and moist EYES: Conjunctivae and EOM are normal. Pupils are equal, round, and reactive to light. No scleral icterus.  NECK: Normal range of motion, supple, no masses.  Normal thyroid.   CARDIOVASCULAR: Normal heart rate noted, regular rhythm RESPIRATORY: Clear to auscultation bilaterally. Effort and breath sounds normal, no problems with respiration noted. BREASTS: Symmetric in size. No masses, skin changes, nipple drainage, or lymphadenopathy. ABDOMEN: Soft, normal bowel sounds, no distention noted.  No tenderness, rebound or guarding.  PELVIC: Normal appearing external genitalia; normal appearing vaginal mucosa and cervix.  No abnormal discharge noted.  Normal uterine size, no other palpable masses, no uterine or adnexal tenderness. MUSCULOSKELETAL: Normal range of motion. No tenderness.  No cyanosis, clubbing, or edema.  2+ distal pulses. SKIN: Skin is warm and dry. No rash noted. Not diaphoretic. No erythema. No pallor. NEUROLOGIC: Alert and oriented to person, place, and time. Normal reflexes, muscle tone coordination. No cranial nerve deficit noted. PSYCHIATRIC: Normal mood and affect. Normal behavior. Normal judgment and thought content.  Assessment:  Annual gynecologic examination with pap smear   Plan:  1. Well Woman Exam Will follow up results of pap smear and manage accordingly. STD testing discussed. Patient requested vaginal testing    Routine preventative health maintenance measures emphasized. Please refer to After Visit Summary for other counseling recommendations.    Candelaria Celeste,  DO [facility]

## 2019-11-23 LAB — CYTOLOGY - PAP
Chlamydia: NEGATIVE
Comment: NEGATIVE
Comment: NORMAL
Diagnosis: NEGATIVE
Neisseria Gonorrhea: NEGATIVE

## 2020-01-13 ENCOUNTER — Other Ambulatory Visit: Payer: Self-pay

## 2020-01-13 ENCOUNTER — Ambulatory Visit (INDEPENDENT_AMBULATORY_CARE_PROVIDER_SITE_OTHER): Payer: Medicaid Other

## 2020-01-13 VITALS — BP 107/55 | HR 90 | Wt 153.0 lb

## 2020-01-13 DIAGNOSIS — R300 Dysuria: Secondary | ICD-10-CM | POA: Diagnosis not present

## 2020-01-13 DIAGNOSIS — R399 Unspecified symptoms and signs involving the genitourinary system: Secondary | ICD-10-CM

## 2020-01-13 LAB — POCT URINALYSIS DIPSTICK OB
Blood, UA: NEGATIVE
Glucose, UA: NEGATIVE
Nitrite, UA: NEGATIVE
Spec Grav, UA: 1.015 (ref 1.010–1.025)

## 2020-01-13 MED ORDER — CEPHALEXIN 500 MG PO CAPS: 500.0000 mg | ORAL_CAPSULE | Freq: Four times a day (QID) | ORAL | 0 refills | Status: DC

## 2020-01-13 NOTE — Progress Notes (Signed)
I have reviewed this chart and agree with the RN/CMA assessment and management.    K. Meryl Arlander Gillen, M.D. Attending [facility] (Faculty Practice)   

## 2020-01-13 NOTE — Progress Notes (Signed)
Pt presents with frequent urination, burning, and pain x 1 day. Pt states she is [redacted] weeks pregnant. Per protocol Keflex QID x 7 days was sent to pt's pharmacy. Urine culture was sent to the lab. Sharbel Sahagun l Baylie Drakes, CMA

## 2020-01-15 LAB — URINE CULTURE, OB REFLEX

## 2020-01-15 LAB — CULTURE, OB URINE

## 2020-01-26 ENCOUNTER — Other Ambulatory Visit: Payer: Self-pay

## 2020-01-26 ENCOUNTER — Encounter: Payer: Self-pay | Admitting: Advanced Practice Midwife

## 2020-01-26 ENCOUNTER — Other Ambulatory Visit (HOSPITAL_COMMUNITY)
Admission: RE | Admit: 2020-01-26 | Discharge: 2020-01-26 | Disposition: A | Discharge status: Home / Self Care | Payer: BC Managed Care – PPO | Source: Ambulatory Visit | Attending: Advanced Practice Midwife | Admitting: Advanced Practice Midwife

## 2020-01-26 ENCOUNTER — Ambulatory Visit (INDEPENDENT_AMBULATORY_CARE_PROVIDER_SITE_OTHER): Payer: BC Managed Care – PPO | Admitting: Advanced Practice Midwife

## 2020-01-26 VITALS — BP 122/84 | HR 92 | Wt 151.0 lb

## 2020-01-26 DIAGNOSIS — B373 Candidiasis of vulva and vagina: Secondary | ICD-10-CM

## 2020-01-26 DIAGNOSIS — Z23 Encounter for immunization: Secondary | ICD-10-CM | POA: Insufficient documentation

## 2020-01-26 DIAGNOSIS — B3731 Acute candidiasis of vulva and vagina: Secondary | ICD-10-CM

## 2020-01-26 DIAGNOSIS — N898 Other specified noninflammatory disorders of vagina: Secondary | ICD-10-CM

## 2020-01-26 MED ORDER — TERCONAZOLE 0.4 % VA CREA: 1.0000 | TOPICAL_CREAM | Freq: Every day | VAGINAL | 0 refills | Status: DC

## 2020-01-26 MED ORDER — FLUCONAZOLE 150 MG PO TABS: 150.0000 mg | ORAL_TABLET | Freq: Once | ORAL | 0 refills | Status: AC

## 2020-01-26 NOTE — Progress Notes (Addendum)
GYNECOLOGY CARE ENCOUNTER NOTE  Subjective:   Lindsay Phillips is a 24 y.o. G59P1011 female here for vaginal itching and irritation, "feels like cuts when I urinate".   Denies abnormal vaginal bleeding, pelvic pain, problems with intercourse or other gynecologic concerns.   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 Contraception: none Last Pap: 11/20/19. Results were: normal  Obstetric History OB History  Gravida Para Term Preterm AB Living  2 1 1   1 1   SAB TAB Ectopic Multiple Live Births    1   0 1    # Outcome Date GA Lbr Len/2nd Weight Sex Delivery Anes PTL Lv  2 Term 12/03/18 [redacted]w[redacted]d 06:48 / 00:27 6 lb 8.8 oz (2.97 kg) M Vag-Spont None  LIV  1 TAB 06/2016            Past Medical History:  Diagnosis Date  . Medical history non-contributory     Past Surgical History:  Procedure Laterality Date  . INDUCED ABORTION      Current Outpatient Medications on File Prior to Visit  Medication Sig Dispense Refill  . Prenatal Vit-Fe Fumarate-FA (PRENATAL VITAMINS PO) Take by mouth.    . cephALEXin (KEFLEX) 500 MG capsule Take 1 capsule (500 mg total) by mouth 4 (four) times daily. (Patient not taking: Reported on 01/26/2020) 28 capsule 0  . cholecalciferol (VITAMIN D3) 25 MCG (1000 UT) tablet Take 1,000 Units by mouth daily.    . clobetasol cream (TEMOVATE) 0.05 % Apply 1 application topically 2 (two) times daily as needed.    . ferrous sulfate 325 (65 FE) MG tablet TAKE 1 TABLET BY MOUTH TWICE A DAY (Patient not taking: Reported on 01/23/2019) 180 tablet 1  . ibuprofen (ADVIL) 600 MG tablet Take 1 tablet (600 mg total) by mouth every 6 (six) hours. (Patient not taking: Reported on 01/23/2019) 30 tablet 0  . norethindrone (ORTHO MICRONOR) 0.35 MG tablet Take 1 tablet (0.35 mg total) by mouth daily. 3 Package 3  . Omega-3 Fatty Acids (FISH OIL) 1000 MG CAPS Take 2,000 mg by mouth daily.    . Prenatal Vit-Fe Fumarate-FA (PRENATAL MULTIVITAMIN)  TABS tablet Take 1 tablet by mouth daily at 12 noon.     No current facility-administered medications on file prior to visit.    No Known Allergies  Social History   Socioeconomic History  . Marital status: Married    Spouse name: Not on file  . Number of children: 1  . Years of education: Not on file  . Highest education level: Not on file  Occupational History  . Not on file  Tobacco Use  . Smoking status: Never Smoker  . Smokeless tobacco: Never Used  Vaping Use  . Vaping Use: Never used  Substance and Sexual Activity  . Alcohol use: Not Currently    Comment: not in pregnancy  . Drug use: Never  . Sexual activity: Yes    Birth control/protection: None  Other Topics Concern  . Not on file  Social History Narrative  . Not on file   Social Determinants of Health   Financial Resource Strain:   . Difficulty of Paying Living Expenses: Not on file  Food Insecurity:   . Worried About 01/25/2019 in the Last Year: Not on file  . Ran Out of Food in the Last Year: Not on file  Transportation Needs:   . Lack of Transportation (Medical): Not on file  . Lack of  Transportation (Non-Medical): Not on file  Physical Activity:   . Days of Exercise per Week: Not on file  . Minutes of Exercise per Session: Not on file  Stress:   . Feeling of Stress : Not on file  Social Connections:   . Frequency of Communication with Friends and Family: Not on file  . Frequency of Social Gatherings with Friends and Family: Not on file  . Attends Religious Services: Not on file  . Active Member of Clubs or Organizations: Not on file  . Attends Banker Meetings: Not on file  . Marital Status: Not on file  Intimate Partner Violence:   . Fear of Current or Ex-Partner: Not on file  . Emotionally Abused: Not on file  . Physically Abused: Not on file  . Sexually Abused: Not on file    Family History  Problem Relation Age of Onset  . Healthy Mother   . Healthy Father   .  Hypertension Maternal Grandmother     The following portions of the patient's history were reviewed and updated as appropriate: allergies, current medications, past family history, past medical history, past social history, past surgical history and problem list.  Review of Systems Pertinent items noted in HPI and remainder of comprehensive ROS otherwise negative.   Objective:  BP 122/84   Pulse 92   Wt 151 lb (68.5 kg)   BMI 23.65 kg/m  CONSTITUTIONAL: Well-developed, well-nourished female in no acute distress.  SKIN: Skin is warm and dry. No rash noted. Not diaphoretic. No erythema. No pallor. CARDIOVASCULAR: Normal heart rate noted, regular rhythm RESPIRATORY: Effort normal, no problems with respiration noted. ABDOMEN: Soft, no distention noted.  No tenderness  PELVIC: Normal appearing external genitalia; normal appearing vaginal mucosa and cervix.  No abnormal discharge noted.  Mild thin white discharge, No lesions noted.  .   Assessment:  Vaginitis Likely yeast vaginitis given recent antibiotics [redacted] weeks pregnant, incidental   Plan:  Will follow up results of testing and manage accordingly. Rx Diflucan with Terazol cream for external use for probable yeast vaginitis  Will schedule New OB, precautions reviewedd. Please refer to After Visit Summary for other counseling recommendations.   Aviva Signs, CNM

## 2020-01-26 NOTE — Patient Instructions (Signed)
First Trimester of Pregnancy The first trimester of pregnancy is from week 1 until the end of week 13 (months 1 through 3). A week after a sperm fertilizes an egg, the egg will implant on the wall of the uterus. This embryo will begin to develop into a baby. Genes from you and your partner will form the baby. The female genes will determine whether the baby will be a boy or a girl. At 6-8 weeks, the eyes and face will be formed, and the heartbeat can be seen on ultrasound. At the end of 12 weeks, all the baby's organs will be formed. Now that you are pregnant, you will want to do everything you can to have a healthy baby. Two of the most important things are to get good prenatal care and to follow your health care provider's instructions. Prenatal care is all the medical care you receive before the baby's birth. This care will help prevent, find, and treat any problems during the pregnancy and childbirth. Body changes during your first trimester Your body goes through many changes during pregnancy. The changes vary from woman to woman.  You may gain or lose a couple of pounds at first.  You may feel sick to your stomach (nauseous) and you may throw up (vomit). If the vomiting is uncontrollable, call your health care provider.  You may tire easily.  You may develop headaches that can be relieved by medicines. All medicines should be approved by your health care provider.  You may urinate more often. Painful urination may mean you have a bladder infection.  You may develop heartburn as a result of your pregnancy.  You may develop constipation because certain hormones are causing the muscles that push stool through your intestines to slow down.  You may develop hemorrhoids or swollen veins (varicose veins).  Your breasts may begin to grow larger and become tender. Your nipples may stick out more, and the tissue that surrounds them (areola) may become darker.  Your gums may bleed and may be  sensitive to brushing and flossing.  Dark spots or blotches (chloasma, mask of pregnancy) may develop on your face. This will likely fade after the baby is born.  Your menstrual periods will stop.  You may have a loss of appetite.  You may develop cravings for certain kinds of food.  You may have changes in your emotions from day to day, such as being excited to be pregnant or being concerned that something may go wrong with the pregnancy and baby.  You may have more vivid and strange dreams.  You may have changes in your hair. These can include thickening of your hair, rapid growth, and changes in texture. Some women also have hair loss during or after pregnancy, or hair that feels dry or thin. Your hair will most likely return to normal after your baby is born. What to expect at prenatal visits During a routine prenatal visit:  You will be weighed to make sure you and the baby are growing normally.  Your blood pressure will be taken.  Your abdomen will be measured to track your baby's growth.  The fetal heartbeat will be listened to between weeks 10 and 14 of your pregnancy.  Test results from any previous visits will be discussed. Your health care provider may ask you:  How you are feeling.  If you are feeling the baby move.  If you have had any abnormal symptoms, such as leaking fluid, bleeding, severe headaches, or abdominal   cramping.  If you are using any tobacco products, including cigarettes, chewing tobacco, and electronic cigarettes.  If you have any questions. Other tests that may be performed during your first trimester include:  Blood tests to find your blood type and to check for the presence of any previous infections. The tests will also be used to check for low iron levels (anemia) and protein on red blood cells (Rh antibodies). Depending on your risk factors, or if you previously had diabetes during pregnancy, you may have tests to check for high blood sugar  that affects pregnant women (gestational diabetes).  Urine tests to check for infections, diabetes, or protein in the urine.  An ultrasound to confirm the proper growth and development of the baby.  Fetal screens for spinal cord problems (spina bifida) and Down syndrome.  HIV (human immunodeficiency virus) testing. Routine prenatal testing includes screening for HIV, unless you choose not to have this test.  You may need other tests to make sure you and the baby are doing well. Follow these instructions at home: Medicines  Follow your health care provider's instructions regarding medicine use. Specific medicines may be either safe or unsafe to take during pregnancy.  Take a prenatal vitamin that contains at least 600 micrograms (mcg) of folic acid.  If you develop constipation, try taking a stool softener if your health care provider approves. Eating and drinking   Eat a balanced diet that includes fresh fruits and vegetables, whole grains, good sources of protein such as meat, eggs, or tofu, and low-fat dairy. Your health care provider will help you determine the amount of weight gain that is right for you.  Avoid raw meat and uncooked cheese. These carry germs that can cause birth defects in the baby.  Eating four or five small meals rather than three large meals a day may help relieve nausea and vomiting. If you start to feel nauseous, eating a few soda crackers can be helpful. Drinking liquids between meals, instead of during meals, also seems to help ease nausea and vomiting.  Limit foods that are high in fat and processed sugars, such as fried and sweet foods.  To prevent constipation: ? Eat foods that are high in fiber, such as fresh fruits and vegetables, whole grains, and beans. ? Drink enough fluid to keep your urine clear or pale yellow. Activity  Exercise only as directed by your health care provider. Most women can continue their usual exercise routine during  pregnancy. Try to exercise for 30 minutes at least 5 days a week. Exercising will help you: ? Control your weight. ? Stay in shape. ? Be prepared for labor and delivery.  Experiencing pain or cramping in the lower abdomen or lower back is a good sign that you should stop exercising. Check with your health care provider before continuing with normal exercises.  Try to avoid standing for long periods of time. Move your legs often if you must stand in one place for a long time.  Avoid heavy lifting.  Wear low-heeled shoes and practice good posture.  You may continue to have sex unless your health care provider tells you not to. Relieving pain and discomfort  Wear a good support bra to relieve breast tenderness.  Take warm sitz baths to soothe any pain or discomfort caused by hemorrhoids. Use hemorrhoid cream if your health care provider approves.  Rest with your legs elevated if you have leg cramps or low back pain.  If you develop varicose veins in   your legs, wear support hose. Elevate your feet for 15 minutes, 3-4 times a day. Limit salt in your diet. Prenatal care  Schedule your prenatal visits by the twelfth week of pregnancy. They are usually scheduled monthly at first, then more often in the last 2 months before delivery.  Write down your questions. Take them to your prenatal visits.  Keep all your prenatal visits as told by your health care provider. This is important. Safety  Wear your seat belt at all times when driving.  Make a list of emergency phone numbers, including numbers for family, friends, the hospital, and police and fire departments. General instructions  Ask your health care provider for a referral to a local prenatal education class. Begin classes no later than the beginning of month 6 of your pregnancy.  Ask for help if you have counseling or nutritional needs during pregnancy. Your health care provider can offer advice or refer you to specialists for help  with various needs.  Do not use hot tubs, steam rooms, or saunas.  Do not douche or use tampons or scented sanitary pads.  Do not cross your legs for long periods of time.  Avoid cat litter boxes and soil used by cats. These carry germs that can cause birth defects in the baby and possibly loss of the fetus by miscarriage or stillbirth.  Avoid all smoking, herbs, alcohol, and medicines not prescribed by your health care provider. Chemicals in these products affect the formation and growth of the baby.  Do not use any products that contain nicotine or tobacco, such as cigarettes and e-cigarettes. If you need help quitting, ask your health care provider. You may receive counseling support and other resources to help you quit.  Schedule a dentist appointment. At home, brush your teeth with a soft toothbrush and be gentle when you floss. Contact a health care provider if:  You have dizziness.  You have mild pelvic cramps, pelvic pressure, or nagging pain in the abdominal area.  You have persistent nausea, vomiting, or diarrhea.  You have a bad smelling vaginal discharge.  You have pain when you urinate.  You notice increased swelling in your face, hands, legs, or ankles.  You are exposed to fifth disease or chickenpox.  You are exposed to German measles (rubella) and have never had it. Get help right away if:  You have a fever.  You are leaking fluid from your vagina.  You have spotting or bleeding from your vagina.  You have severe abdominal cramping or pain.  You have rapid weight gain or loss.  You vomit blood or material that looks like coffee grounds.  You develop a severe headache.  You have shortness of breath.  You have any kind of trauma, such as from a fall or a car accident. Summary  The first trimester of pregnancy is from week 1 until the end of week 13 (months 1 through 3).  Your body goes through many changes during pregnancy. The changes vary from  woman to woman.  You will have routine prenatal visits. During those visits, your health care provider will examine you, discuss any test results you may have, and talk with you about how you are feeling. This information is not intended to replace advice given to you by your health care provider. Make sure you discuss any questions you have with your health care provider. Document Revised: 04/26/2017 Document Reviewed: 04/25/2016 [employer] Patient Education  2020 [employer]  

## 2020-01-27 LAB — CERVICOVAGINAL ANCILLARY ONLY
Candida Glabrata: NEGATIVE
Candida Vaginitis: POSITIVE — AB
Comment: NEGATIVE
Comment: NEGATIVE

## 2020-02-03 ENCOUNTER — Telehealth: Payer: Self-pay | Admitting: *Deleted

## 2020-02-03 MED ORDER — DOXYLAMINE-PYRIDOXINE 10-10 MG PO TBEC: 1.0000 | DELAYED_RELEASE_TABLET | Freq: Two times a day (BID) | ORAL | 6 refills | Status: DC

## 2020-02-03 NOTE — Telephone Encounter (Signed)
Pt called and is currently pregnant having her PNC in this office.  She is requesting Diclegis be called into her pharmacy for nausea.  RX sent to [facility] [address] [place].

## 2020-02-10 ENCOUNTER — Encounter: Payer: Medicaid Other | Admitting: Obstetrics and Gynecology

## 2020-02-10 ENCOUNTER — Encounter: Payer: Self-pay | Admitting: Obstetrics and Gynecology

## 2020-02-10 ENCOUNTER — Other Ambulatory Visit: Payer: Self-pay

## 2020-02-10 ENCOUNTER — Ambulatory Visit (INDEPENDENT_AMBULATORY_CARE_PROVIDER_SITE_OTHER): Payer: Medicaid Other | Admitting: Obstetrics and Gynecology

## 2020-02-10 ENCOUNTER — Other Ambulatory Visit (HOSPITAL_COMMUNITY)
Admission: RE | Admit: 2020-02-10 | Discharge: 2020-02-10 | Disposition: A | Discharge status: Home / Self Care | Payer: Medicaid Other | Source: Ambulatory Visit | Attending: Obstetrics and Gynecology | Admitting: Obstetrics and Gynecology

## 2020-02-10 VITALS — BP 116/79 | HR 66 | Wt 152.0 lb

## 2020-02-10 DIAGNOSIS — O09899 Supervision of other high risk pregnancies, unspecified trimester: Secondary | ICD-10-CM

## 2020-02-10 DIAGNOSIS — Z6791 Unspecified blood type, Rh negative: Secondary | ICD-10-CM

## 2020-02-10 DIAGNOSIS — O26899 Other specified pregnancy related conditions, unspecified trimester: Secondary | ICD-10-CM | POA: Insufficient documentation

## 2020-02-10 DIAGNOSIS — Z3A09 9 weeks gestation of pregnancy: Secondary | ICD-10-CM

## 2020-02-10 DIAGNOSIS — Z3480 Encounter for supervision of other normal pregnancy, unspecified trimester: Secondary | ICD-10-CM | POA: Diagnosis not present

## 2020-02-10 MED ORDER — PROMETHAZINE HCL 25 MG PO TABS: 25.0000 mg | ORAL_TABLET | Freq: Four times a day (QID) | ORAL | 2 refills | Status: DC | PRN

## 2020-02-10 NOTE — Patient Instructions (Signed)
 First Trimester of Pregnancy The first trimester of pregnancy is from week 1 until the end of week 13 (months 1 through 3). A week after a sperm fertilizes an egg, the egg will implant on the wall of the uterus. This embryo will begin to develop into a baby. Genes from you and your partner will form the baby. The female genes will determine whether the baby will be a boy or a girl. At 6-8 weeks, the eyes and face will be formed, and the heartbeat can be seen on ultrasound. At the end of 12 weeks, all the baby's organs will be formed. Now that you are pregnant, you will want to do everything you can to have a healthy baby. Two of the most important things are to get good prenatal care and to follow your health care provider's instructions. Prenatal care is all the medical care you receive before the baby's birth. This care will help prevent, find, and treat any problems during the pregnancy and childbirth. Body changes during your first trimester Your body goes through many changes during pregnancy. The changes vary from woman to woman.  You may gain or lose a couple of pounds at first.  You may feel sick to your stomach (nauseous) and you may throw up (vomit). If the vomiting is uncontrollable, call your health care provider.  You may tire easily.  You may develop headaches that can be relieved by medicines. All medicines should be approved by your health care provider.  You may urinate more often. Painful urination may mean you have a bladder infection.  You may develop heartburn as a result of your pregnancy.  You may develop constipation because certain hormones are causing the muscles that push stool through your intestines to slow down.  You may develop hemorrhoids or swollen veins (varicose veins).  Your breasts may begin to grow larger and become tender. Your nipples may stick out more, and the tissue that surrounds them (areola) may become darker.  Your gums may bleed and may be  sensitive to brushing and flossing.  Dark spots or blotches (chloasma, mask of pregnancy) may develop on your face. This will likely fade after the baby is born.  Your menstrual periods will stop.  You may have a loss of appetite.  You may develop cravings for certain kinds of food.  You may have changes in your emotions from day to day, such as being excited to be pregnant or being concerned that something may go wrong with the pregnancy and baby.  You may have more vivid and strange dreams.  You may have changes in your hair. These can include thickening of your hair, rapid growth, and changes in texture. Some women also have hair loss during or after pregnancy, or hair that feels dry or thin. Your hair will most likely return to normal after your baby is born. What to expect at prenatal visits During a routine prenatal visit:  You will be weighed to make sure you and the baby are growing normally.  Your blood pressure will be taken.  Your abdomen will be measured to track your baby's growth.  The fetal heartbeat will be listened to between weeks 10 and 14 of your pregnancy.  Test results from any previous visits will be discussed. Your health care provider may ask you:  How you are feeling.  If you are feeling the baby move.  If you have had any abnormal symptoms, such as leaking fluid, bleeding, severe headaches, or   abdominal cramping.  If you are using any tobacco products, including cigarettes, chewing tobacco, and electronic cigarettes.  If you have any questions. Other tests that may be performed during your first trimester include:  Blood tests to find your blood type and to check for the presence of any previous infections. The tests will also be used to check for low iron levels (anemia) and protein on red blood cells (Rh antibodies). Depending on your risk factors, or if you previously had diabetes during pregnancy, you may have tests to check for high blood sugar  that affects pregnant women (gestational diabetes).  Urine tests to check for infections, diabetes, or protein in the urine.  An ultrasound to confirm the proper growth and development of the baby.  Fetal screens for spinal cord problems (spina bifida) and Down syndrome.  HIV (human immunodeficiency virus) testing. Routine prenatal testing includes screening for HIV, unless you choose not to have this test.  You may need other tests to make sure you and the baby are doing well. Follow these instructions at home: Medicines  Follow your health care provider's instructions regarding medicine use. Specific medicines may be either safe or unsafe to take during pregnancy.  Take a prenatal vitamin that contains at least 600 micrograms (mcg) of folic acid.  If you develop constipation, try taking a stool softener if your health care provider approves. Eating and drinking   Eat a balanced diet that includes fresh fruits and vegetables, whole grains, good sources of protein such as meat, eggs, or tofu, and low-fat dairy. Your health care provider will help you determine the amount of weight gain that is right for you.  Avoid raw meat and uncooked cheese. These carry germs that can cause birth defects in the baby.  Eating four or five small meals rather than three large meals a day may help relieve nausea and vomiting. If you start to feel nauseous, eating a few soda crackers can be helpful. Drinking liquids between meals, instead of during meals, also seems to help ease nausea and vomiting.  Limit foods that are high in fat and processed sugars, such as fried and sweet foods.  To prevent constipation: ? Eat foods that are high in fiber, such as fresh fruits and vegetables, whole grains, and beans. ? Drink enough fluid to keep your urine clear or pale yellow. Activity  Exercise only as directed by your health care provider. Most women can continue their usual exercise routine during  pregnancy. Try to exercise for 30 minutes at least 5 days a week. Exercising will help you: ? Control your weight. ? Stay in shape. ? Be prepared for labor and delivery.  Experiencing pain or cramping in the lower abdomen or lower back is a good sign that you should stop exercising. Check with your health care provider before continuing with normal exercises.  Try to avoid standing for long periods of time. Move your legs often if you must stand in one place for a long time.  Avoid heavy lifting.  Wear low-heeled shoes and practice good posture.  You may continue to have sex unless your health care provider tells you not to. Relieving pain and discomfort  Wear a good support bra to relieve breast tenderness.  Take warm sitz baths to soothe any pain or discomfort caused by hemorrhoids. Use hemorrhoid cream if your health care provider approves.  Rest with your legs elevated if you have leg cramps or low back pain.  If you develop varicose veins   in your legs, wear support hose. Elevate your feet for 15 minutes, 3-4 times a day. Limit salt in your diet. Prenatal care  Schedule your prenatal visits by the twelfth week of pregnancy. They are usually scheduled monthly at first, then more often in the last 2 months before delivery.  Write down your questions. Take them to your prenatal visits.  Keep all your prenatal visits as told by your health care provider. This is important. Safety  Wear your seat belt at all times when driving.  Make a list of emergency phone numbers, including numbers for family, friends, the hospital, and police and fire departments. General instructions  Ask your health care provider for a referral to a local prenatal education class. Begin classes no later than the beginning of month 6 of your pregnancy.  Ask for help if you have counseling or nutritional needs during pregnancy. Your health care provider can offer advice or refer you to specialists for help  with various needs.  Do not use hot tubs, steam rooms, or saunas.  Do not douche or use tampons or scented sanitary pads.  Do not cross your legs for long periods of time.  Avoid cat litter boxes and soil used by cats. These carry germs that can cause birth defects in the baby and possibly loss of the fetus by miscarriage or stillbirth.  Avoid all smoking, herbs, alcohol, and medicines not prescribed by your health care provider. Chemicals in these products affect the formation and growth of the baby.  Do not use any products that contain nicotine or tobacco, such as cigarettes and e-cigarettes. If you need help quitting, ask your health care provider. You may receive counseling support and other resources to help you quit.  Schedule a dentist appointment. At home, brush your teeth with a soft toothbrush and be gentle when you floss. Contact a health care provider if:  You have dizziness.  You have mild pelvic cramps, pelvic pressure, or nagging pain in the abdominal area.  You have persistent nausea, vomiting, or diarrhea.  You have a bad smelling vaginal discharge.  You have pain when you urinate.  You notice increased swelling in your face, hands, legs, or ankles.  You are exposed to fifth disease or chickenpox.  You are exposed to German measles (rubella) and have never had it. Get help right away if:  You have a fever.  You are leaking fluid from your vagina.  You have spotting or bleeding from your vagina.  You have severe abdominal cramping or pain.  You have rapid weight gain or loss.  You vomit blood or material that looks like coffee grounds.  You develop a severe headache.  You have shortness of breath.  You have any kind of trauma, such as from a fall or a car accident. Summary  The first trimester of pregnancy is from week 1 until the end of week 13 (months 1 through 3).  Your body goes through many changes during pregnancy. The changes vary from  woman to woman.  You will have routine prenatal visits. During those visits, your health care provider will examine you, discuss any test results you may have, and talk with you about how you are feeling. This information is not intended to replace advice given to you by your health care provider. Make sure you discuss any questions you have with your health care provider. Document Revised: 04/26/2017 Document Reviewed: 04/25/2016 [employer] Patient Education  2020 [employer]   Second Trimester of   Pregnancy The second trimester is from week 14 through week 27 (months 4 through 6). The second trimester is often a time when you feel your best. Your body has adjusted to being pregnant, and you begin to feel better physically. Usually, morning sickness has lessened or quit completely, you may have more energy, and you may have an increase in appetite. The second trimester is also a time when the fetus is growing rapidly. At the end of the sixth month, the fetus is about 9 inches long and weighs about 1 pounds. You will likely begin to feel the baby move (quickening) between 16 and 20 weeks of pregnancy. Body changes during your second trimester Your body continues to go through many changes during your second trimester. The changes vary from woman to woman.  Your weight will continue to increase. You will notice your lower abdomen bulging out.  You may begin to get stretch marks on your hips, abdomen, and breasts.  You may develop headaches that can be relieved by medicines. The medicines should be approved by your health care provider.  You may urinate more often because the fetus is pressing on your bladder.  You may develop or continue to have heartburn as a result of your pregnancy.  You may develop constipation because certain hormones are causing the muscles that push waste through your intestines to slow down.  You may develop hemorrhoids or swollen, bulging veins (varicose  veins).  You may have back pain. This is caused by: ? Weight gain. ? Pregnancy hormones that are relaxing the joints in your pelvis. ? A shift in weight and the muscles that support your balance.  Your breasts will continue to grow and they will continue to become tender.  Your gums may bleed and may be sensitive to brushing and flossing.  Dark spots or blotches (chloasma, mask of pregnancy) may develop on your face. This will likely fade after the baby is born.  A dark line from your belly button to the pubic area (linea nigra) may appear. This will likely fade after the baby is born.  You may have changes in your hair. These can include thickening of your hair, rapid growth, and changes in texture. Some women also have hair loss during or after pregnancy, or hair that feels dry or thin. Your hair will most likely return to normal after your baby is born. What to expect at prenatal visits During a routine prenatal visit:  You will be weighed to make sure you and the fetus are growing normally.  Your blood pressure will be taken.  Your abdomen will be measured to track your baby's growth.  The fetal heartbeat will be listened to.  Any test results from the previous visit will be discussed. Your health care provider may ask you:  How you are feeling.  If you are feeling the baby move.  If you have had any abnormal symptoms, such as leaking fluid, bleeding, severe headaches, or abdominal cramping.  If you are using any tobacco products, including cigarettes, chewing tobacco, and electronic cigarettes.  If you have any questions. Other tests that may be performed during your second trimester include:  Blood tests that check for: ? Low iron levels (anemia). ? High blood sugar that affects pregnant women (gestational diabetes) between 24 and 28 weeks. ? Rh antibodies. This is to check for a protein on red blood cells (Rh factor).  Urine tests to check for infections,  diabetes, or protein in the urine.    An ultrasound to confirm the proper growth and development of the baby.  An amniocentesis to check for possible genetic problems.  Fetal screens for spina bifida and Down syndrome.  HIV (human immunodeficiency virus) testing. Routine prenatal testing includes screening for HIV, unless you choose not to have this test. Follow these instructions at home: Medicines  Follow your health care provider's instructions regarding medicine use. Specific medicines may be either safe or unsafe to take during pregnancy.  Take a prenatal vitamin that contains at least 600 micrograms (mcg) of folic acid.  If you develop constipation, try taking a stool softener if your health care provider approves. Eating and drinking   Eat a balanced diet that includes fresh fruits and vegetables, whole grains, good sources of protein such as meat, eggs, or tofu, and low-fat dairy. Your health care provider will help you determine the amount of weight gain that is right for you.  Avoid raw meat and uncooked cheese. These carry germs that can cause birth defects in the baby.  If you have low calcium intake from food, talk to your health care provider about whether you should take a daily calcium supplement.  Limit foods that are high in fat and processed sugars, such as fried and sweet foods.  To prevent constipation: ? Drink enough fluid to keep your urine clear or pale yellow. ? Eat foods that are high in fiber, such as fresh fruits and vegetables, whole grains, and beans. Activity  Exercise only as directed by your health care provider. Most women can continue their usual exercise routine during pregnancy. Try to exercise for 30 minutes at least 5 days a week. Stop exercising if you experience uterine contractions.  Avoid heavy lifting, wear low heel shoes, and practice good posture.  A sexual relationship may be continued unless your health care provider directs you  otherwise. Relieving pain and discomfort  Wear a good support bra to prevent discomfort from breast tenderness.  Take warm sitz baths to soothe any pain or discomfort caused by hemorrhoids. Use hemorrhoid cream if your health care provider approves.  Rest with your legs elevated if you have leg cramps or low back pain.  If you develop varicose veins, wear support hose. Elevate your feet for 15 minutes, 3-4 times a day. Limit salt in your diet. Prenatal Care  Write down your questions. Take them to your prenatal visits.  Keep all your prenatal visits as told by your health care provider. This is important. Safety  Wear your seat belt at all times when driving.  Make a list of emergency phone numbers, including numbers for family, friends, the hospital, and police and fire departments. General instructions  Ask your health care provider for a referral to a local prenatal education class. Begin classes no later than the beginning of month 6 of your pregnancy.  Ask for help if you have counseling or nutritional needs during pregnancy. Your health care provider can offer advice or refer you to specialists for help with various needs.  Do not use hot tubs, steam rooms, or saunas.  Do not douche or use tampons or scented sanitary pads.  Do not cross your legs for long periods of time.  Avoid cat litter boxes and soil used by cats. These carry germs that can cause birth defects in the baby and possibly loss of the fetus by miscarriage or stillbirth.  Avoid all smoking, herbs, alcohol, and unprescribed drugs. Chemicals in these products can affect the formation and growth of   the baby.  Do not use any products that contain nicotine or tobacco, such as cigarettes and e-cigarettes. If you need help quitting, ask your health care provider.  Visit your dentist if you have not gone yet during your pregnancy. Use a soft toothbrush to brush your teeth and be gentle when you floss. Contact a  health care provider if:  You have dizziness.  You have mild pelvic cramps, pelvic pressure, or nagging pain in the abdominal area.  You have persistent nausea, vomiting, or diarrhea.  You have a bad smelling vaginal discharge.  You have pain when you urinate. Get help right away if:  You have a fever.  You are leaking fluid from your vagina.  You have spotting or bleeding from your vagina.  You have severe abdominal cramping or pain.  You have rapid weight gain or weight loss.  You have shortness of breath with chest pain.  You notice sudden or extreme swelling of your face, hands, ankles, feet, or legs.  You have not felt your baby move in over an hour.  You have severe headaches that do not go away when you take medicine.  You have vision changes. Summary  The second trimester is from week 14 through week 27 (months 4 through 6). It is also a time when the fetus is growing rapidly.  Your body goes through many changes during pregnancy. The changes vary from woman to woman.  Avoid all smoking, herbs, alcohol, and unprescribed drugs. These chemicals affect the formation and growth your baby.  Do not use any tobacco products, such as cigarettes, chewing tobacco, and e-cigarettes. If you need help quitting, ask your health care provider.  Contact your health care provider if you have any questions. Keep all prenatal visits as told by your health care provider. This is important. This information is not intended to replace advice given to you by your health care provider. Make sure you discuss any questions you have with your health care provider. Document Revised: 09/05/2018 Document Reviewed: 06/19/2016 [employer] Patient Education  2020 [employer]   Contraception Choices Contraception, also called birth control, refers to methods or devices that prevent pregnancy. Hormonal methods Contraceptive implant  A contraceptive implant is a thin, plastic tube that  contains a hormone. It is inserted into the upper part of the arm. It can remain in place for up to 3 years. Progestin-only injections Progestin-only injections are injections of progestin, a synthetic form of the hormone progesterone. They are given every 3 months by a health care provider. Birth control pills  Birth control pills are pills that contain hormones that prevent pregnancy. They must be taken once a day, preferably at the same time each day. Birth control patch  The birth control patch contains hormones that prevent pregnancy. It is placed on the skin and must be changed once a week for three weeks and removed on the fourth week. A prescription is needed to use this method of contraception. Vaginal ring  A vaginal ring contains hormones that prevent pregnancy. It is placed in the vagina for three weeks and removed on the fourth week. After that, the process is repeated with a new ring. A prescription is needed to use this method of contraception. Emergency contraceptive Emergency contraceptives prevent pregnancy after unprotected sex. They come in pill form and can be taken up to 5 days after sex. They work best the sooner they are taken after having sex. Most emergency contraceptives are available without a prescription. This   method should not be used as your only form of birth control. Barrier methods Female condom  A female condom is a thin sheath that is worn over the penis during sex. Condoms keep sperm from going inside a woman's body. They can be used with a spermicide to increase their effectiveness. They should be disposed after a single use. Female condom  A female condom is a soft, loose-fitting sheath that is put into the vagina before sex. The condom keeps sperm from going inside a woman's body. They should be disposed after a single use. Diaphragm  A diaphragm is a soft, dome-shaped barrier. It is inserted into the vagina before sex, along with a spermicide. The  diaphragm blocks sperm from entering the uterus, and the spermicide kills sperm. A diaphragm should be left in the vagina for 6-8 hours after sex and removed within 24 hours. A diaphragm is prescribed and fitted by a health care provider. A diaphragm should be replaced every 1-2 years, after giving birth, after gaining more than 15 lb (6.8 kg), and after pelvic surgery. Cervical cap  A cervical cap is a round, soft latex or plastic cup that fits over the cervix. It is inserted into the vagina before sex, along with spermicide. It blocks sperm from entering the uterus. The cap should be left in place for 6-8 hours after sex and removed within 48 hours. A cervical cap must be prescribed and fitted by a health care provider. It should be replaced every 2 years. Sponge  A sponge is a soft, circular piece of polyurethane foam with spermicide on it. The sponge helps block sperm from entering the uterus, and the spermicide kills sperm. To use it, you make it wet and then insert it into the vagina. It should be inserted before sex, left in for at least 6 hours after sex, and removed and thrown away within 30 hours. Spermicides Spermicides are chemicals that kill or block sperm from entering the cervix and uterus. They can come as a cream, jelly, suppository, foam, or tablet. A spermicide should be inserted into the vagina with an applicator at least 10-15 minutes before sex to allow time for it to work. The process must be repeated every time you have sex. Spermicides do not require a prescription. Intrauterine contraception Intrauterine device (IUD) An IUD is a T-shaped device that is put in a woman's uterus. There are two types:  Hormone IUD.This type contains progestin, a synthetic form of the hormone progesterone. This type can stay in place for 3-5 years.  Copper IUD.This type is wrapped in copper wire. It can stay in place for 10 years.  Permanent methods of contraception Female tubal ligation In  this method, a woman's fallopian tubes are sealed, tied, or blocked during surgery to prevent eggs from traveling to the uterus. Hysteroscopic sterilization In this method, a small, flexible insert is placed into each fallopian tube. The inserts cause scar tissue to form in the fallopian tubes and block them, so sperm cannot reach an egg. The procedure takes about 3 months to be effective. Another form of birth control must be used during those 3 months. Female sterilization This is a procedure to tie off the tubes that carry sperm (vasectomy). After the procedure, the man can still ejaculate fluid (semen). Natural planning methods Natural family planning In this method, a couple does not have sex on days when the woman could become pregnant. Calendar method This means keeping track of the length of each menstrual cycle,   identifying the days when pregnancy can happen, and not having sex on those days. Ovulation method In this method, a couple avoids sex during ovulation. Symptothermal method This method involves not having sex during ovulation. The woman typically checks for ovulation by watching changes in her temperature and in the consistency of cervical mucus. Post-ovulation method In this method, a couple waits to have sex until after ovulation. Summary  Contraception, also called birth control, means methods or devices that prevent pregnancy.  Hormonal methods of contraception include implants, injections, pills, patches, vaginal rings, and emergency contraceptives.  Barrier methods of contraception can include female condoms, female condoms, diaphragms, cervical caps, sponges, and spermicides.  There are two types of IUDs (intrauterine devices). An IUD can be put in a woman's uterus to prevent pregnancy for 3-5 years.  Permanent sterilization can be done through a procedure for males, females, or both.  Natural family planning methods involve not having sex on days when the woman could  become pregnant. This information is not intended to replace advice given to you by your health care provider. Make sure you discuss any questions you have with your health care provider. Document Revised: 05/16/2017 Document Reviewed: 06/16/2016 Elsevier Patient Education  2020 [employer] Inc[employer]   Breastfeeding  Choosing to breastfeed is one of the best decisions you can make for yourself and your baby. A change in hormones during pregnancy causes your breasts to make breast milk in your milk-producing glands. Hormones prevent breast milk from being released before your baby is born. They also prompt milk flow after birth. Once breastfeeding has begun, thoughts of your baby, as well as his or her sucking or crying, can stimulate the release of milk from your milk-producing glands. Benefits of breastfeeding Research shows that breastfeeding offers many health benefits for infants and mothers. It also offers a cost-free and convenient way to feed your baby. For your baby  Your first milk (colostrum) helps your baby's digestive system to function better.  Special cells in your milk (antibodies) help your baby to fight off infections.  Breastfed babies are less likely to develop asthma, allergies, obesity, or type 2 diabetes. They are also at lower risk for sudden infant death syndrome (SIDS).  Nutrients in breast milk are better able to meet your baby's needs compared to infant formula.  Breast milk improves your baby's brain development. For you  Breastfeeding helps to create a very special bond between you and your baby.  Breastfeeding is convenient. Breast milk costs nothing and is always available at the correct temperature.  Breastfeeding helps to burn calories. It helps you to lose the weight that you gained during pregnancy.  Breastfeeding makes your uterus return faster to its size before pregnancy. It also slows bleeding (lochia) after you give birth.  Breastfeeding helps to lower  your risk of developing type 2 diabetes, osteoporosis, rheumatoid arthritis, cardiovascular disease, and breast, ovarian, uterine, and endometrial cancer later in life. Breastfeeding basics Starting breastfeeding  Find a comfortable place to sit or lie down, with your neck and back well-supported.  Place a pillow or a rolled-up blanket under your baby to bring him or her to the level of your breast (if you are seated). Nursing pillows are specially designed to help support your arms and your baby while you breastfeed.  Make sure that your baby's tummy (abdomen) is facing your abdomen.  Gently massage your breast. With your fingertips, massage from the outer edges of your breast inward toward the nipple. This encourages   milk flow. If your milk flows slowly, you may need to continue this action during the feeding.  Support your breast with 4 fingers underneath and your thumb above your nipple (make the letter "C" with your hand). Make sure your fingers are well away from your nipple and your baby's mouth.  Stroke your baby's lips gently with your finger or nipple.  When your baby's mouth is open wide enough, quickly bring your baby to your breast, placing your entire nipple and as much of the areola as possible into your baby's mouth. The areola is the colored area around your nipple. ? More areola should be visible above your baby's upper lip than below the lower lip. ? Your baby's lips should be opened and extended outward (flanged) to ensure an adequate, comfortable latch. ? Your baby's tongue should be between his or her lower gum and your breast.  Make sure that your baby's mouth is correctly positioned around your nipple (latched). Your baby's lips should create a seal on your breast and be turned out (everted).  It is common for your baby to suck about 2-3 minutes in order to start the flow of breast milk. Latching Teaching your baby how to latch onto your breast properly is very  important. An improper latch can cause nipple pain, decreased milk supply, and poor weight gain in your baby. Also, if your baby is not latched onto your nipple properly, he or she may swallow some air during feeding. This can make your baby fussy. Burping your baby when you switch breasts during the feeding can help to get rid of the air. However, teaching your baby to latch on properly is still the best way to prevent fussiness from swallowing air while breastfeeding. Signs that your baby has successfully latched onto your nipple  Silent tugging or silent sucking, without causing you pain. Infant's lips should be extended outward (flanged).  Swallowing heard between every 3-4 sucks once your milk has started to flow (after your let-down milk reflex occurs).  Muscle movement above and in front of his or her ears while sucking. Signs that your baby has not successfully latched onto your nipple  Sucking sounds or smacking sounds from your baby while breastfeeding.  Nipple pain. If you think your baby has not latched on correctly, slip your finger into the corner of your baby's mouth to break the suction and place it between your baby's gums. Attempt to start breastfeeding again. Signs of successful breastfeeding Signs from your baby  Your baby will gradually decrease the number of sucks or will completely stop sucking.  Your baby will fall asleep.  Your baby's body will relax.  Your baby will retain a small amount of milk in his or her mouth.  Your baby will let go of your breast by himself or herself. Signs from you  Breasts that have increased in firmness, weight, and size 1-3 hours after feeding.  Breasts that are softer immediately after breastfeeding.  Increased milk volume, as well as a change in milk consistency and color by the fifth day of breastfeeding.  Nipples that are not sore, cracked, or bleeding. Signs that your baby is getting enough milk  Wetting at least 1-2  diapers during the first 24 hours after birth.  Wetting at least 5-6 diapers every 24 hours for the first week after birth. The urine should be clear or pale yellow by the age of 5 days.  Wetting 6-8 diapers every 24 hours as your baby continues   to grow and develop.  At least 3 stools in a 24-hour period by the age of 5 days. The stool should be soft and yellow.  At least 3 stools in a 24-hour period by the age of 7 days. The stool should be seedy and yellow.  No loss of weight greater than 10% of birth weight during the first 3 days of life.  Average weight gain of 4-7 oz (113-198 g) per week after the age of 4 days.  Consistent daily weight gain by the age of 5 days, without weight loss after the age of 2 weeks. After a feeding, your baby may spit up a small amount of milk. This is normal. Breastfeeding frequency and duration Frequent feeding will help you make more milk and can prevent sore nipples and extremely full breasts (breast engorgement). Breastfeed when you feel the need to reduce the fullness of your breasts or when your baby shows signs of hunger. This is called "breastfeeding on demand." Signs that your baby is hungry include:  Increased alertness, activity, or restlessness.  Movement of the head from side to side.  Opening of the mouth when the corner of the mouth or cheek is stroked (rooting).  Increased sucking sounds, smacking lips, cooing, sighing, or squeaking.  Hand-to-mouth movements and sucking on fingers or hands.  Fussing or crying. Avoid introducing a pacifier to your baby in the first 4-6 weeks after your baby is born. After this time, you may choose to use a pacifier. Research has shown that pacifier use during the first year of a baby's life decreases the risk of sudden infant death syndrome (SIDS). Allow your baby to feed on each breast as long as he or she wants. When your baby unlatches or falls asleep while feeding from the first breast, offer the  second breast. Because newborns are often sleepy in the first few weeks of life, you may need to awaken your baby to get him or her to feed. Breastfeeding times will vary from baby to baby. However, the following rules can serve as a guide to help you make sure that your baby is properly fed:  Newborns (babies 4 weeks of age or younger) may breastfeed every 1-3 hours.  Newborns should not go without breastfeeding for longer than 3 hours during the day or 5 hours during the night.  You should breastfeed your baby a minimum of 8 times in a 24-hour period. Breast milk pumping     Pumping and storing breast milk allows you to make sure that your baby is exclusively fed your breast milk, even at times when you are unable to breastfeed. This is especially important if you go back to work while you are still breastfeeding, or if you are not able to be present during feedings. Your lactation consultant can help you find a method of pumping that works best for you and give you guidelines about how long it is safe to store breast milk. Caring for your breasts while you breastfeed Nipples can become dry, cracked, and sore while breastfeeding. The following recommendations can help keep your breasts moisturized and healthy:  Avoid using soap on your nipples.  Wear a supportive bra designed especially for nursing. Avoid wearing underwire-style bras or extremely tight bras (sports bras).  Air-dry your nipples for 3-4 minutes after each feeding.  Use only cotton bra pads to absorb leaked breast milk. Leaking of breast milk between feedings is normal.  Use lanolin on your nipples after breastfeeding. Lanolin helps   to maintain your skin's normal moisture barrier. Pure lanolin is not harmful (not toxic) to your baby. You may also hand express a few drops of breast milk and gently massage that milk into your nipples and allow the milk to air-dry. In the first few weeks after giving birth, some women experience  breast engorgement. Engorgement can make your breasts feel heavy, warm, and tender to the touch. Engorgement peaks within 3-5 days after you give birth. The following recommendations can help to ease engorgement:  Completely empty your breasts while breastfeeding or pumping. You may want to start by applying warm, moist heat (in the shower or with warm, water-soaked hand towels) just before feeding or pumping. This increases circulation and helps the milk flow. If your baby does not completely empty your breasts while breastfeeding, pump any extra milk after he or she is finished.  Apply ice packs to your breasts immediately after breastfeeding or pumping, unless this is too uncomfortable for you. To do this: ? Put ice in a plastic bag. ? Place a towel between your skin and the bag. ? Leave the ice on for 20 minutes, 2-3 times a day.  Make sure that your baby is latched on and positioned properly while breastfeeding. If engorgement persists after 48 hours of following these recommendations, contact your health care provider or a lactation consultant. Overall health care recommendations while breastfeeding  Eat 3 healthy meals and 3 snacks every day. Well-nourished mothers who are breastfeeding need an additional 450-500 calories a day. You can meet this requirement by increasing the amount of a balanced diet that you eat.  Drink enough water to keep your urine pale yellow or clear.  Rest often, relax, and continue to take your prenatal vitamins to prevent fatigue, stress, and low vitamin and mineral levels in your body (nutrient deficiencies).  Do not use any products that contain nicotine or tobacco, such as cigarettes and e-cigarettes. Your baby may be harmed by chemicals from cigarettes that pass into breast milk and exposure to secondhand smoke. If you need help quitting, ask your health care provider.  Avoid alcohol.  Do not use illegal drugs or marijuana.  Talk with your health care  provider before taking any medicines. These include over-the-counter and prescription medicines as well as vitamins and herbal supplements. Some medicines that may be harmful to your baby can pass through breast milk.  It is possible to become pregnant while breastfeeding. If birth control is desired, ask your health care provider about options that will be safe while breastfeeding your baby. Where to find more information: [employer]: www.llli.org Contact a health care provider if:  You feel like you want to stop breastfeeding or have become frustrated with breastfeeding.  Your nipples are cracked or bleeding.  Your breasts are red, tender, or warm.  You have: ? Painful breasts or nipples. ? A swollen area on either breast. ? A fever or chills. ? Nausea or vomiting. ? Drainage other than breast milk from your nipples.  Your breasts do not become full before feedings by the fifth day after you give birth.  You feel sad and depressed.  Your baby is: ? Too sleepy to eat well. ? Having trouble sleeping. ? More than 1 week old and wetting fewer than 6 diapers in a 24-hour period. ? Not gaining weight by 5 days of age.  Your baby has fewer than 3 stools in a 24-hour period.  Your baby's skin or the white parts of   his or her eyes become yellow. Get help right away if:  Your baby is overly tired (lethargic) and does not want to wake up and feed.  Your baby develops an unexplained fever. Summary  Breastfeeding offers many health benefits for infant and mothers.  Try to breastfeed your infant when he or she shows early signs of hunger.  Gently tickle or stroke your baby's lips with your finger or nipple to allow the baby to open his or her mouth. Bring the baby to your breast. Make sure that much of the areola is in your baby's mouth. Offer one side and burp the baby before you offer the other side.  Talk with your health care provider or lactation consultant  if you have questions or you face problems as you breastfeed. This information is not intended to replace advice given to you by your health care provider. Make sure you discuss any questions you have with your health care provider. Document Revised: 08/08/2017 Document Reviewed: 06/15/2016 Elsevier Patient Education  2020 Elsevier Inc.  

## 2020-02-10 NOTE — Progress Notes (Signed)
   Subjective:    Lindsay Phillips is a G3P1011 [redacted]w[redacted]d being seen today for her first obstetrical visit.  Her obstetrical history is significant for short interval pregnancy with delivery 11/2018. Patient does intend to breast feed. Pregnancy history fully reviewed.  Patient reports nausea.  Vitals:   02/10/20 1348  BP: 116/79  Pulse: 66  Weight: 152 lb (68.9 kg)    HISTORY: OB History  Gravida Para Term Preterm AB Living  3 1 1   1 1   SAB TAB Ectopic Multiple Live Births    1   0 1    # Outcome Date GA Lbr Len/2nd Weight Sex Delivery Anes PTL Lv  3 Current           2 Term 12/03/18 [redacted]w[redacted]d 06:48 / 00:27 6 lb 8.8 oz (2.97 kg) M Vag-Spont None  LIV  1 TAB 06/2016           Past Medical History:  Diagnosis Date  . Medical history non-contributory    Past Surgical History:  Procedure Laterality Date  . INDUCED ABORTION     Family History  Problem Relation Age of Onset  . Healthy Mother   . Healthy Father   . Hypertension Maternal Grandmother      Exam    Uterus:     Pelvic Exam:    Perineum: No Hemorrhoids, Normal Perineum   Vulva: normal   Vagina:  normal mucosa, normal discharge   pH:    Cervix: multiparous appearance and cervix is closed and long   Adnexa: normal adnexa and no mass, fullness, tenderness   Bony Pelvis: gynecoid  System: Breast:  normal appearance, no masses or tenderness   Skin: normal coloration and turgor, no rashes    Neurologic: oriented, no focal deficits   Extremities: normal strength, tone, and muscle mass   HEENT extra ocular movement intact   Mouth/Teeth mucous membranes moist, pharynx normal without lesions and dental hygiene good   Neck supple and no masses   Cardiovascular: regular rate and rhythm   Respiratory:  appears well, vitals normal, no respiratory distress, acyanotic, normal RR, chest clear, no wheezing, crepitations, rhonchi, normal symmetric air entry   Abdomen: soft, non-tender; bowel sounds normal; no masses,  no  organomegaly   Urinary:       Assessment:    Pregnancy: G3P1011 Patient Active Problem List   Diagnosis Date Noted  . Supervision of other normal pregnancy, antepartum 02/10/2020  . Rh negative state in antepartum period 02/10/2020  . Short interval between pregnancies affecting pregnancy, antepartum 02/10/2020  . High priority for COVID-19 virus vaccination 01/26/2020  . Postpartum care following vaginal delivery 12/03/2018        Plan:     Initial labs drawn. Prenatal vitamins. Problem list reviewed and updated. Genetic Screening discussed : Panorama next visit.  Ultrasound discussed; fetal survey: to be ordered at next visit. Rx Phenergan provided  Follow up in 4 weeks. 50% of 30 min visit spent on counseling and coordination of care.     Leonid Manus 02/10/2020

## 2020-02-11 LAB — HCV INTERPRETATION

## 2020-02-11 LAB — CBC/D/PLT+RPR+RH+ABO+RUB AB...
Antibody Screen: NEGATIVE
Basophils Absolute: 0.1 10*3/uL (ref 0.0–0.2)
Basos: 1 %
EOS (ABSOLUTE): 0 10*3/uL (ref 0.0–0.4)
Eos: 0 %
HCV Ab: <0.1 s/co ratio (ref 0.0–0.9)
HIV Screen 4th Generation wRfx: NONREACTIVE
Hematocrit: 44.1 % (ref 34.0–46.6)
Hemoglobin: 14 g/dL (ref 11.1–15.9)
Hepatitis B Surface Ag: NEGATIVE
Immature Grans (Abs): 0 10*3/uL (ref 0.0–0.1)
Immature Granulocytes: 0 %
Lymphocytes Absolute: 2.5 10*3/uL (ref 0.7–3.1)
Lymphs: 26 %
MCH: 27.8 pg (ref 26.6–33.0)
MCHC: 31.7 g/dL (ref 31.5–35.7)
MCV: 88 fL (ref 79–97)
Monocytes Absolute: 0.5 10*3/uL (ref 0.1–0.9)
Monocytes: 6 %
Neutrophils Absolute: 6.4 10*3/uL (ref 1.4–7.0)
Neutrophils: 67 %
Platelets: 284 10*3/uL (ref 150–450)
RBC: 5.04 x10E6/uL (ref 3.77–5.28)
RDW: 11.8 % (ref 11.7–15.4)
RPR Ser Ql: NONREACTIVE
Rh Factor: NEGATIVE
Rubella Antibodies, IGG: 2.84 index (ref >0.99)
WBC: 9.6 10*3/uL (ref 3.4–10.8)

## 2020-02-12 LAB — GC/CHLAMYDIA PROBE AMP (~~LOC~~) NOT AT ARMC
Chlamydia: NEGATIVE
Comment: NEGATIVE
Comment: NORMAL
Neisseria Gonorrhea: NEGATIVE

## 2020-02-12 LAB — URINE CULTURE

## 2020-03-09 ENCOUNTER — Ambulatory Visit (INDEPENDENT_AMBULATORY_CARE_PROVIDER_SITE_OTHER): Payer: BC Managed Care – PPO | Admitting: Family Medicine

## 2020-03-09 ENCOUNTER — Encounter: Payer: Self-pay | Admitting: Family Medicine

## 2020-03-09 ENCOUNTER — Other Ambulatory Visit: Payer: Self-pay

## 2020-03-09 VITALS — BP 126/72 | HR 76 | Wt 152.0 lb

## 2020-03-09 DIAGNOSIS — O26899 Other specified pregnancy related conditions, unspecified trimester: Secondary | ICD-10-CM

## 2020-03-09 DIAGNOSIS — Z3482 Encounter for supervision of other normal pregnancy, second trimester: Secondary | ICD-10-CM | POA: Diagnosis not present

## 2020-03-09 DIAGNOSIS — Z3A13 13 weeks gestation of pregnancy: Secondary | ICD-10-CM

## 2020-03-09 DIAGNOSIS — Z6791 Unspecified blood type, Rh negative: Secondary | ICD-10-CM

## 2020-03-09 DIAGNOSIS — Z3480 Encounter for supervision of other normal pregnancy, unspecified trimester: Secondary | ICD-10-CM

## 2020-03-09 DIAGNOSIS — Z31430 Encounter of female for testing for genetic disease carrier status for procreative management: Secondary | ICD-10-CM | POA: Diagnosis not present

## 2020-03-09 NOTE — Progress Notes (Signed)
   PRENATAL VISIT NOTE  Subjective:  Lindsay Phillips is a 24 y.o. G3P1011 at [redacted]w[redacted]d being seen today for ongoing prenatal care.  She is currently monitored for the following issues for this low-risk pregnancy and has Postpartum care following vaginal delivery; High priority for COVID-19 virus vaccination; Supervision of other normal pregnancy, antepartum; Rh negative state in antepartum period; and Short interval between pregnancies affecting pregnancy, antepartum on their problem list.  Patient reports nausea.   . Vag. Bleeding: None.  Movement: Absent. Denies leaking of fluid.   The following portions of the patient's history were reviewed and updated as appropriate: allergies, current medications, past family history, past medical history, past social history, past surgical history and problem list.   Objective:   Vitals:   03/09/20 1304  BP: 126/72  Pulse: 76  Weight: 152 lb (68.9 kg)    Fetal Status: Fetal Heart Rate (bpm): 157   Movement: Absent     General:  Alert, oriented and cooperative. Patient is in no acute distress.  Skin: Skin is warm and dry. No rash noted.   Cardiovascular: Normal heart rate noted  Respiratory: Normal respiratory effort, no problems with respiration noted  Abdomen: Soft, gravid, appropriate for gestational age.  Pain/Pressure: Absent     Pelvic: Cervical exam deferred        Extremities: Normal range of motion.     Mental Status: Normal mood and affect. Normal behavior. Normal judgment and thought content.   Assessment and Plan:  Pregnancy: G3P1011 at [redacted]w[redacted]d 1. [redacted] weeks gestation of pregnancy 2. Supervision of other normal pregnancy, antepartum FHT and FH normal Panorama today  3. Rh negative state in antepartum period Rhogam at 28 weeks or with vaginal bleeding  Preterm labor symptoms and general obstetric precautions including but not limited to vaginal bleeding, contractions, leaking of fluid and fetal movement were reviewed in detail with the  patient. Please refer to After Visit Summary for other counseling recommendations.   No follow-ups on file.  Future Appointments  Date Time Provider Department Center  04/20/2020  8:45 AM [facility]    Levie Heritage, DO

## 2020-03-17 ENCOUNTER — Other Ambulatory Visit: Payer: Self-pay

## 2020-03-22 DIAGNOSIS — Z20822 Contact with and (suspected) exposure to covid-19: Secondary | ICD-10-CM | POA: Diagnosis not present

## 2020-04-07 ENCOUNTER — Other Ambulatory Visit: Payer: Self-pay

## 2020-04-07 ENCOUNTER — Ambulatory Visit (INDEPENDENT_AMBULATORY_CARE_PROVIDER_SITE_OTHER): Payer: BC Managed Care – PPO | Admitting: Family Medicine

## 2020-04-07 VITALS — BP 114/71 | HR 69 | Wt 153.0 lb

## 2020-04-07 DIAGNOSIS — Z3A17 17 weeks gestation of pregnancy: Secondary | ICD-10-CM

## 2020-04-07 DIAGNOSIS — Z6791 Unspecified blood type, Rh negative: Secondary | ICD-10-CM

## 2020-04-07 DIAGNOSIS — O26899 Other specified pregnancy related conditions, unspecified trimester: Secondary | ICD-10-CM

## 2020-04-07 DIAGNOSIS — Z3480 Encounter for supervision of other normal pregnancy, unspecified trimester: Secondary | ICD-10-CM

## 2020-04-07 DIAGNOSIS — O09899 Supervision of other high risk pregnancies, unspecified trimester: Secondary | ICD-10-CM

## 2020-04-07 NOTE — Progress Notes (Signed)
   PRENATAL VISIT NOTE  Subjective:  Lindsay Phillips is a 24 y.o. G3P1011 at [redacted]w[redacted]d being seen today for ongoing prenatal care.  She is currently monitored for the following issues for this low-risk pregnancy and has Postpartum care following vaginal delivery; High priority for COVID-19 virus vaccination; Supervision of other normal pregnancy, antepartum; Rh negative state in antepartum period; and Short interval between pregnancies affecting pregnancy, antepartum on their problem list.  Patient reports no complaints.  Contractions: Not present. Vag. Bleeding: None.  Movement: Present. Denies leaking of fluid.   The following portions of the patient's history were reviewed and updated as appropriate: allergies, current medications, past family history, past medical history, past social history, past surgical history and problem list.   Objective:   Vitals:   04/07/20 0900  BP: 114/71  Pulse: 69  Weight: 153 lb (69.4 kg)    Fetal Status: Fetal Heart Rate (bpm): 155   Movement: Present     General:  Alert, oriented and cooperative. Patient is in no acute distress.  Skin: Skin is warm and dry. No rash noted.   Cardiovascular: Normal heart rate noted  Respiratory: Normal respiratory effort, no problems with respiration noted  Abdomen: Soft, gravid, appropriate for gestational age.  Pain/Pressure: Absent     Pelvic: Cervical exam deferred        Extremities: Normal range of motion.  Edema: None  Mental Status: Normal mood and affect. Normal behavior. Normal judgment and thought content.   Assessment and Plan:  Pregnancy: G3P1011 at [redacted]w[redacted]d 1. [redacted] weeks gestation of pregnancy  2. Supervision of other normal pregnancy, antepartum FHT and FH normal  3. Rh negative state in antepartum period Rhogam at 28 weeks  4. Short interval between pregnancies affecting pregnancy, antepartum   Preterm labor symptoms and general obstetric precautions including but not limited to vaginal bleeding,  contractions, leaking of fluid and fetal movement were reviewed in detail with the patient. Please refer to After Visit Summary for other counseling recommendations.   Return in about 4 weeks (around 05/05/2020).  Future Appointments  Date Time Provider Department Center  04/26/2020  8:45 AM [facility]    Levie Heritage, DO

## 2020-04-20 ENCOUNTER — Ambulatory Visit: Payer: BC Managed Care – PPO

## 2020-04-26 ENCOUNTER — Other Ambulatory Visit: Payer: Self-pay | Admitting: *Deleted

## 2020-04-26 ENCOUNTER — Other Ambulatory Visit: Payer: Self-pay

## 2020-04-26 ENCOUNTER — Ambulatory Visit: Payer: BC Managed Care – PPO | Attending: Obstetrics and Gynecology

## 2020-04-26 DIAGNOSIS — O09892 Supervision of other high risk pregnancies, second trimester: Secondary | ICD-10-CM

## 2020-04-26 DIAGNOSIS — Z3480 Encounter for supervision of other normal pregnancy, unspecified trimester: Secondary | ICD-10-CM | POA: Diagnosis not present

## 2020-05-05 ENCOUNTER — Ambulatory Visit (INDEPENDENT_AMBULATORY_CARE_PROVIDER_SITE_OTHER): Payer: BC Managed Care – PPO | Admitting: Family Medicine

## 2020-05-05 ENCOUNTER — Telehealth: Payer: Self-pay

## 2020-05-05 ENCOUNTER — Other Ambulatory Visit: Payer: Self-pay

## 2020-05-05 VITALS — BP 103/61 | HR 101 | Wt 157.0 lb

## 2020-05-05 DIAGNOSIS — O26899 Other specified pregnancy related conditions, unspecified trimester: Secondary | ICD-10-CM

## 2020-05-05 DIAGNOSIS — Z6791 Unspecified blood type, Rh negative: Secondary | ICD-10-CM

## 2020-05-05 DIAGNOSIS — Z3A21 21 weeks gestation of pregnancy: Secondary | ICD-10-CM

## 2020-05-05 DIAGNOSIS — Z3480 Encounter for supervision of other normal pregnancy, unspecified trimester: Secondary | ICD-10-CM

## 2020-05-05 DIAGNOSIS — O09899 Supervision of other high risk pregnancies, unspecified trimester: Secondary | ICD-10-CM

## 2020-05-05 NOTE — Telephone Encounter (Signed)
Returned pt call. Pt states that she received a call stating that she had type 2 diabetes and she needed a fetal echocardiogram. I spoke with Dr.Stinson and he asked me to let pt know that he reviewed the u/s and it looks like MFM documented on the wrong pt and that he is working with the MFM doctors to get this corrected. He said to let her know that she is not diabetic and she does not need the follow up they recommended. I spoke with pt and and she is aware of what Dr.Stinson has said.

## 2020-05-05 NOTE — Progress Notes (Signed)
   PRENATAL VISIT NOTE  Subjective:  Lindsay Phillips is a 24 y.o. G3P1011 at [redacted]w[redacted]d being seen today for ongoing prenatal care.  She is currently monitored for the following issues for this low-risk pregnancy and has Postpartum care following vaginal delivery; High priority for COVID-19 virus vaccination; Supervision of other normal pregnancy, antepartum; Rh negative state in antepartum period; and Short interval between pregnancies affecting pregnancy, antepartum on their problem list.  Patient reports no complaints.  Contractions: Not present. Vag. Bleeding: None.  Movement: Present. Denies leaking of fluid.   The following portions of the patient's history were reviewed and updated as appropriate: allergies, current medications, past family history, past medical history, past social history, past surgical history and problem list.   Objective:   Vitals:   05/05/20 0846  BP: 103/61  Pulse: (!) 101  Weight: 157 lb (71.2 kg)    Fetal Status: Fetal Heart Rate (bpm): 159   Movement: Present     General:  Alert, oriented and cooperative. Patient is in no acute distress.  Skin: Skin is warm and dry. No rash noted.   Cardiovascular: Normal heart rate noted  Respiratory: Normal respiratory effort, no problems with respiration noted  Abdomen: Soft, gravid, appropriate for gestational age.  Pain/Pressure: Present     Pelvic: Cervical exam deferred        Extremities: Normal range of motion.  Edema: None  Mental Status: Normal mood and affect. Normal behavior. Normal judgment and thought content.   Assessment and Plan:  Pregnancy: G3P1011 at [redacted]w[redacted]d 1. [redacted] weeks gestation of pregnancy - AFP, Serum, Open Spina Bifida  2. Supervision of other normal pregnancy, antepartum - AFP, Serum, Open Spina Bifida  3. Rh negative state in antepartum period Rhogam at 28 weeks - AFP, Serum, Open Spina Bifida  4. Short interval between pregnancies affecting pregnancy, antepartum - AFP, Serum, Open Spina  Bifida  Preterm labor symptoms and general obstetric precautions including but not limited to vaginal bleeding, contractions, leaking of fluid and fetal movement were reviewed in detail with the patient. Please refer to After Visit Summary for other counseling recommendations.   Return in about 4 weeks (around 06/02/2020).  Future Appointments  Date Time Provider Department Center  05/25/2020  1:00 PM [facility] US1 [facility]  06/02/2020  9:30 AM Levie Heritage, DO [facility] None    Levie Heritage, DO

## 2020-05-07 LAB — AFP, SERUM, OPEN SPINA BIFIDA
AFP MoM: 1.26
AFP Value: 89.8 ng/mL
Gest. Age on Collection Date: 21.2 weeks
Maternal Age At EDD: 25.8 yr
OSBR Risk 1 IN: 10000
Test Results:: NEGATIVE
Weight: 157 [lb_av]

## 2020-05-10 DIAGNOSIS — Z20822 Contact with and (suspected) exposure to covid-19: Secondary | ICD-10-CM | POA: Diagnosis not present

## 2020-05-14 ENCOUNTER — Other Ambulatory Visit: Payer: Self-pay | Admitting: Infectious Diseases

## 2020-05-14 ENCOUNTER — Telehealth (HOSPITAL_COMMUNITY): Payer: Self-pay | Admitting: Nurse Practitioner

## 2020-05-14 DIAGNOSIS — Z3480 Encounter for supervision of other normal pregnancy, unspecified trimester: Secondary | ICD-10-CM

## 2020-05-14 DIAGNOSIS — U071 COVID-19: Secondary | ICD-10-CM

## 2020-05-14 NOTE — Telephone Encounter (Signed)
Called to Discuss with patient about Covid symptoms and the use of a monoclonal antibody infusion for those with mild to moderate Covid symptoms and at a high risk of hospitalization.     Pt is qualified for this infusion at the [facility] infusion center due to co-morbid conditions and/or a member of an at-risk group.     Unable to reach pt. Left message to return call.   Charla Criscione, DNP, AGNP-C [phone] (Infusion Center Hotline)  

## 2020-05-14 NOTE — Telephone Encounter (Signed)
Called to Discuss with patient about Covid symptoms and the use of the monoclonal antibody infusion for those with mild to moderate Covid symptoms and at a high risk of hospitalization.     Pt appears to qualify for this infusion due to co-morbid conditions and/or a member of an at-risk group in accordance with the FDA Emergency Use Authorization.    Currently meets criteria with high SVI risk score and pregnancy (22 weeks).   Symptom onset Monday 12/13.   She is scheduled for Monday 12/20 (last day to treat).  She is aware [facility] plan does not cover this therapy - has secondary medicaid where she will have treatment filed.  She will email results of positive test from [facility].    [name], MSN, NP-C [facility]  [email or web address] Pager: [phone] Office: [phone] [facility] Main Line: [phone]

## 2020-05-14 NOTE — Progress Notes (Signed)
I connected by phone with Lindsay Phillips on 05/14/2020 at 6:20 PM to discuss the potential use of a new treatment for mild to moderate COVID-19 viral infection in non-hospitalized patients.  This patient is a 24 y.o. female that meets the FDA criteria for Emergency Use Authorization of COVID monoclonal antibody casirivimab/imdevimab, bamlanivimab/etesevimab, or sotrovimab.  Has a (+) direct SARS-CoV-2 viral test result  Has mild or moderate COVID-19   Is NOT hospitalized due to COVID-19  Is within 10 days of symptom onset  Has at least one of the high risk factor(s) for progression to severe COVID-19 and/or hospitalization as defined in EUA.  Specific high risk criteria : Pregnancy and Other high risk medical condition per CDC:  SVI score 3   I have spoken and communicated the following to the patient or parent/caregiver regarding COVID monoclonal antibody treatment:  1. FDA has authorized the emergency use for the treatment of mild to moderate COVID-19 in adults and pediatric patients with positive results of direct SARS-CoV-2 viral testing who are 37 years of age and older weighing at least 40 kg, and who are at high risk for progressing to severe COVID-19 and/or hospitalization.  2. The significant known and potential risks and benefits of COVID monoclonal antibody, and the extent to which such potential risks and benefits are unknown.  3. Information on available alternative treatments and the risks and benefits of those alternatives, including clinical trials.  4. Patients treated with COVID monoclonal antibody should continue to self-isolate and use infection control measures (e.g., wear mask, isolate, social distance, avoid sharing personal items, clean and disinfect "high touch" surfaces, and frequent handwashing) according to CDC guidelines.   5. The patient or parent/caregiver has the option to accept or refuse COVID monoclonal antibody treatment.  After reviewing this  information with the patient, the patient has agreed to receive one of the available covid 19 monoclonal antibodies and will be provided an appropriate fact sheet prior to infusion. [name], NP 05/14/2020 6:20 PM

## 2020-05-16 ENCOUNTER — Ambulatory Visit (HOSPITAL_COMMUNITY)
Admission: RE | Admit: 2020-05-16 | Discharge: 2020-05-16 | Disposition: A | Discharge status: Home / Self Care | Payer: BC Managed Care – PPO | Source: Ambulatory Visit | Attending: Pulmonary Disease | Admitting: Pulmonary Disease

## 2020-05-16 ENCOUNTER — Inpatient Hospital Stay (HOSPITAL_COMMUNITY)
Admission: AD | Admit: 2020-05-16 | Discharge: 2020-05-16 | Disposition: A | Discharge status: Home / Self Care | Payer: BC Managed Care – PPO | Attending: Obstetrics and Gynecology | Admitting: Obstetrics and Gynecology

## 2020-05-16 ENCOUNTER — Other Ambulatory Visit: Payer: Self-pay | Admitting: Family Medicine

## 2020-05-16 ENCOUNTER — Telehealth: Payer: Self-pay

## 2020-05-16 DIAGNOSIS — B009 Herpesviral infection, unspecified: Secondary | ICD-10-CM | POA: Diagnosis not present

## 2020-05-16 DIAGNOSIS — U071 COVID-19: Secondary | ICD-10-CM | POA: Insufficient documentation

## 2020-05-16 DIAGNOSIS — Z3480 Encounter for supervision of other normal pregnancy, unspecified trimester: Secondary | ICD-10-CM

## 2020-05-16 MED ORDER — DIPHENHYDRAMINE HCL 50 MG/ML IJ SOLN: 50.0000 mg | Freq: Once | INTRAMUSCULAR | Status: DC | PRN

## 2020-05-16 MED ORDER — FAMOTIDINE IN NACL 20-0.9 MG/50ML-% IV SOLN: 20.0000 mg | Freq: Once | INTRAVENOUS | Status: DC | PRN

## 2020-05-16 MED ORDER — SODIUM CHLORIDE 0.9 % IV SOLN: Freq: Once | INTRAVENOUS | Status: AC

## 2020-05-16 MED ORDER — METHYLPREDNISOLONE SODIUM SUCC 125 MG IJ SOLR: 125.0000 mg | Freq: Once | INTRAMUSCULAR | Status: DC | PRN

## 2020-05-16 MED ORDER — VALACYCLOVIR HCL 1 G PO TABS: 1000.0000 mg | ORAL_TABLET | Freq: Two times a day (BID) | ORAL | 0 refills | Status: AC

## 2020-05-16 MED ORDER — EPINEPHRINE 0.3 MG/0.3ML IJ SOAJ: 0.3000 mg | Freq: Once | INTRAMUSCULAR | Status: DC | PRN

## 2020-05-16 MED ORDER — ALBUTEROL SULFATE HFA 108 (90 BASE) MCG/ACT IN AERS: 2.0000 | INHALATION_SPRAY | Freq: Once | RESPIRATORY_TRACT | Status: DC | PRN

## 2020-05-16 MED ORDER — SODIUM CHLORIDE 0.9 % IV SOLN: INTRAVENOUS | Status: DC | PRN

## 2020-05-16 NOTE — Telephone Encounter (Signed)
Called patient - having mild symptoms of COVID: cough, no SOB.   Developed bumps in the corner of her eye after rubbing it earlier today. Having burning type pain. No history of HSV.  Will have patient come to [facility] for testing for HSV and varicella.  Will start treatment with Valtrex 1g BID.

## 2020-05-16 NOTE — Telephone Encounter (Signed)
Patient called and made [place] aware that she has COVID and plans to get Monoconial antibodies later today. Patient wanted to know if we want to know if we recommend it. Made aware Dr. Erin Fulling recommends it. Armandina Stammer RN

## 2020-05-16 NOTE — Progress Notes (Signed)
  Diagnosis: COVID-19  Physician: Dr. Wright   Procedure: Covid Infusion Clinic Med: bamlanivimab\etesevimab infusion - Provided patient with bamlanimivab\etesevimab fact sheet for patients, parents and caregivers prior to infusion.  Complications: No immediate complications noted.  Discharge: Discharged home   Eliany Mccarter  B Shantika Bermea 05/16/2020   

## 2020-05-16 NOTE — Discharge Instructions (Signed)
10 Things You Can Do to Manage Your COVID-19 Symptoms at Home If you have possible or confirmed COVID-19: 1. Stay home from work and school. And stay away from other public places. If you must go out, avoid using any kind of public transportation, ridesharing, or taxis. 2. Monitor your symptoms carefully. If your symptoms get worse, call your healthcare provider immediately. 3. Get rest and stay hydrated. 4. If you have a medical appointment, call the healthcare provider ahead of time and tell them that you have or may have COVID-19. 5. For medical emergencies, call 911 and notify the dispatch personnel that you have or may have COVID-19. 6. Cover your cough and sneezes with a tissue or use the inside of your elbow. 7. Wash your hands often with soap and water for at least 20 seconds or clean your hands with an alcohol-based hand sanitizer that contains at least 60% alcohol. 8. As much as possible, stay in a specific room and away from other people in your home. Also, you should use a separate bathroom, if available. If you need to be around other people in or outside of the home, wear a mask. 9. Avoid sharing personal items with other people in your household, like dishes, towels, and bedding. 10. Clean all surfaces that are touched often, like counters, tabletops, and doorknobs. Use household cleaning sprays or wipes according to the label instructions. cdc.gov/coronavirus 11/26/2018 This information is not intended to replace advice given to you by your health care provider. Make sure you discuss any questions you have with your health care provider. Document Revised: 04/30/2019 Document Reviewed: 04/30/2019 Elsevier Patient Education  2020 Elsevier Inc. What types of side effects do monoclonal antibody drugs cause?  Common side effects  In general, the more common side effects caused by monoclonal antibody drugs include: . Allergic reactions, such as hives or itching . Flu-like signs and  symptoms, including chills, fatigue, fever, and muscle aches and pains . Nausea, vomiting . Diarrhea . Skin rashes . Low blood pressure   The CDC is recommending patients who receive monoclonal antibody treatments wait at least 90 days before being vaccinated.  Currently, there are no data on the safety and efficacy of mRNA COVID-19 vaccines in persons who received monoclonal antibodies or convalescent plasma as part of COVID-19 treatment. Based on the estimated half-life of such therapies as well as evidence suggesting that reinfection is uncommon in the 90 days after initial infection, vaccination should be deferred for at least 90 days, as a precautionary measure until additional information becomes available, to avoid interference of the antibody treatment with vaccine-induced immune responses. If you have any questions or concerns after the infusion please call the Advanced Practice Provider on call at [phone]. This number is ONLY intended for your use regarding questions or concerns about the infusion post-treatment side-effects.  Please do not provide this number to others for use. For return to work notes please contact your primary care provider.   If someone you know is interested in receiving treatment please have them call the COVID hotline at [phone].   

## 2020-05-16 NOTE — [facility] Note (Addendum)
Pt taken directly to rm 126, for isolation purposes(covid +). Pt here for outpt lab work only.  Pt has new ? Outbreak, vesicles by rt eye. Dr Adrian Blackwater in to collect swab spec.  Lab called regarding blood draw. Then pt will be dc'd.

## 2020-05-16 NOTE — [facility] Provider Note (Signed)
Patient here with bumps in the lateral corner of right eye. Labs done (unable to get outpt labs due to + COVID status). 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 Will follow. Start Valtrex 1000mg  BID.  Patient otherwise stable.

## 2020-05-16 NOTE — Progress Notes (Signed)
Patient reviewed Fact Sheet for Patients, Parents, and Caregivers for Emergency Use Authorization (EUA) of bamlanivimab and etesevimab for the Treatment of Coronavirus. Patient also reviewed and is agreeable to the estimated cost of treatment. Patient is agreeable to proceed.   

## 2020-05-17 LAB — VARICELLA ZOSTER ANTIBODY, IGM: Varicella-Zoster Ab, IgM: <0.91 index (ref 0.00–0.90)

## 2020-05-17 LAB — VARICELLA ZOSTER ANTIBODY, IGG: Varicella IgG: <135 index — ABNORMAL LOW (ref >165)

## 2020-05-17 LAB — HSV(HERPES SIMPLEX VRS) I + II AB-IGG
HSV 1 Glycoprotein G Ab, IgG: 36.3 index — ABNORMAL HIGH (ref 0.00–0.90)
HSV 2 Glycoprotein G Ab, IgG: <0.91 index (ref 0.00–0.90)

## 2020-05-18 LAB — HSV CULTURE AND TYPING

## 2020-05-18 LAB — HSV(HERPES SIMPLEX VRS) I + II AB-IGM: HSVI/II Comb IgM: <0.91 Ratio (ref 0.00–0.90)

## 2020-05-25 ENCOUNTER — Ambulatory Visit: Payer: BC Managed Care – PPO

## 2020-05-28 NOTE — L&D Delivery Note (Signed)
OB/GYN Faculty Practice Delivery Note  Lindsay Phillips is a 25 y.o. G3P1011 s/p SVD at [redacted]w[redacted]d. She was admitted for labor.   ROM: rupture date, rupture time, delivery date, or delivery time have not been documented with clear fluid GBS Status:  Negative/-- (03/29 0932) Maximum Maternal Temperature:   Labor Progress: . Initial SVE: 6 cm on arrival. She then progressed to complete.   Delivery Date/Time: [phone] on 4/20 Delivery: Called to room and patient was complete and pushing in hands and knees. In one push she delivered head LOA. Shoulder and body delivered in usual fashion. Loose body nuchal reduced after delivery. Infant with spontaneous cry, placed on mother's abdomen, dried and stimulated. Cord clamped x 2 after 1-minute delay, and cut by [facility]. Cord blood drawn. Placenta delivered spontaneously with gentle cord traction. Fundus firm with massage and Pitocin. Labia, perineum, vagina, and cervix inspected inspected with no lacerations.  Baby Weight: pending  Placenta: Sent to L&D Complications: None Lacerations: none EBL: 55 mL Analgesia: none   Infant:  APGAR (1 MIN): 9   APGAR (5 MINS): 9   APGAR (10 MINS):     Casper Harrison, MD [facility] Family Medicine Fellow, [facility] for [facility], [facility] 09/14/2020, 9:26 AM

## 2020-06-02 ENCOUNTER — Other Ambulatory Visit: Payer: Self-pay

## 2020-06-02 ENCOUNTER — Ambulatory Visit (INDEPENDENT_AMBULATORY_CARE_PROVIDER_SITE_OTHER): Payer: Medicaid Other | Admitting: Family Medicine

## 2020-06-02 VITALS — BP 110/70 | HR 90

## 2020-06-02 DIAGNOSIS — O09899 Supervision of other high risk pregnancies, unspecified trimester: Secondary | ICD-10-CM

## 2020-06-02 DIAGNOSIS — O26899 Other specified pregnancy related conditions, unspecified trimester: Secondary | ICD-10-CM

## 2020-06-02 DIAGNOSIS — Z6791 Unspecified blood type, Rh negative: Secondary | ICD-10-CM

## 2020-06-02 DIAGNOSIS — B009 Herpesviral infection, unspecified: Secondary | ICD-10-CM

## 2020-06-02 DIAGNOSIS — Z3480 Encounter for supervision of other normal pregnancy, unspecified trimester: Secondary | ICD-10-CM

## 2020-06-02 NOTE — Progress Notes (Signed)
   PRENATAL VISIT NOTE  Subjective:  Lindsay Phillips is a 25 y.o. G3P1011 at [redacted]w[redacted]d being seen today for ongoing prenatal care.  She is currently monitored for the following issues for this low-risk pregnancy and has Postpartum care following vaginal delivery; High priority for COVID-19 virus vaccination; Supervision of other normal pregnancy, antepartum; Rh negative state in antepartum period; and Short interval between pregnancies affecting pregnancy, antepartum on their problem list.  Patient reports no complaints.  Contractions: Not present.  .  Movement: Present. Denies leaking of fluid.   The following portions of the patient's history were reviewed and updated as appropriate: allergies, current medications, past family history, past medical history, past social history, past surgical history and problem list.   Objective:   Vitals:   06/02/20 0950  BP: 110/70  Pulse: 90    Fetal Status: Fetal Heart Rate (bpm): 152 Fundal Height: 25 cm Movement: Present     General:  Alert, oriented and cooperative. Patient is in no acute distress.  Skin: Skin is warm and dry. No rash noted.   Cardiovascular: Normal heart rate noted  Respiratory: Normal respiratory effort, no problems with respiration noted  Abdomen: Soft, gravid, appropriate for gestational age.  Pain/Pressure: Present     Pelvic: Cervical exam deferred        Extremities: Normal range of motion.  Edema: None  Mental Status: Normal mood and affect. Normal behavior. Normal judgment and thought content.   Assessment and Plan:  Pregnancy: G3P1011 at [redacted]w[redacted]d 1. Supervision of other normal pregnancy, antepartum FHT and FH normal. Interested in Systems developer. Will meet with CNM next visit  2. Rh negative state in antepartum period Rhogam next visit  3. Short interval between pregnancies affecting pregnancy, antepartum  4. Herpetic lesions of face resolved   Preterm labor symptoms and general obstetric precautions including but not  limited to vaginal bleeding, contractions, leaking of fluid and fetal movement were reviewed in detail with the patient. Please refer to After Visit Summary for other counseling recommendations.   Return in about 4 weeks (around 06/30/2020) for OB f/u, 2 hr GTT, In Office (with Hilda Lias or midwife if possible).  Future Appointments  Date Time Provider Department Center  06/10/2020  2:45 PM [facility] US5 [facility]    Levie Heritage, DO

## 2020-06-10 ENCOUNTER — Ambulatory Visit: Payer: Medicaid Other | Attending: Maternal & Fetal Medicine

## 2020-06-10 ENCOUNTER — Other Ambulatory Visit: Payer: Self-pay

## 2020-06-10 DIAGNOSIS — Z3A26 26 weeks gestation of pregnancy: Secondary | ICD-10-CM | POA: Diagnosis not present

## 2020-06-10 DIAGNOSIS — Z363 Encounter for antenatal screening for malformations: Secondary | ICD-10-CM

## 2020-06-10 DIAGNOSIS — O360192 Maternal care for anti-D [Rh] antibodies, unspecified trimester, fetus 2: Secondary | ICD-10-CM | POA: Diagnosis not present

## 2020-06-10 DIAGNOSIS — O09892 Supervision of other high risk pregnancies, second trimester: Secondary | ICD-10-CM

## 2020-06-27 DIAGNOSIS — Z20822 Contact with and (suspected) exposure to covid-19: Secondary | ICD-10-CM | POA: Diagnosis not present

## 2020-06-28 ENCOUNTER — Ambulatory Visit (INDEPENDENT_AMBULATORY_CARE_PROVIDER_SITE_OTHER): Payer: Medicaid Other | Admitting: Advanced Practice Midwife

## 2020-06-28 ENCOUNTER — Encounter: Payer: Self-pay | Admitting: Advanced Practice Midwife

## 2020-06-28 ENCOUNTER — Other Ambulatory Visit: Payer: Self-pay

## 2020-06-28 VITALS — BP 110/63 | HR 87 | Wt 172.0 lb

## 2020-06-28 DIAGNOSIS — Z6791 Unspecified blood type, Rh negative: Secondary | ICD-10-CM

## 2020-06-28 DIAGNOSIS — Z3A29 29 weeks gestation of pregnancy: Secondary | ICD-10-CM

## 2020-06-28 DIAGNOSIS — O09893 Supervision of other high risk pregnancies, third trimester: Secondary | ICD-10-CM

## 2020-06-28 DIAGNOSIS — O26893 Other specified pregnancy related conditions, third trimester: Secondary | ICD-10-CM

## 2020-06-28 DIAGNOSIS — O09899 Supervision of other high risk pregnancies, unspecified trimester: Secondary | ICD-10-CM

## 2020-06-28 DIAGNOSIS — O360931 Maternal care for other rhesus isoimmunization, third trimester, fetus 1: Secondary | ICD-10-CM

## 2020-06-28 DIAGNOSIS — O26899 Other specified pregnancy related conditions, unspecified trimester: Secondary | ICD-10-CM

## 2020-06-28 DIAGNOSIS — Z3480 Encounter for supervision of other normal pregnancy, unspecified trimester: Secondary | ICD-10-CM

## 2020-06-28 DIAGNOSIS — O360930 Maternal care for other rhesus isoimmunization, third trimester, not applicable or unspecified: Secondary | ICD-10-CM

## 2020-06-28 MED ORDER — RHO D IMMUNE GLOBULIN 1500 UNIT/2ML IJ SOSY
300.0000 ug | PREFILLED_SYRINGE | Freq: Once | INTRAMUSCULAR | Status: AC
  Administered 2020-06-28: 300 ug via INTRAMUSCULAR

## 2020-06-28 NOTE — Patient Instructions (Signed)
Thinking About Waterbirth???  You must attend a Waterbirth class at [facility]  3rd Wednesday of every month from 7-9pm  Free  Register by calling [phone] or online at [email or web address]  Bring us the certificate from the class  Waterbirth supplies needed for [facility] patients:  Our practice has a Birth Pool in a Box tub at the hospital that you can borrow  You will need to purchase an accessory kit that has all needed supplies through [facility] (  ) or online  Or you can purchase the supplies separately: o Single-use disposable tub liner for Birth Pool in a Box (REGULAR size) o New garden hose labeled "lead-free", "suitable for drinking water", "non-toxic" OR "water potable" o Garden hose to remove the dirty water o Electric drain pump to remove water (We recommend 792 gallon per hour or greater pump.)  o Fish net o Bathing suit top (optional) o Long-handled mirror (optional)  Yourwaterbirth[employer]com sells tubs for ~ $120 if you would rather purchase your own tub  [employer] ([email or web address]) $275 for tub rental/set-up & take down/kit   Things that would prevent you from having a waterbirth:  Premature, <37wks  Previous cesarean birth  Presence of thick meconium-stained fluid  Multiple gestation (Twins, triplets, etc.)  Uncontrolled diabetes  Hypertension  Heavy vaginal bleeding  Non-reassuring fetal heart rate  Active infection (MRSA, etc.)  If your labor has to be induced  Other risk issues identified by your obstetrical provider    

## 2020-06-28 NOTE — Progress Notes (Signed)
Patient sent to lab for her 28 week labs. Armandina Stammer RN

## 2020-06-28 NOTE — Progress Notes (Signed)
   PRENATAL VISIT NOTE  Subjective:  Lindsay Phillips is a 25 y.o. G3P1011 at [redacted]w[redacted]d being seen today for ongoing prenatal care.  She is currently monitored for the following issues for this low-risk pregnancy and has Postpartum care following vaginal delivery; High priority for COVID-19 virus vaccination; Supervision of other normal pregnancy, antepartum; Rh negative state in antepartum period; and Short interval between pregnancies affecting pregnancy, antepartum on their problem list.  Patient reports no complaints.  Contractions: Not present. Vag. Bleeding: None.  Movement: Present. Denies leaking of fluid.   The following portions of the patient's history were reviewed and updated as appropriate: allergies, current medications, past family history, past medical history, past social history, past surgical history and problem list.   Objective:   Vitals:   06/28/20 0838  BP: 110/63  Pulse: 87  Weight: 172 lb (78 kg)    Fetal Status: Fetal Heart Rate (bpm): 140 Fundal Height: 29 cm Movement: Present     General:  Alert, oriented and cooperative. Patient is in no acute distress.  Skin: Skin is warm and dry. No rash noted.   Cardiovascular: Normal heart rate noted  Respiratory: Normal respiratory effort, no problems with respiration noted  Abdomen: Soft, gravid, appropriate for gestational age.  Pain/Pressure: Present     Pelvic: Cervical exam deferred        Extremities: Normal range of motion.  Edema: None  Mental Status: Normal mood and affect. Normal behavior. Normal judgment and thought content.   Assessment and Plan:  Pregnancy: G3P1011 at [redacted]w[redacted]d 1. Supervision of other normal pregnancy, antepartum  - Glucose Tolerance, 2 Hours w/1 Hour - CBC - HIV antibody (with reflex) - RPR - ABO/Rh; Future - Antibody screen  2. Rh negative state in antepartum period      Rhophylac today - ABO/Rh; Future - Antibody screen  3. Short interval between pregnancies affecting pregnancy,  antepartum   4. [redacted] weeks gestation of pregnancy      Brought waterbirth class certificate today      Signed consent       Reviewed waterbirth issues/questions  Asked about Nitrous and other analgesia.. Discussed we are not doing Nitrous now, but if gets Fentanyl would be out of tub until it wears off.  - Glucose Tolerance, 2 Hours w/1 Hour - CBC - HIV antibody (with reflex) - RPR - ABO/Rh; Future - Antibody screen  Preterm labor symptoms and general obstetric precautions including but not limited to vaginal bleeding, contractions, leaking of fluid and fetal movement were reviewed in detail with the patient. Please refer to After Visit Summary for other counseling recommendations.   No follow-ups on file.  Future Appointments  Date Time Provider Department Center  07/12/2020  8:45 AM Aviva Signs, CNM [facility] None  07/26/2020  8:30 AM Sharyon Cable, CNM [facility] None    Wynelle Bourgeois, CNM

## 2020-06-29 LAB — GLUCOSE TOLERANCE, 2 HOURS W/ 1HR
Glucose, 1 hour: 83 mg/dL (ref 65–179)
Glucose, 2 hour: 88 mg/dL (ref 65–152)
Glucose, Fasting: 75 mg/dL (ref 65–91)

## 2020-06-29 LAB — CBC
Hematocrit: 36.8 % (ref 34.0–46.6)
Hemoglobin: 11.7 g/dL (ref 11.1–15.9)
MCH: 28.2 pg (ref 26.6–33.0)
MCHC: 31.8 g/dL (ref 31.5–35.7)
MCV: 89 fL (ref 79–97)
Platelets: 159 10*3/uL (ref 150–450)
RBC: 4.15 x10E6/uL (ref 3.77–5.28)
RDW: 12.3 % (ref 11.7–15.4)
WBC: 9.2 10*3/uL (ref 3.4–10.8)

## 2020-06-29 LAB — RPR: RPR Ser Ql: NONREACTIVE

## 2020-06-29 LAB — ANTIBODY SCREEN: Antibody Screen: NEGATIVE

## 2020-06-29 LAB — HIV ANTIBODY (ROUTINE TESTING W REFLEX): HIV Screen 4th Generation wRfx: NONREACTIVE

## 2020-06-30 ENCOUNTER — Other Ambulatory Visit: Payer: Self-pay | Admitting: Family Medicine

## 2020-06-30 ENCOUNTER — Encounter: Payer: Self-pay | Admitting: Advanced Practice Midwife

## 2020-06-30 DIAGNOSIS — D509 Iron deficiency anemia, unspecified: Secondary | ICD-10-CM

## 2020-06-30 DIAGNOSIS — D649 Anemia, unspecified: Secondary | ICD-10-CM | POA: Insufficient documentation

## 2020-07-06 ENCOUNTER — Encounter (HOSPITAL_COMMUNITY)
Admission: RE | Admit: 2020-07-06 | Discharge: 2020-07-06 | Disposition: A | Discharge status: Home / Self Care | Payer: Medicaid Other | Source: Ambulatory Visit | Attending: Family Medicine | Admitting: Family Medicine

## 2020-07-06 ENCOUNTER — Other Ambulatory Visit: Payer: Self-pay

## 2020-07-06 DIAGNOSIS — D509 Iron deficiency anemia, unspecified: Secondary | ICD-10-CM | POA: Diagnosis not present

## 2020-07-06 DIAGNOSIS — O99013 Anemia complicating pregnancy, third trimester: Secondary | ICD-10-CM | POA: Insufficient documentation

## 2020-07-06 MED ORDER — SODIUM CHLORIDE 0.9 % IV SOLN
300.0000 mg | INTRAVENOUS | Status: DC
  Administered 2020-07-06: 300 mg via INTRAVENOUS
  Filled 2020-07-06: qty 15

## 2020-07-06 NOTE — Discharge Instructions (Signed)

## 2020-07-07 ENCOUNTER — Other Ambulatory Visit: Payer: Self-pay

## 2020-07-07 ENCOUNTER — Telehealth: Payer: Self-pay

## 2020-07-07 ENCOUNTER — Inpatient Hospital Stay (HOSPITAL_COMMUNITY)
Admission: AD | Admit: 2020-07-07 | Discharge: 2020-07-07 | Disposition: A | Discharge status: Home / Self Care | Payer: Medicaid Other | Attending: Family Medicine | Admitting: Family Medicine

## 2020-07-07 DIAGNOSIS — Z3689 Encounter for other specified antenatal screening: Secondary | ICD-10-CM

## 2020-07-07 DIAGNOSIS — O368130 Decreased fetal movements, third trimester, not applicable or unspecified: Secondary | ICD-10-CM

## 2020-07-07 DIAGNOSIS — Z3A30 30 weeks gestation of pregnancy: Secondary | ICD-10-CM | POA: Insufficient documentation

## 2020-07-07 DIAGNOSIS — Z0371 Encounter for suspected problem with amniotic cavity and membrane ruled out: Secondary | ICD-10-CM

## 2020-07-07 LAB — URINALYSIS, ROUTINE W REFLEX MICROSCOPIC
Bilirubin Urine: NEGATIVE
Glucose, UA: 250 mg/dL — AB
Hgb urine dipstick: NEGATIVE
Ketones, ur: NEGATIVE mg/dL
Nitrite: NEGATIVE
Protein, ur: NEGATIVE mg/dL
Specific Gravity, Urine: 1.015 (ref 1.005–1.030)
pH: 7 (ref 5.0–8.0)

## 2020-07-07 LAB — URINALYSIS, MICROSCOPIC (REFLEX)

## 2020-07-07 LAB — POCT FERN TEST: POCT Fern Test: NEGATIVE

## 2020-07-07 NOTE — Discharge Instructions (Signed)
Fetal Movement Counts Patient Name: ________________________________________________ Patient Due Date: ____________________  What is a fetal movement count? A fetal movement count is the number of times that you feel your baby move during a certain amount of time. This may also be called a fetal kick count. A fetal movement count is recommended for every pregnant woman. You may be asked to start counting fetal movements as early as week 28 of your pregnancy. Pay attention to when your baby is most active. You may notice your baby's sleep and wake cycles. You may also notice things that make your baby move more. You should do a fetal movement count:  When your baby is normally most active.  At the same time each day. A good time to count movements is while you are resting, after having something to eat and drink. How do I count fetal movements? 1. Find a quiet, comfortable area. Sit, or lie down on your side. 2. Write down the date, the start time and stop time, and the number of movements that you felt between those two times. Take this information with you to your health care visits. 3. Write down your start time when you feel the first movement. 4. Count kicks, flutters, swishes, rolls, and jabs. You should feel at least 10 movements. 5. You may stop counting after you have felt 10 movements, or if you have been counting for 2 hours. Write down the stop time. 6. If you do not feel 10 movements in 2 hours, contact your health care provider for further instructions. Your health care provider may want to do additional tests to assess your baby's well-being. Contact a health care provider if:  You feel fewer than 10 movements in 2 hours.  Your baby is not moving like he or she usually does. Date: ____________ Start time: ____________ Stop time: ____________ Movements: ____________ Date: ____________ Start time: ____________ Stop time: ____________ Movements: ____________ Date: ____________  Start time: ____________ Stop time: ____________ Movements: ____________ Date: ____________ Start time: ____________ Stop time: ____________ Movements: ____________ Date: ____________ Start time: ____________ Stop time: ____________ Movements: ____________ Date: ____________ Start time: ____________ Stop time: ____________ Movements: ____________ Date: ____________ Start time: ____________ Stop time: ____________ Movements: ____________ Date: ____________ Start time: ____________ Stop time: ____________ Movements: ____________ Date: ____________ Start time: ____________ Stop time: ____________ Movements: ____________ This information is not intended to replace advice given to you by your health care provider. Make sure you discuss any questions you have with your health care provider. Document Revised: 01/01/2019 Document Reviewed: 01/01/2019 Elsevier Patient Education  2021 Elsevier Inc.  

## 2020-07-07 NOTE — MAU Provider Note (Signed)
History     CSN: [id number]  Arrival date and time: 07/07/20 1134   Event Date/Time   First Provider Initiated Contact with Patient 07/07/20 1224      Chief Complaint  Patient presents with  . Decreased Fetal Movement  . Rupture of Membranes   HPI  Lindsay Phillips is a 25 y.o. G3P1011 at [redacted]w[redacted]d who presents with decreased fetal movement & vaginal discharge. Prior to coming to MAU, she had not felt fetal movement since last night. Since being on EFM, reports return of normal movement.  Also noticed some thin discharge this morning. States she's unsure if her water broke. Leaking has not continued. Denies contractions, dysuria, or vaginal bleeding. No recent intercourse.   OB History    Gravida  3   Para  1   Term  1   Preterm      AB  1   Living  1     SAB      IAB  1   Ectopic      Multiple  0   Live Births  1           Past Medical History:  Diagnosis Date  . Medical history non-contributory     Past Surgical History:  Procedure Laterality Date  . INDUCED ABORTION      Family History  Problem Relation Age of Onset  . Healthy Mother   . Healthy Father   . Hypertension Maternal Grandmother     Social History   Tobacco Use  . Smoking status: Never Smoker  . Smokeless tobacco: Never Used  Vaping Use  . Vaping Use: Never used  Substance Use Topics  . Alcohol use: Not Currently    Comment: not in pregnancy  . Drug use: Never    Allergies: No Known Allergies  Medications Prior to Admission  Medication Sig Dispense Refill Last Dose  . Prenatal Vit-Fe Fumarate-FA (PRENATAL VITAMINS PO) Take by mouth.   07/07/2020 at Unknown time    Review of Systems  Constitutional: Negative.   Gastrointestinal: Negative.   Genitourinary: Positive for vaginal discharge. Negative for dysuria and vaginal bleeding.   Physical Exam   Blood pressure 110/65, pulse 91, temperature 98.2 F (36.8 C), temperature source Oral, resp. rate 16, height 5\' 6"  (1.676  m), weight 79.6 kg, last menstrual period 12/08/2019, SpO2 100 %, currently breastfeeding.  Physical Exam Vitals and nursing note reviewed. Exam conducted with a chaperone present.  Constitutional:      General: She is not in acute distress.    Appearance: Normal appearance.  HENT:     Head: Normocephalic and atraumatic.  Pulmonary:     Effort: Pulmonary effort is normal. No respiratory distress.  Abdominal:     Tenderness: There is no abdominal tenderness.  Genitourinary:    General: Normal vulva.     Exam position: Lithotomy position.     Comments: No pooling of fluid or abnormal discharge. No blood. Cervix visually closed.  Neurological:     Mental Status: She is alert.    NST:  Baseline: 135 bpm, Variability: Good {> 6 bpm), Accelerations: Reactive and Decelerations: Absent  MAU Course  Procedures Results for orders placed or performed during the hospital encounter of 07/07/20 (from the past 24 hour(s))  Urinalysis, Routine w reflex microscopic Urine, Clean Catch     Status: Abnormal   Collection Time: 07/07/20 12:15 PM  Result Value Ref Range   Color, Urine YELLOW YELLOW   APPearance CLEAR CLEAR  Specific Gravity, Urine 1.015 1.005 - 1.030   pH 7.0 5.0 - 8.0   Glucose, UA 250 (A) NEGATIVE mg/dL   Hgb urine dipstick NEGATIVE NEGATIVE   Bilirubin Urine NEGATIVE NEGATIVE   Ketones, ur NEGATIVE NEGATIVE mg/dL   Protein, ur NEGATIVE NEGATIVE mg/dL   Nitrite NEGATIVE NEGATIVE   Leukocytes,Ua SMALL (A) NEGATIVE  Urinalysis, Microscopic (reflex)     Status: Abnormal   Collection Time: 07/07/20 12:15 PM  Result Value Ref Range   RBC / HPF 0-5 0 - 5 RBC/hpf   WBC, UA 0-5 0 - 5 WBC/hpf   Bacteria, UA RARE (A) NONE SEEN   Squamous Epithelial / LPF 0-5 0 - 5   Mucus PRESENT   POCT fern test     Status: Normal   Collection Time: 07/07/20 12:45 PM  Result Value Ref Range   POCT Fern Test Negative = intact amniotic membranes     MDM Reactive NST with movement heard  through monitor. Patient reports good movement since arriving to MAU.   Sterile speculum exam performed due to complaint of vaginal leaking. Normal discharge seen; no pooling of fluid. Fern slide negative.   Assessment and Plan   1. Decreased fetal movements in third trimester, single or unspecified fetus   2. Encounter for suspected PROM, with rupture of membranes not found   3. [redacted] weeks gestation of pregnancy   4. NST (non-stress test) reactive    -reviewed reasons to return to MAU -urine culture pending  Judeth Horn 07/07/2020, 12:24 PM

## 2020-07-07 NOTE — Telephone Encounter (Signed)
Pt called stating she has only felt baby move once since yesterday. Pt states she drank cold water and but baby still hasn't moved. Advised pt to go to [facility] to be seen. Understanding was voiced. Jahmir Salo l Demetrio Leighty, CMA

## 2020-07-07 NOTE — MAU Note (Signed)
Patient came into MAU with c/o decreased fetal movement at [redacted]w[redacted]d gestation. Patient reports no fetal movement since yesterday. Patient reports intermittent lower left back pain. Patient denies any vaginal bleeding, patient does report having clear fluid on her underwear this morning, unsure if she SROM.

## 2020-07-08 LAB — CULTURE, OB URINE: Culture: NO GROWTH

## 2020-07-12 ENCOUNTER — Ambulatory Visit (INDEPENDENT_AMBULATORY_CARE_PROVIDER_SITE_OTHER): Payer: Medicaid Other | Admitting: Advanced Practice Midwife

## 2020-07-12 ENCOUNTER — Encounter: Payer: Self-pay | Admitting: Advanced Practice Midwife

## 2020-07-12 ENCOUNTER — Other Ambulatory Visit: Payer: Self-pay

## 2020-07-12 VITALS — BP 113/63 | HR 99 | Wt 173.0 lb

## 2020-07-12 DIAGNOSIS — Z3A31 31 weeks gestation of pregnancy: Secondary | ICD-10-CM

## 2020-07-12 NOTE — Patient Instructions (Signed)

## 2020-07-12 NOTE — Progress Notes (Signed)
   PRENATAL VISIT NOTE  Subjective:  Lindsay Phillips is a 25 y.o. G3P1011 at [redacted]w[redacted]d being seen today for ongoing prenatal care.  She is currently monitored for the following issues for this low-risk pregnancy and has Postpartum care following vaginal delivery; High priority for COVID-19 virus vaccination; Supervision of other normal pregnancy, antepartum; Rh negative state in antepartum period; Short interval between pregnancies affecting pregnancy, antepartum; and Anemia on their problem list.  Patient reports no complaints.  Contractions: Not present. Vag. Bleeding: None.  Movement: Present. Denies leaking of fluid.   The following portions of the patient's history were reviewed and updated as appropriate: allergies, current medications, past family history, past medical history, past social history, past surgical history and problem list.   Objective:   Vitals:   07/12/20 0854  BP: 113/63  Pulse: 99  Weight: 173 lb (78.5 kg)    Fetal Status: Fetal Heart Rate (bpm): 140   Movement: Present     General:  Alert, oriented and cooperative. Patient is in no acute distress.  Skin: Skin is warm and dry. No rash noted.   Cardiovascular: Normal heart rate noted  Respiratory: Normal respiratory effort, no problems with respiration noted  Abdomen: Soft, gravid, appropriate for gestational age.  Pain/Pressure: Present     Pelvic: Cervical exam deferred        Extremities: Normal range of motion.  Edema: None  Mental Status: Normal mood and affect. Normal behavior. Normal judgment and thought content.   Assessment and Plan:  Pregnancy: G3P1011 at [redacted]w[redacted]d 1. [redacted] weeks gestation of pregnancy     Documented covid vaccines from 2021     Discussed fetal movement     Still planning waterbirth     Glucola within normal limits  Preterm labor symptoms and general obstetric precautions including but not limited to vaginal bleeding, contractions, leaking of fluid and fetal movement were reviewed in detail  with the patient. Please refer to After Visit Summary for other counseling recommendations.   Return in about 2 weeks (around 07/26/2020) for [facility].  Future Appointments  Date Time Provider Department Center  07/13/2020 10:00 AM [id number] None  07/26/2020  8:30 AM Sharyon Cable, CNM [facility] None    Wynelle Bourgeois, CNM

## 2020-07-13 ENCOUNTER — Encounter (HOSPITAL_COMMUNITY)
Admission: RE | Admit: 2020-07-13 | Discharge: 2020-07-13 | Disposition: A | Discharge status: Home / Self Care | Payer: Medicaid Other | Source: Ambulatory Visit | Attending: Family Medicine | Admitting: Family Medicine

## 2020-07-13 DIAGNOSIS — O99013 Anemia complicating pregnancy, third trimester: Secondary | ICD-10-CM | POA: Diagnosis not present

## 2020-07-13 DIAGNOSIS — D509 Iron deficiency anemia, unspecified: Secondary | ICD-10-CM

## 2020-07-13 MED ORDER — SODIUM CHLORIDE 0.9 % IV SOLN
300.0000 mg | INTRAVENOUS | Status: DC
  Administered 2020-07-13: 300 mg via INTRAVENOUS
  Filled 2020-07-13: qty 15

## 2020-07-20 ENCOUNTER — Inpatient Hospital Stay (HOSPITAL_COMMUNITY): Admission: RE | Admit: 2020-07-20 | Payer: Medicaid Other | Source: Ambulatory Visit

## 2020-07-26 ENCOUNTER — Encounter: Payer: Medicaid Other | Admitting: Certified Nurse Midwife

## 2020-07-28 ENCOUNTER — Other Ambulatory Visit: Payer: Self-pay

## 2020-07-28 ENCOUNTER — Ambulatory Visit (INDEPENDENT_AMBULATORY_CARE_PROVIDER_SITE_OTHER): Payer: Medicaid Other | Admitting: Family Medicine

## 2020-07-28 VITALS — BP 96/55 | HR 81 | Wt 175.0 lb

## 2020-07-28 DIAGNOSIS — Z3A33 33 weeks gestation of pregnancy: Secondary | ICD-10-CM | POA: Diagnosis not present

## 2020-07-28 DIAGNOSIS — Z23 Encounter for immunization: Secondary | ICD-10-CM | POA: Diagnosis not present

## 2020-07-28 DIAGNOSIS — O99891 Other specified diseases and conditions complicating pregnancy: Secondary | ICD-10-CM

## 2020-07-28 DIAGNOSIS — Z3480 Encounter for supervision of other normal pregnancy, unspecified trimester: Secondary | ICD-10-CM

## 2020-07-28 DIAGNOSIS — M549 Dorsalgia, unspecified: Secondary | ICD-10-CM

## 2020-07-28 DIAGNOSIS — Z6791 Unspecified blood type, Rh negative: Secondary | ICD-10-CM

## 2020-07-28 DIAGNOSIS — O26899 Other specified pregnancy related conditions, unspecified trimester: Secondary | ICD-10-CM

## 2020-07-28 DIAGNOSIS — M9903 Segmental and somatic dysfunction of lumbar region: Secondary | ICD-10-CM

## 2020-07-28 NOTE — Progress Notes (Signed)
   PRENATAL VISIT NOTE  Subjective:  Lindsay Phillips is a 25 y.o. G3P1011 at [redacted]w[redacted]d being seen today for ongoing prenatal care.  She is currently monitored for the following issues for this low-risk pregnancy and has Postpartum care following vaginal delivery; High priority for COVID-19 virus vaccination; Supervision of other normal pregnancy, antepartum; Rh negative state in antepartum period; Short interval between pregnancies affecting pregnancy, antepartum; and Anemia on their problem list.  Patient reports backache - on right side, radiates down leg. Stretching minimally helpful. Contractions: Irregular. Vag. Bleeding: None.  Movement: Present. Denies leaking of fluid.   The following portions of the patient's history were reviewed and updated as appropriate: allergies, current medications, past family history, past medical history, past social history, past surgical history and problem list. Problem list updated.  Objective:   Vitals:   07/28/20 1439  BP: (!) 96/55  Pulse: 81  Weight: 175 lb (79.4 kg)    Fetal Status: Fetal Heart Rate (bpm): 137   Movement: Present     General:  Alert, oriented and cooperative. Patient is in no acute distress.  Skin: Skin is warm and dry. No rash noted.   Cardiovascular: Normal heart rate noted  Respiratory: Normal respiratory effort, no problems with respiration noted  Abdomen: Soft, gravid, appropriate for gestational age. Pain/Pressure: Present     Pelvic:  Cervical exam deferred        MSK: Restriction, tenderness, tissue texture changes, and paraspinal spasm in the right lumbar spine  Neuro: Moves all four extremities with no focal neurological deficit  Extremities: Normal range of motion.  Edema: None  Mental Status: Normal mood and affect. Normal behavior. Normal judgment and thought content.   OSE: Head   Cervical   Thoracic   Rib   Lumbar L5 ESRR, L1 ESRL  Sacrum   Pelvis     Assessment and Plan:  Pregnancy: G3P1011 at  [redacted]w[redacted]d  1. [redacted] weeks gestation of pregnancy - Tdap vaccine greater than or equal to 7yo IM  2. Supervision of other normal pregnancy, antepartum FHT and FH normal  3. Back pain affecting pregnancy in third trimester 4. Somatic dysfunction of lumbar region 5. Rh negative state in antepartum period OMT done after patient permission. HVLA technique utilized. 1 areas treated with improvement of tissue texture and joint mobility. Patient tolerated procedure well.    Preterm labor symptoms and general obstetric precautions including but not limited to vaginal bleeding, contractions, leaking of fluid and fetal movement were reviewed in detail with the patient. Please refer to After Visit Summary for other counseling recommendations.  No follow-ups on file.  Levie Heritage, DO

## 2020-08-04 ENCOUNTER — Encounter: Payer: Medicaid Other | Admitting: Family Medicine

## 2020-08-16 ENCOUNTER — Other Ambulatory Visit: Payer: Self-pay

## 2020-08-16 ENCOUNTER — Encounter: Payer: Self-pay | Admitting: Advanced Practice Midwife

## 2020-08-16 ENCOUNTER — Ambulatory Visit (INDEPENDENT_AMBULATORY_CARE_PROVIDER_SITE_OTHER): Payer: Medicaid Other | Admitting: Advanced Practice Midwife

## 2020-08-16 ENCOUNTER — Other Ambulatory Visit (HOSPITAL_COMMUNITY)
Admission: RE | Admit: 2020-08-16 | Discharge: 2020-08-16 | Disposition: A | Discharge status: Home / Self Care | Payer: Medicaid Other | Source: Ambulatory Visit | Attending: Advanced Practice Midwife | Admitting: Advanced Practice Midwife

## 2020-08-16 VITALS — BP 104/63 | HR 86 | Wt 180.0 lb

## 2020-08-16 DIAGNOSIS — Z6791 Unspecified blood type, Rh negative: Secondary | ICD-10-CM | POA: Insufficient documentation

## 2020-08-16 DIAGNOSIS — O26899 Other specified pregnancy related conditions, unspecified trimester: Secondary | ICD-10-CM

## 2020-08-16 DIAGNOSIS — M549 Dorsalgia, unspecified: Secondary | ICD-10-CM | POA: Diagnosis not present

## 2020-08-16 DIAGNOSIS — O26893 Other specified pregnancy related conditions, third trimester: Secondary | ICD-10-CM | POA: Diagnosis not present

## 2020-08-16 DIAGNOSIS — Z3A36 36 weeks gestation of pregnancy: Secondary | ICD-10-CM

## 2020-08-16 DIAGNOSIS — O99891 Other specified diseases and conditions complicating pregnancy: Secondary | ICD-10-CM

## 2020-08-16 NOTE — Progress Notes (Signed)
   PRENATAL VISIT NOTE  Subjective:  Lindsay Phillips is a 25 y.o. G3P1011 at [redacted]w[redacted]d being seen today for ongoing prenatal care.  She is currently monitored for the following issues for this low-risk pregnancy and has High priority for COVID-19 virus vaccination; Supervision of other normal pregnancy, antepartum; Rh negative state in antepartum period; and Short interval between pregnancies affecting pregnancy, antepartum on their problem list.  Patient reports occasional contractions.  Contractions: Irregular. Vag. Bleeding: None.  Movement: Present. Denies leaking of fluid.   The following portions of the patient's history were reviewed and updated as appropriate: allergies, current medications, past family history, past medical history, past social history, past surgical history and problem list.   Objective:   Vitals:   08/16/20 0851  BP: 104/63  Pulse: 86  Weight: 180 lb (81.6 kg)    Fetal Status: Fetal Heart Rate (bpm): 143   Movement: Present     General:  Alert, oriented and cooperative. Patient is in no acute distress.  Skin: Skin is warm and dry. No rash noted.   Cardiovascular: Normal heart rate noted  Respiratory: Normal respiratory effort, no problems with respiration noted  Abdomen: Soft, gravid, appropriate for gestational age.  Pain/Pressure: Present     Pelvic: Cervical exam performed in the presence of a chaperone      Cervix is Fingertip/30%/ballotable/vertex   Extremities: Normal range of motion.  Edema: None  Mental Status: Normal mood and affect. Normal behavior. Normal judgment and thought content.   Assessment and Plan:  Pregnancy: G3P1011 at [redacted]w[redacted]d 1. [redacted] weeks gestation of pregnancy  - GC/Chlamydia probe amp ([facility])not at [facility] - Culture, beta strep (group b only)  2. Rh negative, antepartum     Rhophylac PP      Done on 06/28/20  3. Back pain affecting pregnancy in third trimester     Feels better after Dr Denyce Robert treatment  Preterm labor  symptoms and general obstetric precautions including but not limited to vaginal bleeding, contractions, leaking of fluid and fetal movement were reviewed in detail with the patient. Please refer to After Visit Summary for other counseling recommendations.   Return in about 1 week (around 08/23/2020) for [facility].  Future Appointments  Date Time Provider Department Center  08/23/2020  8:15 AM Aviva Signs, CNM [facility] None  08/30/2020  8:15 AM Rasch, Harolyn Rutherford, NP [facility] None  09/06/2020  8:15 AM Aviva Signs, CNM [facility] None    Wynelle Bourgeois, CNM

## 2020-08-16 NOTE — Patient Instructions (Signed)

## 2020-08-17 LAB — GC/CHLAMYDIA PROBE AMP (~~LOC~~) NOT AT ARMC
Chlamydia: NEGATIVE
Comment: NEGATIVE
Comment: NORMAL
Neisseria Gonorrhea: NEGATIVE

## 2020-08-23 ENCOUNTER — Telehealth: Payer: Self-pay

## 2020-08-23 ENCOUNTER — Ambulatory Visit (INDEPENDENT_AMBULATORY_CARE_PROVIDER_SITE_OTHER): Payer: Medicaid Other | Admitting: Advanced Practice Midwife

## 2020-08-23 ENCOUNTER — Other Ambulatory Visit: Payer: Self-pay

## 2020-08-23 ENCOUNTER — Encounter: Payer: Self-pay | Admitting: Advanced Practice Midwife

## 2020-08-23 VITALS — BP 109/62 | HR 96 | Wt 184.0 lb

## 2020-08-23 DIAGNOSIS — O26899 Other specified pregnancy related conditions, unspecified trimester: Secondary | ICD-10-CM

## 2020-08-23 DIAGNOSIS — Z3480 Encounter for supervision of other normal pregnancy, unspecified trimester: Secondary | ICD-10-CM

## 2020-08-23 DIAGNOSIS — O09899 Supervision of other high risk pregnancies, unspecified trimester: Secondary | ICD-10-CM

## 2020-08-23 DIAGNOSIS — Z3A37 37 weeks gestation of pregnancy: Secondary | ICD-10-CM

## 2020-08-23 DIAGNOSIS — Z6791 Unspecified blood type, Rh negative: Secondary | ICD-10-CM

## 2020-08-23 NOTE — Telephone Encounter (Signed)
Called [facility] to get GBS results. [facility] rep states they did not have pt's GBS swab. GBS swab will be repeated today.  Abrey Bradway l Leeza Heiner, CMA

## 2020-08-23 NOTE — Progress Notes (Signed)
GBS culture repeated because [facility] says they never got sample. Armandina Stammer RN

## 2020-08-23 NOTE — Progress Notes (Signed)
   PRENATAL VISIT NOTE  Subjective:  Lindsay Phillips is a 25 y.o. G3P1011 at [redacted]w[redacted]d being seen today for ongoing prenatal care.  She is currently monitored for the following issues for this low-risk pregnancy and has High priority for COVID-19 virus vaccination; Supervision of other normal pregnancy, antepartum; Rh negative state in antepartum period; and Short interval between pregnancies affecting pregnancy, antepartum on their problem list.  Patient reports occasional contractions.  Contractions: Irregular. Vag. Bleeding: None.  Movement: Present. Denies leaking of fluid.   The following portions of the patient's history were reviewed and updated as appropriate: allergies, current medications, past family history, past medical history, past social history, past surgical history and problem list.   Objective:   Vitals:   08/23/20 0848  BP: 109/62  Pulse: 96  Weight: 184 lb (83.5 kg)    Fetal Status: Fetal Heart Rate (bpm): 130 Fundal Height: 37 cm Movement: Present  Presentation: Vertex  General:  Alert, oriented and cooperative. Patient is in no acute distress.  Skin: Skin is warm and dry. No rash noted.   Cardiovascular: Normal heart rate noted  Respiratory: Normal respiratory effort, no problems with respiration noted  Abdomen: Soft, gravid, appropriate for gestational age.  Pain/Pressure: Present     Pelvic: Cervical exam performed in the presence of a chaperone Dilation: Fingertip Effacement (%): 40 Station: -3  Extremities: Normal range of motion.  Edema: None  Mental Status: Normal mood and affect. Normal behavior. Normal judgment and thought content.   Assessment and Plan:  Pregnancy: G3P1011 at [redacted]w[redacted]d 1. Supervision of other normal pregnancy, antepartum Repeated GBS, lab lost specimen - Culture, beta strep (group b only)  2. Rh negative state in antepartum period      Given 06/28/20      RhIg PP  3. Short interval between pregnancies affecting pregnancy,  antepartum   4. [redacted] weeks gestation of pregnancy     Labor precautions reviewed - Culture, beta strep (group b only)  Term labor symptoms and general obstetric precautions including but not limited to vaginal bleeding, contractions, leaking of fluid and fetal movement were reviewed in detail with the patient. Please refer to After Visit Summary for other counseling recommendations.   Return in about 1 week (around 08/30/2020) for [facility].  Future Appointments  Date Time Provider Department Center  08/30/2020  8:15 AM Rasch, Harolyn Rutherford, NP [facility] None  09/06/2020  8:15 AM Aviva Signs, CNM [facility] None    Wynelle Bourgeois, CNM

## 2020-08-23 NOTE — Patient Instructions (Signed)
Labor and Vaginal Delivery  Vaginal delivery means that you give birth by pushing your baby out of your birth canal (vagina). A team of health care providers will help you before, during, and after vaginal delivery. Birth experiences are unique for every woman and every pregnancy, and birth experiences vary depending on where you choose to give birth. What happens when I arrive at the birth center or hospital? Once you are in labor and have been admitted into the hospital or birth center, your health care provider may:  Review your pregnancy history and any concerns that you have.  Insert an IV into one of your veins. This may be used to give you fluids and medicines.  Check your blood pressure, pulse, temperature, and heart rate (vital signs).  Check whether your bag of water (amniotic sac) has broken (ruptured).  Talk with you about your birth plan and discuss pain control options. Monitoring Your health care provider may monitor your contractions (uterine monitoring) and your baby's heart rate (fetal monitoring). You may need to be monitored:  Often, but not continuously (intermittently).  All the time or for long periods at a time (continuously). Continuous monitoring may be needed if: ? You are taking certain medicines, such as medicine to relieve pain or make your contractions stronger. ? You have pregnancy or labor complications. Monitoring may be done by:  Placing a special stethoscope or a handheld monitoring device on your abdomen to check your baby's heartbeat and to check for contractions.  Placing monitors on your abdomen (external monitors) to record your baby's heartbeat and the frequency and length of contractions.  Placing monitors inside your uterus through your vagina (internal monitors) to record your baby's heartbeat and the frequency, length, and strength of your contractions. Depending on the type of monitor, it may remain in your uterus or on your baby's head  until birth.  Telemetry. This is a type of continuous monitoring that can be done with external or internal monitors. Instead of having to stay in bed, you are able to move around during telemetry. Physical exam Your health care provider may perform frequent physical exams. This may include:  Checking how and where your baby is positioned in your uterus.  Checking your cervix to determine: ? Whether it is thinning out (effacing). ? Whether it is opening up (dilating). What happens during labor and delivery? Normal labor and delivery is divided into the following three stages: Stage 1  This is the longest stage of labor.  This stage can last for hours or days.  Throughout this stage, you will feel contractions. Contractions generally feel mild, infrequent, and irregular at first. They get stronger, more frequent (about every 2-3 minutes), and more regular as you move through this stage.  This stage ends when your cervix is completely dilated to 4 inches (10 cm) and completely effaced. Stage 2  This stage starts once your cervix is completely effaced and dilated and lasts until the delivery of your baby.  This stage may last from 20 minutes to 2 hours.  This is the stage where you will feel an urge to push your baby out of your vagina.  You may feel stretching and burning pain, especially when the widest part of your baby's head passes through the vaginal opening (crowning).  Once your baby is delivered, the umbilical cord will be clamped and cut. This usually occurs after waiting a period of 1-2 minutes after delivery.  Your baby will be placed on your bare  chest (skin-to-skin contact) in an upright position and covered with a warm blanket. Watch your baby for feeding cues, like rooting or sucking, and help the baby to your breast for his or her first feeding. Stage 3  This stage starts immediately after the birth of your baby and ends after you deliver the placenta.  This stage  may take anywhere from 5 to 30 minutes.  After your baby has been delivered, you will feel contractions as your body expels the placenta and your uterus contracts to control bleeding.   What can I expect after labor and delivery?  After labor is over, you and your baby will be monitored closely until you are ready to go home to ensure that you are both healthy. Your health care team will teach you how to care for yourself and your baby.  You and your baby will stay in the same room (rooming in) during your hospital stay. This will encourage early bonding and successful breastfeeding.  You may continue to receive fluids and medicines through an IV.  Your uterus will be checked and massaged regularly (fundal massage).  You will have some soreness and pain in your abdomen, vagina, and the area of skin between your vaginal opening and your anus (perineum).  If an incision was made near your vagina (episiotomy) or if you had some vaginal tearing during delivery, cold compresses may be placed on your episiotomy or your tear. This helps to reduce pain and swelling.  You may be given a squirt bottle to use instead of wiping when you go to the bathroom. To use the squirt bottle, follow these steps: ? Before you urinate, fill the squirt bottle with warm water. Do not use hot water. ? After you urinate, while you are sitting on the toilet, use the squirt bottle to rinse the area around your urethra and vaginal opening. This rinses away any urine and blood. ? Fill the squirt bottle with clean water every time you use the bathroom.  It is normal to have vaginal bleeding after delivery. Wear a sanitary pad for vaginal bleeding and discharge. Summary  Vaginal delivery means that you will give birth by pushing your baby out of your birth canal (vagina).  Your health care provider may monitor your contractions (uterine monitoring) and your baby's heart rate (fetal monitoring).  Your health care provider  may perform a physical exam.  Normal labor and delivery is divided into three stages.  After labor is over, you and your baby will be monitored closely until you are ready to go home. This information is not intended to replace advice given to you by your health care provider. Make sure you discuss any questions you have with your health care provider. Document Revised: 06/18/2017 Document Reviewed: 06/18/2017 Elsevier Patient Education  2021 [employer].

## 2020-08-24 DIAGNOSIS — Z20822 Contact with and (suspected) exposure to covid-19: Secondary | ICD-10-CM | POA: Diagnosis not present

## 2020-08-27 LAB — CULTURE, BETA STREP (GROUP B ONLY): Strep Gp B Culture: NEGATIVE

## 2020-08-30 ENCOUNTER — Other Ambulatory Visit: Payer: Self-pay

## 2020-08-30 ENCOUNTER — Ambulatory Visit (INDEPENDENT_AMBULATORY_CARE_PROVIDER_SITE_OTHER): Payer: Medicaid Other | Admitting: Obstetrics and Gynecology

## 2020-08-30 DIAGNOSIS — Z3480 Encounter for supervision of other normal pregnancy, unspecified trimester: Secondary | ICD-10-CM

## 2020-08-30 NOTE — Progress Notes (Signed)
   PRENATAL VISIT NOTE  Subjective:  Lindsay Phillips is a 25 y.o. G3P1011 at [redacted]w[redacted]d being seen today for ongoing prenatal care.  She is currently monitored for the following issues for this low-risk pregnancy and has High priority for COVID-19 virus vaccination; Supervision of other normal pregnancy, antepartum; Rh negative state in antepartum period; and Short interval between pregnancies affecting pregnancy, antepartum on their problem list.  Patient reports no complaints.  Contractions: Irregular. Vag. Bleeding: None.  Movement: Present. Denies leaking of fluid.   The following portions of the patient's history were reviewed and updated as appropriate: allergies, current medications, past family history, past medical history, past social history, past surgical history and problem list.   Objective:   Vitals:   08/30/20 0818  BP: 107/60  Pulse: 79  Weight: 189 lb (85.7 kg)    Fetal Status: Fetal Heart Rate (bpm): 138 Fundal Height: 37 cm Movement: Present     General:  Alert, oriented and cooperative. Patient is in no acute distress.  Skin: Skin is warm and dry. No rash noted.   Cardiovascular: Normal heart rate noted  Respiratory: Normal respiratory effort, no problems with respiration noted  Abdomen: Soft, gravid, appropriate for gestational age.  Pain/Pressure: Present     Pelvic: Cervical exam deferred        Extremities: Normal range of motion.  Edema: None  Mental Status: Normal mood and affect. Normal behavior. Normal judgment and thought content.   Assessment and Plan:  Pregnancy: G3P1011 at [redacted]w[redacted]d 1. Supervision of other normal pregnancy, antepartum  Doing well Reviewed labor precautions GBS negative.    Term labor symptoms and general obstetric precautions including but not limited to vaginal bleeding, contractions, leaking of fluid and fetal movement were reviewed in detail with the patient. Please refer to After Visit Summary for other counseling recommendations.    No follow-ups on file.  Future Appointments  Date Time Provider Department Center  09/06/2020  8:15 AM Aviva Signs, CNM [facility] None    Venia Carbon, NP

## 2020-09-06 ENCOUNTER — Ambulatory Visit (INDEPENDENT_AMBULATORY_CARE_PROVIDER_SITE_OTHER): Payer: Medicaid Other | Admitting: Advanced Practice Midwife

## 2020-09-06 ENCOUNTER — Encounter: Payer: Self-pay | Admitting: Advanced Practice Midwife

## 2020-09-06 ENCOUNTER — Other Ambulatory Visit: Payer: Self-pay

## 2020-09-06 VITALS — BP 113/74 | HR 97 | Wt 184.0 lb

## 2020-09-06 DIAGNOSIS — O99013 Anemia complicating pregnancy, third trimester: Secondary | ICD-10-CM

## 2020-09-06 DIAGNOSIS — Z3A39 39 weeks gestation of pregnancy: Secondary | ICD-10-CM

## 2020-09-06 DIAGNOSIS — O26899 Other specified pregnancy related conditions, unspecified trimester: Secondary | ICD-10-CM

## 2020-09-06 DIAGNOSIS — D509 Iron deficiency anemia, unspecified: Secondary | ICD-10-CM

## 2020-09-06 DIAGNOSIS — Z6791 Unspecified blood type, Rh negative: Secondary | ICD-10-CM

## 2020-09-06 NOTE — Patient Instructions (Signed)

## 2020-09-06 NOTE — Progress Notes (Signed)
   PRENATAL VISIT NOTE  Subjective:  Lindsay Phillips is a 25 y.o. G3P1011 at [redacted]w[redacted]d being seen today for ongoing prenatal care.  She is currently monitored for the following issues for this low-risk pregnancy and has High priority for COVID-19 virus vaccination; Supervision of other normal pregnancy, antepartum; Rh negative state in antepartum period; and Short interval between pregnancies affecting pregnancy, antepartum on their problem list.  Patient reports no complaints.  Contractions: Irregular. Vag. Bleeding: None.  Movement: Present. Denies leaking of fluid.   The following portions of the patient's history were reviewed and updated as appropriate: allergies, current medications, past family history, past medical history, past social history, past surgical history and problem list.   Objective:   Vitals:   09/06/20 0813  BP: 113/74  Pulse: 97  Weight: 184 lb (83.5 kg)    Fetal Status:     Movement: Present     General:  Alert, oriented and cooperative. Patient is in no acute distress.  Skin: Skin is warm and dry. No rash noted.   Cardiovascular: Normal heart rate noted  Respiratory: Normal respiratory effort, no problems with respiration noted  Abdomen: Soft, gravid, appropriate for gestational age.  Pain/Pressure: Present     Pelvic: Cervical exam performed in the presence of a chaperone      Cervix is a tight 1cm/40%/soft/ballotable/vertex   Extremities: Normal range of motion.  Edema: None  Mental Status: Normal mood and affect. Normal behavior. Normal judgment and thought content.   Assessment and Plan:  Pregnancy: G3P1011 at [redacted]w[redacted]d 1. [redacted] weeks gestation of pregnancy Reviewed signs of labor Discussed waterbirth tubs are temp out of service while filters installed  2. Rh negative state in antepartum period Had RhIg on 06/28/20 Give PP  3. Maternal iron deficiency anemia affecting pregnancy in third trimester, antepartum Hgb 11.7 in Feb  Term labor symptoms and general  obstetric precautions including but not limited to vaginal bleeding, contractions, leaking of fluid and fetal movement were reviewed in detail with the patient. Please refer to After Visit Summary for other counseling recommendations.   Return in about 1 week (around 09/13/2020) for [facility].  Future Appointments  Date Time Provider Department Center  09/13/2020  8:15 AM Aviva Signs, CNM [facility] None    Wynelle Bourgeois, CNM

## 2020-09-13 ENCOUNTER — Encounter: Payer: Self-pay | Admitting: Advanced Practice Midwife

## 2020-09-13 ENCOUNTER — Telehealth (HOSPITAL_COMMUNITY): Payer: Self-pay | Admitting: *Deleted

## 2020-09-13 ENCOUNTER — Encounter (HOSPITAL_COMMUNITY): Payer: Self-pay | Admitting: *Deleted

## 2020-09-13 ENCOUNTER — Ambulatory Visit (INDEPENDENT_AMBULATORY_CARE_PROVIDER_SITE_OTHER): Payer: Medicaid Other | Admitting: Advanced Practice Midwife

## 2020-09-13 ENCOUNTER — Other Ambulatory Visit: Payer: Self-pay

## 2020-09-13 VITALS — BP 122/77 | HR 94 | Wt 184.0 lb

## 2020-09-13 DIAGNOSIS — O09899 Supervision of other high risk pregnancies, unspecified trimester: Secondary | ICD-10-CM

## 2020-09-13 DIAGNOSIS — O26899 Other specified pregnancy related conditions, unspecified trimester: Secondary | ICD-10-CM

## 2020-09-13 DIAGNOSIS — Z6791 Unspecified blood type, Rh negative: Secondary | ICD-10-CM

## 2020-09-13 DIAGNOSIS — Z3480 Encounter for supervision of other normal pregnancy, unspecified trimester: Secondary | ICD-10-CM

## 2020-09-13 NOTE — Patient Instructions (Signed)
Labor Induction Labor induction is when steps are taken to cause a pregnant woman to begin the labor process. Most women go into labor on their own between 37 weeks and 42 weeks of pregnancy. When this does not happen, or when there is a medical need for labor to begin, steps may be taken to induce, or bring on, labor. Labor induction causes a pregnant woman's uterus to contract. It also causes the cervix to soften (ripen), open (dilate), and thin out. Usually, labor is not induced before 39 weeks of pregnancy unless there is a medical reason to do so. When is labor induction considered? Labor induction may be right for you if:  Your pregnancy lasts longer than 41 to 42 weeks.  Your placenta is separating from your uterus (placental abruption).  You have a rupture of membranes and your labor does not begin.  You have health problems, like diabetes or high blood pressure (preeclampsia) during your pregnancy.  Your baby has stopped growing or does not have enough amniotic fluid. Before labor induction begins, your health care provider will consider the following factors:  Your medical condition and the baby's condition.  How many weeks you have been pregnant.  How mature the baby's lungs are.  The condition of your cervix.  The position of the baby.  The size of your birth canal. Tell a health care provider about:  Any allergies you have.  All medicines you are taking, including vitamins, herbs, eye drops, creams, and over-the-counter medicines.  Any problems you or your family members have had with anesthetic medicines.  Any surgeries you have had.  Any blood disorders you have.  Any medical conditions you have. What are the risks? Generally, this is a safe procedure. However, problems may occur, including:  Failed induction.  Changes in fetal heart rate, such as being too high, too low, or irregular (erratic).  Infection in the mother or the baby.  Increased risk of  having a cesarean delivery.  Breaking off (abruption) of the placenta from the uterus. This is rare.  Rupture of the uterus. This is very rare.  Your baby could fail to get enough blood flow or oxygen. This can be life-threatening. When induction is needed for medical reasons, the benefits generally outweigh the risks. What happens during the procedure? During the procedure, your health care provider will use one of these methods to induce labor:  Stripping the membranes. In this method, the amniotic sac tissue is gently separated from the cervix. This causes the following to happen: ? Your cervix stretches, which in turn causes the release of prostaglandins. ? Prostaglandins induce labor and cause the uterus to contract. ? This procedure is often done in an office visit. You will be sent home to wait for contractions to begin.  Prostaglandin medicine. This medicine starts contractions and causes the cervix to dilate and ripen. This can be taken by mouth (orally) or by being inserted into the vagina (suppository).  Inserting a small, thin tube (catheter) with a balloon into the vagina and then expanding the balloon with water to dilate the cervix.  Breaking the water. In this method, a small instrument is used to make a small hole in the amniotic sac. This eventually causes the amniotic sac to break. Contractions should begin within a few hours.  Medicine to trigger or strengthen contractions. This medicine is given through an IV that is inserted into a vein in your arm. This procedure may vary among health care providers and hospitals.     Where to find more information  [employer] of [employer]: www.marchofdimes.org  The American College of Obstetricians and Gynecologists: www.acog.org Summary  Labor induction causes a pregnant woman's uterus to contract. It also causes the cervix to soften (ripen), open (dilate), and thin out.  Labor is usually not induced before 39 weeks of pregnancy unless  there is a medical reason to do so.  When induction is needed for medical reasons, the benefits generally outweigh the risks.  Talk with your health care provider about which methods of labor induction are right for you. This information is not intended to replace advice given to you by your health care provider. Make sure you discuss any questions you have with your health care provider. Document Revised: 02/25/2020 Document Reviewed: 02/25/2020 Elsevier Patient Education  2021 Elsevier Inc.  

## 2020-09-13 NOTE — Progress Notes (Signed)
   PRENATAL VISIT NOTE  Subjective:  Lindsay Phillips is a 25 y.o. G3P1011 at [redacted]w[redacted]d being seen today for ongoing prenatal care.  She is currently monitored for the following issues for this low-risk pregnancy and has High priority for COVID-19 virus vaccination; Supervision of other normal pregnancy, antepartum; Rh negative state in antepartum period; and Short interval between pregnancies affecting pregnancy, antepartum on their problem list.  Patient reports no complaints.  Contractions: Irregular.  .  Movement: Present. Denies leaking of fluid.   The following portions of the patient's history were reviewed and updated as appropriate: allergies, current medications, past family history, past medical history, past social history, past surgical history and problem list.   Objective:   Vitals:   09/13/20 0815  BP: 122/77  Pulse: 94  Weight: 184 lb (83.5 kg)    Fetal Status: Fetal Heart Rate (bpm): 133   Movement: Present     General:  Alert, oriented and cooperative. Patient is in no acute distress.  Skin: Skin is warm and dry. No rash noted.   Cardiovascular: Normal heart rate noted  Respiratory: Normal respiratory effort, no problems with respiration noted  Abdomen: Soft, gravid, appropriate for gestational age.  Pain/Pressure: Present     Pelvic: Cervical exam performed in the presence of a chaperone      Cervix tight 2/50-60/-3/vtx   Membranes swept, + show  Extremities: Normal range of motion.  Edema: None  Mental Status: Normal mood and affect. Normal behavior. Normal judgment and thought content.   Assessment and Plan:  Pregnancy: G3P1011 at [redacted]w[redacted]d 1. Supervision of other normal pregnancy, antepartum     Discussed will be post term next week.  Advised we recommend IOL after 41.  Pt would like option of extending time to 41.5 if possible with NST Monday.  She may consider doing IOL earlier.  Membranes swept today.   Still wants WB if possible (has done class, consent)  2. Rh  negative state in antepartum period     RhIg PP  3. Short interval between pregnancies affecting pregnancy, antepartum      Last labor was fast  Term labor symptoms and general obstetric precautions including but not limited to vaginal bleeding, contractions, leaking of fluid and fetal movement were reviewed in detail with the patient. Please refer to After Visit Summary for other counseling recommendations.   Return in about 5 weeks (around 10/18/2020), or for postpartum (5-6wks), for [employer].  Future Appointments  Date Time Provider Department Center  09/19/2020  9:30 AM [facility] NURSE [facility] None  10/18/2020  9:15 AM Aviva Signs, CNM [facility] None    Wynelle Bourgeois, CNM

## 2020-09-13 NOTE — Telephone Encounter (Signed)
Preadmission screen  

## 2020-09-14 ENCOUNTER — Encounter (HOSPITAL_COMMUNITY): Payer: Self-pay | Admitting: Obstetrics and Gynecology

## 2020-09-14 ENCOUNTER — Inpatient Hospital Stay (HOSPITAL_COMMUNITY)
Admission: AD | Admit: 2020-09-14 | Discharge: 2020-09-15 | DRG: 807 | Disposition: A | Discharge status: Home / Self Care | Payer: Medicaid Other | Attending: Obstetrics and Gynecology | Admitting: Obstetrics and Gynecology

## 2020-09-14 DIAGNOSIS — O480 Post-term pregnancy: Principal | ICD-10-CM | POA: Diagnosis present

## 2020-09-14 DIAGNOSIS — Z3A40 40 weeks gestation of pregnancy: Secondary | ICD-10-CM

## 2020-09-14 DIAGNOSIS — Z6791 Unspecified blood type, Rh negative: Secondary | ICD-10-CM

## 2020-09-14 DIAGNOSIS — O26899 Other specified pregnancy related conditions, unspecified trimester: Secondary | ICD-10-CM

## 2020-09-14 DIAGNOSIS — O26893 Other specified pregnancy related conditions, third trimester: Secondary | ICD-10-CM | POA: Diagnosis present

## 2020-09-14 DIAGNOSIS — Z20822 Contact with and (suspected) exposure to covid-19: Secondary | ICD-10-CM | POA: Diagnosis present

## 2020-09-14 LAB — RESP PANEL BY RT-PCR (FLU A&B, COVID) ARPGX2
Influenza A by PCR: NEGATIVE
Influenza B by PCR: NEGATIVE
SARS Coronavirus 2 by RT PCR: NEGATIVE

## 2020-09-14 LAB — CBC
HCT: 40.5 % (ref 36.0–46.0)
Hemoglobin: 13 g/dL (ref 12.0–15.0)
MCH: 28.9 pg (ref 26.0–34.0)
MCHC: 32.1 g/dL (ref 30.0–36.0)
MCV: 90 fL (ref 80.0–100.0)
Platelets: 147 10*3/uL — ABNORMAL LOW (ref 150–400)
RBC: 4.5 MIL/uL (ref 3.87–5.11)
RDW: 13.2 % (ref 11.5–15.5)
WBC: 16.2 10*3/uL — ABNORMAL HIGH (ref 4.0–10.5)
nRBC: 0 % (ref 0.0–0.2)

## 2020-09-14 LAB — RPR: RPR Ser Ql: NONREACTIVE

## 2020-09-14 MED ORDER — PRENATAL MULTIVITAMIN CH
1.0000 | ORAL_TABLET | Freq: Every day | ORAL | Status: DC
  Administered 2020-09-15: 1 via ORAL
  Filled 2020-09-14: qty 1

## 2020-09-14 MED ORDER — LIDOCAINE HCL (PF) 1 % IJ SOLN: 30.0000 mL | INTRAMUSCULAR | Status: DC | PRN

## 2020-09-14 MED ORDER — DOCUSATE SODIUM 100 MG PO CAPS
100.0000 mg | ORAL_CAPSULE | Freq: Two times a day (BID) | ORAL | Status: DC
  Administered 2020-09-15: 100 mg via ORAL
  Filled 2020-09-14: qty 1

## 2020-09-14 MED ORDER — SIMETHICONE 80 MG PO CHEW: 80.0000 mg | CHEWABLE_TABLET | ORAL | Status: DC | PRN

## 2020-09-14 MED ORDER — OXYTOCIN BOLUS FROM INFUSION
333.0000 mL | Freq: Once | INTRAVENOUS | Status: AC
  Administered 2020-09-14: 333 mL via INTRAVENOUS

## 2020-09-14 MED ORDER — DIPHENHYDRAMINE HCL 25 MG PO CAPS: 25.0000 mg | ORAL_CAPSULE | Freq: Four times a day (QID) | ORAL | Status: DC | PRN

## 2020-09-14 MED ORDER — WITCH HAZEL-GLYCERIN EX PADS: 1.0000 "application " | MEDICATED_PAD | CUTANEOUS | Status: DC | PRN

## 2020-09-14 MED ORDER — LACTATED RINGERS IV SOLN: 500.0000 mL | INTRAVENOUS | Status: DC | PRN

## 2020-09-14 MED ORDER — MISOPROSTOL 25 MCG QUARTER TABLET: 25.0000 ug | ORAL_TABLET | ORAL | Status: DC | PRN

## 2020-09-14 MED ORDER — OXYCODONE-ACETAMINOPHEN 5-325 MG PO TABS: 1.0000 | ORAL_TABLET | ORAL | Status: DC | PRN

## 2020-09-14 MED ORDER — OXYTOCIN-SODIUM CHLORIDE 30-0.9 UT/500ML-% IV SOLN
2.5000 [IU]/h | INTRAVENOUS | Status: DC
  Administered 2020-09-14: 2.5 [IU]/h via INTRAVENOUS
  Filled 2020-09-14: qty 500

## 2020-09-14 MED ORDER — FENTANYL CITRATE (PF) 100 MCG/2ML IJ SOLN
100.0000 ug | INTRAMUSCULAR | Status: DC | PRN
  Administered 2020-09-14: 100 ug via INTRAVENOUS
  Filled 2020-09-14: qty 2

## 2020-09-14 MED ORDER — ONDANSETRON HCL 4 MG PO TABS: 4.0000 mg | ORAL_TABLET | ORAL | Status: DC | PRN

## 2020-09-14 MED ORDER — OXYCODONE-ACETAMINOPHEN 5-325 MG PO TABS: 2.0000 | ORAL_TABLET | ORAL | Status: DC | PRN

## 2020-09-14 MED ORDER — DIBUCAINE (PERIANAL) 1 % EX OINT: 1.0000 "application " | TOPICAL_OINTMENT | CUTANEOUS | Status: DC | PRN

## 2020-09-14 MED ORDER — ONDANSETRON HCL 4 MG/2ML IJ SOLN: 4.0000 mg | INTRAMUSCULAR | Status: DC | PRN

## 2020-09-14 MED ORDER — ACETAMINOPHEN 325 MG PO TABS: 650.0000 mg | ORAL_TABLET | ORAL | Status: DC | PRN

## 2020-09-14 MED ORDER — LACTATED RINGERS IV SOLN: INTRAVENOUS | Status: DC

## 2020-09-14 MED ORDER — ONDANSETRON HCL 4 MG/2ML IJ SOLN: 4.0000 mg | Freq: Four times a day (QID) | INTRAMUSCULAR | Status: DC | PRN

## 2020-09-14 MED ORDER — TETANUS-DIPHTH-ACELL PERTUSSIS 5-2.5-18.5 LF-MCG/0.5 IM SUSY: 0.5000 mL | PREFILLED_SYRINGE | Freq: Once | INTRAMUSCULAR | Status: DC

## 2020-09-14 MED ORDER — COCONUT OIL OIL: 1.0000 "application " | TOPICAL_OIL | Status: DC | PRN

## 2020-09-14 MED ORDER — BENZOCAINE-MENTHOL 20-0.5 % EX AERO
1.0000 "application " | INHALATION_SPRAY | CUTANEOUS | Status: DC | PRN
  Administered 2020-09-14: 1 via TOPICAL
  Filled 2020-09-14: qty 56

## 2020-09-14 MED ORDER — SENNOSIDES-DOCUSATE SODIUM 8.6-50 MG PO TABS
2.0000 | ORAL_TABLET | ORAL | Status: DC
  Administered 2020-09-14 – 2020-09-15 (×2): 2 via ORAL
  Filled 2020-09-14 (×2): qty 2

## 2020-09-14 MED ORDER — SOD CITRATE-CITRIC ACID 500-334 MG/5ML PO SOLN: 30.0000 mL | ORAL | Status: DC | PRN

## 2020-09-14 MED ORDER — IBUPROFEN 600 MG PO TABS
600.0000 mg | ORAL_TABLET | Freq: Four times a day (QID) | ORAL | Status: DC
  Administered 2020-09-14 – 2020-09-15 (×5): 600 mg via ORAL
  Filled 2020-09-14 (×5): qty 1

## 2020-09-14 MED ORDER — TERBUTALINE SULFATE 1 MG/ML IJ SOLN: 0.2500 mg | Freq: Once | INTRAMUSCULAR | Status: DC | PRN

## 2020-09-14 NOTE — Progress Notes (Signed)
Lindsay Phillips is a 25 y.o. G3P1011 at [redacted]w[redacted]d by LMP admitted for active labor  Subjective: Call from RN reporting Neg COVID results and for EFM review.  Objective: Temp 97.8 F (36.6 C) (Oral)   Resp 16   LMP 12/08/2019  No intake/output data recorded. No intake/output data recorded.  FHT:  FHR: 130 bpm, variability: moderate,  accelerations:  Present,  decelerations:  Present variable with each contraction UC:   regular, every 1-3 minutes SVE:   Dilation: 6 Effacement (%): 80 Station: -2 Exam by:: Lestine Box, RN  Labs: Lab Results  Component Value Date   WBC 16.2 (H) 09/14/2020   HGB 13.0 09/14/2020   HCT 40.5 09/14/2020   MCV 90.0 09/14/2020   PLT 147 (L) 09/14/2020    Assessment / Plan: Spontaneous labor, progressing normally  Labor: Progressing normally Preeclampsia:  n/a Fetal Wellbeing:  Category II Pain Control:  Labor support without medications - desires waterbirth I/D:  n/a Anticipated MOD:  NSVD  RN notified that patient is NOT to get in water tub at this time d/t EFM. Dr. Myriam Jacobson notified and assumes care at this time. Dr. Myriam Jacobson will inform patient that she is not candidate for waterbirth with category 2 EFM.   Raelyn Mora, CNM 09/14/2020, 8:40 AM

## 2020-09-14 NOTE — H&P (Signed)
Lindsay Phillips is a 25 y.o. G61P1011 female at [redacted]w[redacted]d by LMP c/w 20wk u/s, presenting in active labor.   Reports active fetal movement, contractions: regular-started @ 0100, vaginal bleeding: some bloody show, membranes: intact.  Initiated prenatal care at [facility] at 9 wks.   Wants waterbirth, had class and cnm/consent visit  This pregnancy complicated by: nothing  Prenatal History/Complications:  Term uncomplicated SVB x 1  Past Medical History: Past Medical History:  Diagnosis Date  . Blood transfusion without reported diagnosis   . Medical history non-contributory     Past Surgical History: Past Surgical History:  Procedure Laterality Date  . INDUCED ABORTION      Obstetrical History: OB History    Gravida  3   Para  1   Term  1   Preterm      AB  1   Living  1     SAB      IAB  1   Ectopic      Multiple  0   Live Births  1           Social History: Social History   Socioeconomic History  . Marital status: Married    Spouse name: Not on file  . Number of children: 1  . Years of education: Not on file  . Highest education level: Master's degree (e.g., MA, MS, MEng, MEd, MSW, MBA)  Occupational History  . Not on file  Tobacco Use  . Smoking status: Never Smoker  . Smokeless tobacco: Never Used  Vaping Use  . Vaping Use: Never used  Substance and Sexual Activity  . Alcohol use: Not Currently    Comment: not in pregnancy  . Drug use: Never  . Sexual activity: Yes    Birth control/protection: None  Other Topics Concern  . Not on file  Social History Narrative  . Not on file   Social Determinants of Health   Financial Resource Strain: Not on file  Food Insecurity: Not on file  Transportation Needs: Not on file  Physical Activity: Not on file  Stress: Not on file  Social Connections: Not on file    Family History: Family History  Problem Relation Age of Onset  . Healthy Mother   . Healthy Father   . Hypertension Maternal  Grandmother   . Lupus Paternal Grandmother     Allergies: No Known Allergies  Medications Prior to Admission  Medication Sig Dispense Refill Last Dose  . Prenatal Vit-Fe Fumarate-FA (PRENATAL VITAMINS PO) Take by mouth.   09/13/2020 at Unknown time    Review of Systems  Pertinent pos/neg as indicated in HPI  Temperature 97.8 F (36.6 C), temperature source Oral, resp. rate 16, last menstrual period 12/08/2019, unknown if currently breastfeeding. General appearance: alert, cooperative and mild distress w/ uc's Lungs: clear to auscultation bilaterally Heart: regular rate and rhythm Abdomen: gravid, soft, non-tender Extremities: trace edema  Fetal monitoring: FHR: 125 bpm, variability: moderate,  Accelerations: Present,  Decelerations: earlies, occ mild variables Uterine activity: q 2-75mins Dilation: 6 Effacement (%): 80 Station: -2 Exam by:: Lestine Box, RN Presentation: cephalic   Prenatal labs: ABO, Rh: O/Negative/-- (09/15 1426) Antibody: Negative (02/01 0921) Rubella: 2.84 (09/15 1426) RPR: Non Reactive (02/01 0917)  HBsAg: Negative (09/15 1426)  HIV: Non Reactive (02/01 0917)  GBS: Negative/-- (03/29 0932)  2hr GTT: normal  Prenatal Transfer Tool  Maternal Diabetes: No Genetic Screening: Normal Maternal Ultrasounds/Referrals: Normal Fetal Ultrasounds or other Referrals:  None Maternal Substance Abuse:  No Significant Maternal Medications:  None Significant Maternal Lab Results: Group B Strep negative  No results found for this or any previous visit (from the past 24 hour(s)).   Assessment:  [redacted]w[redacted]d SIUP  G3P1011  Active labor  Cat 2 FHR  Wants waterbirth  GBS Negative/-- (03/29 0932)  Plan:  Admit to L&D  IV pain meds/epidural prn active labor  Expectant management  Mild variables w/ uc's, monitor while getting tub ready to see if ok to get in water  Anticipate NSVB    Plans to breastfeed  Contraception: POPs  Circumcision: yes, at hosp  Cheral Marker CNM, WHNP-BC 09/14/2020, 7:46 AM

## 2020-09-14 NOTE — Lactation Note (Signed)
This note was copied from a baby's chart. Lactation Consultation Note  Patient Name: Boy Jonai Weyland MVHQI'[id number] Date: 09/14/2020 Reason for consult: L&D Initial assessment Age:26 hours  P2, Ex BF 15 mos.  Mother had baby latched when [facility] entered room. Per mother baby latched on his own.  Mother states she had difficulty with latching her first child. Mother was happy this baby easily latched.   Follow up on MBU.    Maternal Data Does the patient have breastfeeding experience prior to this delivery?: Yes How long did the patient breastfeed?: 15 mos.  Feeding Mother's Current Feeding Choice: Breast Milk  LATCH Score Latch: Grasps breast easily, tongue down, lips flanged, rhythmical sucking. (Baby latched on own)  Audible Swallowing: Spontaneous and intermittent  Type of Nipple: Everted at rest and after stimulation  Comfort (Breast/Nipple): Soft / non-tender  Hold (Positioning): Assistance needed to correctly position infant at breast and maintain latch.  LATCH Score: 9    Interventions Interventions: Education     Consult Status Consult Status: Follow-up Date: 09/14/20 Follow-up type: In-patient    Dahlia Byes [facility] 09/14/2020, 9:55 AM

## 2020-09-14 NOTE — MAU Note (Addendum)
Ctx started around 0100, reports ctx 2-3 min apart. Reports bloody mucus but denies VB, denies LOF, reports +FM.   Pt requests water birth; consents signed.   GBS neg

## 2020-09-14 NOTE — Discharge Summary (Signed)
Postpartum Discharge Summary     Patient Name: Lindsay Phillips DOB: [birth year] MRN: [id number]  Date of admission: 09/14/2020 Delivery date:09/14/2020  Delivering provider: Janet Berlin  Date of discharge: 09/15/2020  Admitting diagnosis: Post term pregnancy [O48.0] Intrauterine pregnancy: [redacted]w[redacted]d    Secondary diagnosis:  Active Problems:   Rh negative state in antepartum period   Vaginal delivery  Additional problems: nONE    Discharge diagnosis: Term Pregnancy Delivered                                              Post partum procedures:  Rhogam Augmentation: AROM Complications: None  Hospital course: Onset of Labor With Vaginal Delivery      25 y.o. yo G3P1011 at 48w1das admitted in Active Labor on 09/14/2020. Patient had an uncomplicated labor course as follows:  Membrane Rupture Time/Date: 9:00 AM ,09/14/2020   Delivery Method:Vaginal, Spontaneous  Episiotomy: None  Lacerations:  None  Patient had an uncomplicated postpartum course.  She is ambulating, tolerating a regular diet, passing flatus, and urinating well.  Received Rhogam prior to discharge. Patient is discharged home in stable condition on 09/15/20.  Newborn Data: Birth date:09/14/2020  Birth time:9:04 AM  Gender:Female  Living status:Living  Apgars:9 ,9  Weight:3079 g   Magnesium Sulfate received: No BMZ received: No Rhophylac:Yes  MMR:N/A T-DaP:Given prenatally Flu: Yes Transfusion:No  Physical exam  Vitals:   09/14/20 1645 09/14/20 2018 09/15/20 0012 09/15/20 0615  BP: 104/69 107/65 102/66 109/78  Pulse: 67 76 73 93  Resp: '16 18 18 18  ' Temp: 97.8 F (36.6 C) 98.3 F (36.8 C) 98.4 F (36.9 C) 98.4 F (36.9 C)  TempSrc: Oral Oral Oral Oral  SpO2:  99% 100% 100%   General: alert, cooperative and no distress Lochia: appropriate Uterine Fundus: firm, NT DVT Evaluation: No evidence of DVT seen on physical exam. Negative Homan's sign. No cords or calf tenderness. No significant calf/ankle  edema. Labs: Lab Results  Component Value Date   WBC 16.2 (H) 09/14/2020   HGB 13.0 09/14/2020   HCT 40.5 09/14/2020   MCV 90.0 09/14/2020   PLT 147 (L) 09/14/2020   No flowsheet data found. Edinburgh Score: Edinburgh Postnatal Depression Scale Screening Tool 09/15/2020  I have been able to laugh and see the funny side of things. 0  I have looked forward with enjoyment to things. 0  I have blamed myself unnecessarily when things went wrong. 1  I have been anxious or worried for no good reason. 1  I have felt scared or panicky for no good reason. 0  Things have been getting on top of me. 0  I have been so unhappy that I have had difficulty sleeping. 0  I have felt sad or miserable. 0  I have been so unhappy that I have been crying. 0  The thought of harming myself has occurred to me. 0  Edinburgh Postnatal Depression Scale Total 2     After visit meds:  Allergies as of 09/15/2020   No Known Allergies     Medication List    TAKE these medications   ibuprofen 600 MG tablet Commonly known as: ADVIL Take 1 tablet (600 mg total) by mouth every 6 (six) hours.   norethindrone 0.35 MG tablet Commonly known as: MICRONOR Take 1 tablet (0.35 mg total) by mouth daily. Start taking  on: Oct 10, 2020   PRENATAL VITAMINS PO Take by mouth.        Discharge home in stable condition Infant Feeding: Breast Infant Disposition:home with mother Discharge instruction: per After Visit Summary and Postpartum booklet. Activity: Advance as tolerated. Pelvic rest for 6 weeks.  Diet: routine diet Future Appointments: Future Appointments  Date Time Provider [place]  10/18/2020  9:15 AM Seabron Spates, CNM [facility] None   Follow up Visit:   Please schedule this patient for a In person postpartum visit in 6 weeks with the following provider: Any provider. Additional Postpartum F/U:none  Low risk pregnancy complicated by: uncomplicated Delivery mode:  Vaginal, Spontaneous   Anticipated Birth Control:  POPs   09/15/2020 Verita Schneiders, MD

## 2020-09-15 ENCOUNTER — Encounter (HOSPITAL_COMMUNITY): Payer: Self-pay | Admitting: Family Medicine

## 2020-09-15 LAB — TYPE AND SCREEN
ABO/RH(D): O NEG
Antibody Screen: POSITIVE

## 2020-09-15 MED ORDER — RHO D IMMUNE GLOBULIN 1500 UNIT/2ML IJ SOSY
300.0000 ug | PREFILLED_SYRINGE | Freq: Once | INTRAMUSCULAR | Status: AC
  Administered 2020-09-15: 300 ug via INTRAVENOUS
  Filled 2020-09-15: qty 2

## 2020-09-15 MED ORDER — IBUPROFEN 600 MG PO TABS: 600.0000 mg | ORAL_TABLET | Freq: Four times a day (QID) | ORAL | 0 refills | Status: DC

## 2020-09-15 MED ORDER — NORETHINDRONE 0.35 MG PO TABS: 1.0000 | ORAL_TABLET | Freq: Every day | ORAL | 11 refills | Status: DC

## 2020-09-15 NOTE — Lactation Note (Signed)
This note was copied from a baby's chart. Lactation Consultation Note  Patient Name: Lindsay Phillips ITGPQ'D Date: 09/15/2020 Reason for consult: Follow-up assessment;Term Age:26 hours   P2 mother whose infant is now 3 hours old.  This is a term baby at 40+1 weeks.  Mother breast fed her first child(now 21 months) for 15 months.  Mother was preparing to breast feed when I arrived.  Offered to assist and suggested the cross cradle or football hold.  Assisted baby to latch in the cross cradle hold easily.  He had a wide gape and flanged lips.  Baby sucked on/off and seemed restless.  Suggested mother hand express and offered to try another hold.  Positioned him in the football hold and, again, he latched easily and fed for a total of 10 minutes before releasing.  Placed him STS and he remained calm.  Mother anticipating a discharge today.  Father present.    Offered to return as needed for assistance.  Mother has a DEBP for home use.   Maternal Data Has patient been taught Hand Expression?: Yes Does the patient have breastfeeding experience prior to this delivery?: Yes How long did the patient breastfeed?: 15 months  Feeding Mother's Current Feeding Choice: Breast Milk  LATCH Score Latch: Grasps breast easily, tongue down, lips flanged, rhythmical sucking.  Audible Swallowing: A few with stimulation  Type of Nipple: Everted at rest and after stimulation  Comfort (Breast/Nipple): Soft / non-tender  Hold (Positioning): Assistance needed to correctly position infant at breast and maintain latch.  LATCH Score: 8   Lactation Tools Discussed/Used    Interventions Interventions: Breast feeding basics reviewed;Assisted with latch;Skin to skin;Breast massage;Hand express;Breast compression;Adjust position;Position options;Support pillows;Education  Discharge Discharge Education: Engorgement and breast care Pump: Personal  Consult Status Consult Status: Complete Date:  09/15/20 Follow-up type: In-patient    Taylr Meuth R Lilias Lorensen 09/15/2020, 8:19 AM

## 2020-09-15 NOTE — Discharge Instructions (Signed)

## 2020-09-15 NOTE — Progress Notes (Signed)
Attending Circumcision Counseling Progress Note  Patient desires circumcision for her female infant.  Circumcision procedure details discussed, risks and benefits of procedure were also discussed.  These include but are not limited to: Benefits of circumcision in men include reduction in the rates of urinary tract infection (UTI), penile cancer, some sexually transmitted infections, penile inflammatory and retractile disorders, as well as easier hygiene.  Risks include bleeding , infection, injury of glans which may lead to penile deformity or urinary tract issues, unsatisfactory cosmetic appearance and other potential complications related to the procedure.  It was emphasized that this is an elective procedure.  Patient wants to proceed with circumcision; written informed consent obtained.  Will do circumcision soon, routine circumcision and post circumcision care ordered for the infant.  Jaynie Collins, M.D. 09/15/2020 12:12 PM

## 2020-09-15 NOTE — Progress Notes (Deleted)
POSTPARTUM PROGRESS NOTE  Post Partum Day 1  Subjective:  Lindsay Phillips is a 25 y.o. [id number] s/p SVD at [redacted]w[redacted]d.  No acute events overnight.  Pt denies problems with ambulating, voiding or po intake.  She denies nausea or vomiting.  Pain is well controlled.  She has had flatus. She has not had bowel movement.  Lochia Minimal.   Objective: Blood pressure 109/78, pulse 93, temperature 98.4 F (36.9 C), temperature source Oral, resp. rate 18, last menstrual period 12/08/2019, SpO2 100 %, unknown if currently breastfeeding.  Physical Exam:  General: alert, cooperative and no distress Chest: no respiratory distress Heart:regular rate Abdomen: soft, nontender Uterine Fundus: firm, appropriately tender DVT Evaluation: No calf swelling or tenderness Extremities: No LE edema Skin: warm, dry  Recent Labs    09/14/20 0801  HGB 13.0  HCT 40.5    Assessment/Plan: Lindsay Phillips is a 25 y.o. [id number] s/p SVD at [redacted]w[redacted]d   PPD#1 - Doing well Contraception: POPs Rh Nrg: Baby O+, RhoGam prior to discharge   Feeding: breast Dispo: Plan for discharge today.   LOS: 1 day   Dot Lanes, Medical Student 09/15/2020, 6:42 AM

## 2020-09-16 LAB — RH IG WORKUP (INCLUDES ABO/RH)
ABO/RH(D): O NEG
Fetal Screen: NEGATIVE
Gestational Age(Wks): 40
Unit division: 0

## 2020-09-19 ENCOUNTER — Other Ambulatory Visit: Payer: Medicaid Other

## 2020-09-19 ENCOUNTER — Other Ambulatory Visit (HOSPITAL_COMMUNITY): Payer: Medicaid Other

## 2020-09-21 ENCOUNTER — Inpatient Hospital Stay (HOSPITAL_COMMUNITY): Admission: AD | Admit: 2020-09-21 | Payer: Medicaid Other | Source: Home / Self Care | Admitting: Family Medicine

## 2020-09-21 ENCOUNTER — Inpatient Hospital Stay (HOSPITAL_COMMUNITY): Payer: Medicaid Other

## 2020-10-04 ENCOUNTER — Telehealth: Payer: Self-pay

## 2020-10-04 DIAGNOSIS — B379 Candidiasis, unspecified: Secondary | ICD-10-CM

## 2020-10-04 MED ORDER — FLUCONAZOLE 150 MG PO TABS: ORAL_TABLET | ORAL | 1 refills | Status: DC

## 2020-10-04 NOTE — Telephone Encounter (Signed)
Pt called stating she thinks she has a yeast infection because she is having some vaginal irritation from wearing pads. Diflucan was sent to her pharmacy. Madelynn Malson l Telesia Ates, CMA

## 2020-10-18 ENCOUNTER — Ambulatory Visit (INDEPENDENT_AMBULATORY_CARE_PROVIDER_SITE_OTHER): Payer: Medicaid Other | Admitting: Medical

## 2020-10-18 ENCOUNTER — Other Ambulatory Visit: Payer: Self-pay

## 2020-10-18 ENCOUNTER — Encounter: Payer: Self-pay | Admitting: Medical

## 2020-10-18 DIAGNOSIS — N393 Stress incontinence (female) (male): Secondary | ICD-10-CM | POA: Diagnosis not present

## 2020-10-18 NOTE — Progress Notes (Signed)
Post Partum Visit Note  Lindsay Phillips is a 25 y.o. G2P2012 female who presents for a postpartum visit. She is 4 weeks postpartum following a normal spontaneous vaginal delivery.  I have fully reviewed the prenatal and intrapartum course. The delivery was at 40 gestational weeks.  Anesthesia: none. Postpartum course has been uncomplicated. Baby is doing well. Baby is feeding by breast. Bleeding no bleeding. Bowel function is normal. Bladder function is normal. Patient is not sexually active. Contraception method is oral progesterone-only contraceptive. Postpartum depression screening: negative.   The pregnancy intention screening data noted above was reviewed. Potential methods of contraception were discussed. The patient elected to proceed with Oral Contraceptive.    Edinburgh Postnatal Depression Scale - 10/18/20 0936      Edinburgh Postnatal Depression Scale:  In the Past 7 Days   I have been able to laugh and see the funny side of things. 0    I have looked forward with enjoyment to things. 0    I have blamed myself unnecessarily when things went wrong. 1    I have been anxious or worried for no good reason. 1    I have felt scared or panicky for no good reason. 0    Things have been getting on top of me. 0    I have been so unhappy that I have had difficulty sleeping. 0    I have felt sad or miserable. 0    I have been so unhappy that I have been crying. 0    The thought of harming myself has occurred to me. 0    Edinburgh Postnatal Depression Scale Total 2           Health Maintenance Due  Topic Date Due  . HPV VACCINES (1 - 2-dose series) Never done  . COVID-19 Vaccine (3 - Booster for Pfizer series) 02/10/2020    The following portions of the patient's history were reviewed and updated as appropriate: allergies, current medications, past family history, past medical history, past social history, past surgical history and problem list.  Review of Systems Pertinent items  are noted in HPI.  Objective:  BP 104/68   Pulse 69   Temp 98.2 F (36.8 C)   Wt 172 lb 1.3 oz (78.1 kg)   LMP 12/08/2019   BMI 27.77 kg/m    General:  alert and cooperative   Breasts:  not indicated  Lungs: clear to auscultation bilaterally  Heart:  regular rate and rhythm, S1, S2 normal, no murmur, click, rub or gallop  Abdomen: soft, non-tender; bowel sounds normal; no masses,  no organomegaly   Wound N/A  GU exam:  not indicated       Assessment:   Normal postpartum exam.   Plan:   Essential components of care per ACOG recommendations:  1.  Mood and well being: Patient with negative depression screening today. Reviewed local resources for support.  - Patient tobacco use? No.   - hx of drug use? No.    2. Infant care and feeding:  -Patient currently breastmilk feeding? Yes. Discussed returning to work and pumping. Reviewed importance of draining breast regularly to support lactation.  -Social determinants of health (SDOH) reviewed in EPIC. No concerns.   3. Sexuality, contraception and birth spacing - Patient does want a pregnancy in the next year.  Desired family size is 3 children.  - Reviewed forms of contraception in tiered fashion. Patient desired oral progesterone-only contraceptive today.   - Discussed birth  spacing of 18 months  4. Sleep and fatigue -Encouraged family/partner/community support of 4 hrs of uninterrupted sleep to help with mood and fatigue  5. Physical Recovery  - Discussed patients delivery and complications. She describes her labor as good. - Patient had a Vaginal, no problems at delivery. Patient had a no laceration. Perineal healing reviewed. Patient expressed understanding - Patient has urinary incontinence? Yes. Discussed role of pelvic floor PT. Offered PT and patient accepted. Patient was referred to pelvic floor PT.  - Patient is not safe to resume physical and sexual activity. Advised to gradually return starting at 6-8 weeks PP.    6.  Health Maintenance - HM due items addressed Yes - Last pap smear  Diagnosis  Date Value Ref Range Status  11/20/2019   Final   - Negative for intraepithelial lesion or malignancy (NILM)   Pap smear not done at today's visit.  -Breast Cancer screening indicated? No.   7. Chronic Disease/Pregnancy Condition follow up: None  List of PCP given and link to PCP online appointments provided   Vonzella Nipple, PA-C [facility] [employer], [facility]

## 2020-10-18 NOTE — Patient Instructions (Signed)
Postpartum Care After Vaginal Delivery The following information offers guidance about how to care for yourself from the time you deliver your baby to 6-12 weeks after delivery (postpartum period). If you have problems or questions, contact your health care provider for more specific instructions. Follow these instructions at home: Vaginal bleeding  It is normal to have vaginal bleeding (lochia) after delivery. Wear a sanitary pad for bleeding and discharge. ? During the first week after delivery, the amount and appearance of lochia is often similar to a menstrual period. ? Over the next few weeks, it will gradually decrease to a dry, yellow-brown discharge. ? For most women, lochia stops completely by 4-6 weeks after delivery, but can vary.  Change your sanitary pads frequently. Watch for any changes in your flow, such as: ? A sudden increase in volume. ? A change in color. ? Large blood clots.  If you pass a blood clot from your vagina, save it and call your health care provider. Do not flush blood clots down the toilet before talking with your health care provider.  Do not use tampons or douches until your health care provider approves.  If you are not breastfeeding, your period should return 6-8 weeks after delivery. If you are feeding your baby breast milk only, your period may not return until you stop breastfeeding. Perineal care  Keep the area between the vagina and the anus (perineum) clean and dry. Use medicated pads and pain-relieving sprays and creams as directed.  If you had a surgical cut in the perineum (episiotomy) or a tear, check the area for signs of infection until you are healed. Check for: ? More redness, swelling, or pain. ? Fluid or blood coming from the cut or tear. ? Warmth. ? Pus or a bad smell.  You may be given a squirt bottle to use instead of wiping to clean the perineum area after you use the bathroom. Pat the area gently to dry it.  To relieve pain  caused by an episiotomy, a tear, or swollen veins in the anus (hemorrhoids), take a warm sitz bath 2-3 times a day. In a sitz bath, the warm water should only come up to your hips and cover your buttocks.   Breast care  In the first few days after delivery, your breasts may feel heavy, full, and uncomfortable (breast engorgement). Milk may also leak from your breasts. Ask your health care provider about ways to help relieve the discomfort.  If you are breastfeeding: ? Wear a bra that supports your breasts and fits well. Use breast pads to absorb milk that leaks. ? Keep your nipples clean and dry. Apply creams and ointments as told. ? You may have uterine contractions every time you breastfeed for up to several weeks after delivery. This helps your uterus return to its normal size. ? If you have any problems with breastfeeding, notify your health care provider or lactation consultant.  If you are not breastfeeding: ? Avoid touching your breasts. Do not squeeze out (express) milk. Doing this can make your breasts produce more milk. ? Wear a good-fitting bra and use cold packs to help with swelling. Intimacy and sexuality  Ask your health care provider when you can engage in sexual activity. This may depend upon: ? Your risk of infection. ? How fast you are healing. ? Your comfort and desire to engage in sexual activity.  You are able to get pregnant after delivery, even if you have not had your period. Talk with   your health care provider about methods of birth control (contraception) or family planning if you desire future pregnancies. Medicines  Take over-the-counter and prescription medicines only as told by your health care provider.  Take an over-the-counter stool softener to help ease bowel movements as told by your health care provider.  If you were prescribed an antibiotic medicine, take it as told by your health care provider. Do not stop taking the antibiotic even if you start to  feel better.  Review all previous and current prescriptions to check for possible transfer into breast milk. Activity  Gradually return to your normal activities as told by your health care provider.  Rest as much as possible. Nap while your baby is sleeping. Eating and drinking  Drink enough fluid to keep your urine pale yellow.  To help prevent or relieve constipation, eat high-fiber foods every day.  Choose healthy eating to support breastfeeding or weight loss goals.  Take your prenatal vitamins until your health care provider tells you to stop.   General tips/recommendations  Do not use any products that contain nicotine or tobacco. These products include cigarettes, chewing tobacco, and vaping devices, such as e-cigarettes. If you need help quitting, ask your health care provider.  Do not drink alcohol, especially if you are breastfeeding.  Do not take medications or drugs that are not prescribed to you, especially if you are breastfeeding.  Visit your health care provider for a postpartum checkup within the first 3-6 weeks after delivery.  Complete a comprehensive postpartum visit no later than 12 weeks after delivery.  Keep all follow-up visits for you and your baby. Contact a health care provider if:  You feel unusually sad or worried.  Your breasts become red, painful, or hard.  You have a fever or other signs of an infection.  You have bleeding that is soaking through one pad an hour or you have blood clots.  You have a severe headache that doesn't go away or you have vision changes.  You have nausea and vomiting and are unable to eat or drink anything for 24 hours. Get help right away if:  You have chest pain or difficulty breathing.  You have sudden, severe leg pain.  You faint or have a seizure.  You have thoughts about hurting yourself or your baby. If you ever feel like you may hurt yourself or others, or have thoughts about taking your own life,  get help right away. Go to your nearest emergency department or:  Call your local emergency services (911 in the [place]  The National Suicide Prevention Lifeline at (810) 620-8367. This suicide crisis helpline is open 24 hours a day.  Text the Crisis Text Line at 330 267 8507 (in the U.S.). Summary  The period of time after you deliver your newborn up to 6-12 weeks after delivery is called the postpartum period.  Keep all follow-up visits for you and your baby.  Review all previous and current prescriptions to check for possible transfer into breast milk.  Contact a health care provider if you feel unusually sad or worried during the postpartum period. This information is not intended to replace advice given to you by your health care provider. Make sure you discuss any questions you have with your health care provider. Document Revised: 01/28/2020 Document Reviewed: 01/28/2020 Elsevier Patient Education  2021 Elsevier Inc.  AREA FAMILY PRACTICE PHYSICIANS  [place] ([address]) . [facility] o [address] [place] [place], [place] [address] o [phone] o Mon-Fri  8:30-12:30, 1:30-5:00 o Accepting Medicaid . [facility] at [facility] [address], [place], [place] [address] o [phone] o Mon-Fri 8:00-5:30 . [place] o [address], [place], [place] [address] o [phone], Tue, Thur, Fri 8:30-5:00, Wed 10:00-7:00 (closed 1-2pm) o Accepting Medicaid . [facility] o [address], [place], [place]  [address] o Phone - [phone]   Fax - [phone]  [place] [phone]) . [facility] o [address] [place], [place] [address] o [phone] o Mon-Fri 8:00-5:00 . [facility] Pediatrics at [facility])  o [address] [place] [place], [place] [address] o [phone] o Mon-Fri 8:30-5:30, Sat  (Oct.-Mar.) 9:00-1:00 o Accepting [facility] [phone]) . [facility] at [facility] o [address], [place], [place] [address] o [phone] o Mon-Fri 8:00-5:00  [place] [phone]) . [facility] Medicine at [facility] o [address], [place], [place] [address] o [phone] o Mon-Fri 8:00-5:00 . Nature conservation officer at [place] o [address] [place], [place], [place] [address] o [phone] o Mon-Fri 8:00-5:00 . Nature conservation officer at [employer] o [address] [place], [place] [address] o [phone] o Mon-Fri 8:00-5:00 . [facility] o [address] [place] [address] o [phone] o Mon-Fri 7:30-5:30  [place] [phone] & [phone]) . [facility] o [address] [place], [place] [address] o [phone] o Mon-Thur 8:00-6:00 o Accepting Medicaid . [facility] Medicine o [address] [place], [place] [address] o [phone] o Mon-Thur 7:30-7:30, Fri 7:30-4:30 o Accepting Medicaid . [facility] at [facility] o [phone] [place], [place]  [address] o [phone]   Fax - [phone]  [place] [phone] & [phone]) . Nature conservation officer at [employer] o [address] [place], [place] [address] o [phone] o Mon-Fri 7:00-5:00 . [facility] o [address], [place], [place] [address] o [phone] o Mon-Fri 8:00-5:00 o Accepting Medicaid . [facility] - [facility] o [address] [place], [place] [address] o [phone] o Mon-Fri 8:00-5:00 o Accepting Medicaid  [facility] [phone]) . [facility] Primary Care at [facility] o [address] [place], [place] [address] o [phone] o Mon-Fri 8:00-5:00 . [facility] - [facility] Family Medicine at  [employer]) o [address], [place], [place] [address] o [phone] o Mon-Fri 8:00-5:00 o Accepting Medicaid . [facility] - [facility] at [employer]) o [address], [place], [place] [address] o [phone] o Mon-Fri 8:00-5:30, Sat&Sun by appointment (phones open at 8:30) o Accepting [facility] [phone] & [phone]) . [facility] Medicine o [address] [place], [place] [address] o [phone] o Mon-Thur 8:00-7:00, Fri 8:00-5:00, Sat 8:00-12:00, Sun 9:00-12:00 o Accepting Medicaid . [facility] - Family Medicine at [facility] [address], [place], [place] [address] o [phone] o Mon-Thur 8:00-5:00 o Accepting Medicaid . [facility] - [facility] Medicine at [place] o [address] [place] [place], [place] [address] o [phone] o Mon-Fri 8:00-5:30, Sat (Oct.-Mar.) 9:00-1:00 o Accepting [employer] [phone]) . [facility] Medicine o [address] [place], [place] [address] o [phone] o Mon-Fri 8:00-5:00 o Accepting Medicaid   [place] (  [phone]) . [facility] Medicine at [facility] o [address], [place], [place] [address] o [phone] o Mon-Fri 8:00-5:00 . Nature conservation officer at [facility] o [address], [place], [place] [address] o [phone] o Mon-Fri 8:00-5:00 . [facility] - [place] o [address], [place], [place] [address] o [phone] o Mon-Fri 8:00-5:00 o After hours clinic [facility] [place], [place] [address]) [phone] Mon-Fri 5:00-8:00, Sat 12:00-6:00, Sun 10:00-4:00 o Accepting Medicaid . [facility] Medicine at [facility] o [address] [place], [place]  [address] o [phone]   Fax - [phone]  [place] [phone]) . Nature conservation officer at [facility] o [address] [place], [place] [address] o [phone] o Mon-Fri 8:00-5:00 . [facility]  [facility] Family Medicine - [place] [facility] at [place]) o [address] [place], [place] [address] o [phone] o Mon-Thur 8:00-7:00, Fri 8:00-5:00, Sat 8:00-12:00    [email or web address] To make a new primary care appointment online

## 2020-11-09 ENCOUNTER — Ambulatory Visit: Payer: Medicaid Other | Admitting: Nurse Practitioner

## 2020-11-21 DIAGNOSIS — Z20822 Contact with and (suspected) exposure to covid-19: Secondary | ICD-10-CM | POA: Diagnosis not present

## 2020-12-14 DIAGNOSIS — Z130 Encounter for screening for diseases of the blood and blood-forming organs and certain disorders involving the immune mechanism: Secondary | ICD-10-CM | POA: Diagnosis not present

## 2020-12-14 DIAGNOSIS — Z114 Encounter for screening for human immunodeficiency virus [HIV]: Secondary | ICD-10-CM | POA: Diagnosis not present

## 2020-12-14 DIAGNOSIS — Z113 Encounter for screening for infections with a predominantly sexual mode of transmission: Secondary | ICD-10-CM | POA: Diagnosis not present

## 2020-12-14 DIAGNOSIS — Z3009 Encounter for other general counseling and advice on contraception: Secondary | ICD-10-CM | POA: Diagnosis not present

## 2020-12-14 DIAGNOSIS — M791 Myalgia, unspecified site: Secondary | ICD-10-CM | POA: Diagnosis not present

## 2020-12-14 DIAGNOSIS — Z13220 Encounter for screening for lipoid disorders: Secondary | ICD-10-CM | POA: Diagnosis not present

## 2020-12-14 DIAGNOSIS — Z131 Encounter for screening for diabetes mellitus: Secondary | ICD-10-CM | POA: Diagnosis not present

## 2020-12-14 DIAGNOSIS — Z3200 Encounter for pregnancy test, result unknown: Secondary | ICD-10-CM | POA: Diagnosis not present

## 2020-12-14 DIAGNOSIS — Z0189 Encounter for other specified special examinations: Secondary | ICD-10-CM | POA: Diagnosis not present

## 2020-12-14 DIAGNOSIS — Z1329 Encounter for screening for other suspected endocrine disorder: Secondary | ICD-10-CM | POA: Diagnosis not present

## 2020-12-27 ENCOUNTER — Encounter: Payer: Self-pay | Admitting: Physical Therapy

## 2020-12-27 ENCOUNTER — Encounter: Payer: Medicaid Other | Attending: Medical | Admitting: Physical Therapy

## 2020-12-27 ENCOUNTER — Other Ambulatory Visit: Payer: Self-pay

## 2020-12-27 DIAGNOSIS — M6281 Muscle weakness (generalized): Secondary | ICD-10-CM | POA: Diagnosis not present

## 2020-12-27 DIAGNOSIS — R252 Cramp and spasm: Secondary | ICD-10-CM

## 2020-12-27 DIAGNOSIS — N393 Stress incontinence (female) (male): Secondary | ICD-10-CM | POA: Diagnosis not present

## 2020-12-27 NOTE — Patient Instructions (Signed)
Access Code: [email or web address] URL: [email or web address] Date: 12/27/2020 Prepared by: Eulis Foster  Program Notes put coconut oil on the vulvar area daily massage the perineal body and sides of the vaginal 3 times per weeks. Stroke from 1:00 to 6: 00 20x then 11:00 to 6:00 20x, then move finger on the back of the vagina to loosen the tissue.  massage the perineal body   Exercises Supine Transversus Abdominis Bracing - Hands on Stomach - 3 x daily - 7 x weekly - 1 sets - 10 reps Eulis Foster, PT [facility] [address] [place], [place] [address] W: [phone] [email or web address]

## 2020-12-27 NOTE — Therapy (Signed)
[facility] [address], [address] [place], [place], [address] Phone: [phone]   Fax:  [phone]  Physical Therapy Evaluation  Patient Details  Name: Lindsay PinaMariah Phillips MRN: [id number] Date of Birth: Mar 21, 1996 Referring Provider (PT): Vonzella NippleJulie Wenzel, PA-C   Encounter Date: 12/27/2020   PT End of Session - 12/27/20 1540     Visit Number 1    Date for PT Re-Evaluation 03/21/21    Authorization Type [place] healthy blue    PT Start Time 1130    PT Stop Time 1215    PT Time Calculation (min) 45 min    Activity Tolerance Patient tolerated treatment well;No increased pain    Behavior During Therapy WFL for tasks assessed/performed             Past Medical History:  Diagnosis Date   Blood transfusion without reported diagnosis    Medical history non-contributory     Past Surgical History:  Procedure Laterality Date   INDUCED ABORTION      There were no vitals filed for this visit.    Subjective Assessment - 12/27/20 1139     Subjective Patient reports after she had her second child on 09/14/2020. Pateint will leak urine when she sneezes, coughs.    Patient Stated Goals strengthen her pelvic floor    Currently in Pain? Yes    Pain Score 7     Pain Location Vagina    Pain Orientation Mid    Pain Descriptors / Indicators Discomfort    Pain Type Acute pain    Pain Onset More than a month ago    Pain Frequency Intermittent    Aggravating Factors  During initial vaginal penetration- hurts less with lubricant    Pain Relieving Factors no vaginal penetration    Multiple Pain Sites No                [facility] PT Assessment - 12/27/20 0001       Assessment   Medical Diagnosis N39.3 Female stress incontinence    Referring Provider (PT) Vonzella NippleJulie Wenzel, PA-C    Onset Date/Surgical Date 09/14/20    Prior Therapy none      Precautions   Precautions None      Restrictions   Weight Bearing Restrictions No      Balance  Screen   Has the patient fallen in the past 6 months No    Has the patient had a decrease in activity level because of a fear of falling?  No    Is the patient reluctant to leave their home because of a fear of falling?  No      Home [employer] manag[facility]   Living Environment Private residence      Prior Function   Level of Independence Independent    Vocation Full time employment    Vocation Requirements speech therapist    Leisure workout daily with [employer] app, hit workouts      Cognition   Overall Cognitive Status Within Functional Limits for tasks assessed      Observation/Other Assessments   Focus on Therapeutic Outcomes (FOTO)  PFIQ-7 29; UIQ-7 19; CRAIQ-7 5; POPIQ-7 5      Posture/Postural Control   Posture/Postural Control No significant limitations      ROM / Strength   AROM / PROM / Strength AROM;PROM;Strength      AROM   Lumbar Extension decreased by 25%      Strength   Overall Strength Comments tightens upper  abdominals and buges the lower      Palpation   SI assessment  left ilium post. rotated    Palpation comment tenderness located on the lateral left thigh, left lower abdomen                        Objective measurements completed on examination: See above findings.     Pelvic Floor Special Questions - 12/27/20 0001     Prior Pregnancies Yes    Number of Vaginal Deliveries 2    Any difficulty with labor and deliveries --   2020 she had a second degree tear   Diastasis Recti 3 finger width above the umbilicus and 2 fingers width below    Currently Sexually Active Yes    Is this Painful Yes   needs lubricant, hurts initial penetration   Marinoff Scale discomfort that does not affect completion    Urinary Leakage Yes    Pad use none    Activities that cause leaking With strong urge;Coughing;Sneezing;Laughing;Bending;Exercising    Urinary urgency Yes    Fecal incontinence No   strain for BM and uses a stool, sometimes Type 1   Falling out  feeling (prolapse) Yes    Activities that cause feeling of prolapse exercise, heavy lifting    Skin Integrity Intact;Erthema    Scar Well healed;Painful;Restricted    Pelvic Floor Internal Exam Patient confirms identification and approves PT to assess pelvic floor and treatment    Exam Type Vaginal    Palpation tender in the perineal body, post. introitus, decreased mobility of the left side of introitus, and post. introitus    Strength weak squeeze, no lift                      PT Education - 12/27/20 1219     Education Details Access Code: [email or web address]; education on using coconut oil on the vulvar area and perineal massage    Person(s) Educated Patient    Methods Explanation;Demonstration;Verbal cues;Handout    Comprehension Returned demonstration;Verbalized understanding              PT Short Term Goals - 12/27/20 1551       PT SHORT TERM GOAL #1   Title independent with initial HEP for core and pelvic floor    Time 4    Period Weeks    Status New    Target Date 01/24/21      PT SHORT TERM GOAL #2   Title ability to contract the lower and upper abdominals togther and equally    Time 4    Period Weeks    Status New    Target Date 01/24/21      PT SHORT TERM GOAL #3   Title understand what bladder irritants are and how they affect the bladder    Time 4    Period Weeks    Status New    Target Date 01/24/21      PT SHORT TERM GOAL #4   Title --    Time --    Period --    Status --    Target Date --      PT SHORT TERM GOAL #5   Title --    Time --    Period --    Status --    Target Date --               PT Long Term Goals - 12/27/20  1554       PT LONG TERM GOAL #1   Title independent with advanced HEP for pelvic floor and core    Time 12    Period Weeks    Status New    Target Date 03/21/21      PT LONG TERM GOAL #2   Title urinary leakage with activity decreased >/= 75% due to improve abdominal and pelvic floor coordination     Time 12    Period Weeks    Status New    Target Date 03/21/21      PT LONG TERM GOAL #3   Title able to have a bowel movement without straining every 2-3 days type 3-4 due to imporved coordination of the pelvic floor    Time 12    Period Weeks    Status New    Target Date 03/21/21      PT LONG TERM GOAL #4   Title ability to have vaginal penetrative sex with 0-1/10 pain due to improved lengtheing of the introitus and perineal body    Time 12    Period Weeks    Status New    Target Date 03/21/21      PT LONG TERM GOAL #5   Title diastasis rect decreased >/= 1-2 fingers widthe due to imporved abdominal resistance and mobility of the tissue for contraction    Time 12    Period Weeks    Status New    Target Date 03/21/21                    Plan - 12/27/20 1219     Clinical Impression Statement Patient is a 25 year old female with urinary leakage since she had her last child on 09/14/2020 vaginally. Patient reports pain with initial vaginal penetration at 7/10 and is better with lubricant. Patient is presently breast feeding and has vaginal dryness. She will leak urine with a strong urge, cough, sneeze, laugh, bending, jumping, HITT exercise and high knee running. She ahs a falling out feeling with exercise. Pelvic floor strength is 2/5 with increased tightness on the left intoriuts and poserior vaginal canal. she ahs decreased movement of the perineal body and restrictions from her grade 2 tear with her first child. she is not able to have a full bowel movement and it is a Type 3 every so many days. Patient does use a stool with her feet on it to make it easier to have a bowel movement. Patient will hyperextned her lumbar spine during a squat. Her left ilium is rotated posteriorly. Her lumbar extension decreased by 25%. She will tighten her upper abdominals nad bulge her lower abdominals with contraction. Diastasis REcti 3 finger width above her umbilicus and belw is 2 fingers  width. PFIQ-7 IS 29, UIQ-7 IS 19, CRAIQ-7 IS 5 AND POPIQ-7 IS 5. Patient will benefit from skilled therapy to improve pelvic floor coordinaiton to reduce leakage with activity and pain with penile penetration of the vagina.    Personal Factors and Comorbidities Sex;Fitness    Examination-Activity Limitations Bend;Caring for Others;Carry;Continence;Lift;Toileting    Stability/Clinical Decision Making Stable/Uncomplicated    Clinical Decision Making Low    Rehab Potential Excellent    PT Frequency 1x / week    PT Duration 12 weeks    PT Treatment/Interventions ADLs/Self Care Home Management;Biofeedback;Cryotherapy;Electrical Stimulation;Moist Heat;Therapeutic activities;Therapeutic exercise;Neuromuscular re-education;Patient/family education;Manual techniques;Scar mobilization;Dry needling;Spinal Manipulations;Joint Manipulations    PT Next Visit Plan correct pelvis, work on the diastasis with  manual work, tape the diastasis, engagement of the abdominals and improve rib mobiltiy; manual work posterior introitus and left pelvic floor    PT Home Exercise Plan Access Code: [email or web address]    Consulted and Agree with Plan of Care Patient             Patient will benefit from skilled therapeutic intervention in order to improve the following deficits and impairments:  Decreased endurance, Decreased scar mobility, Decreased activity tolerance, Decreased strength, Increased fascial restricitons, Pain, Decreased mobility, Increased muscle spasms, Decreased range of motion, Decreased coordination  Visit Diagnosis: Muscle weakness (generalized) - Plan: PT plan of care cert/re-cert  Cramp and spasm - Plan: PT plan of care cert/re-cert     Problem List Patient Active Problem List   Diagnosis Date Noted   High priority for COVID-19 virus vaccination 01/26/2020    Eulis Foster, PT 12/27/20 4:01 PM  [facility] [address] [place], [place],  [address] Phone: [phone]   Fax:  [phone]  Name: Lindsay Phillips MRN: [id number] Date of Birth: 05-09-1996

## 2021-01-03 ENCOUNTER — Encounter: Payer: Medicaid Other | Admitting: Physical Therapy

## 2021-01-03 ENCOUNTER — Telehealth: Payer: Self-pay | Admitting: Physical Therapy

## 2021-01-03 NOTE — Telephone Encounter (Signed)
Called patient about her missed appointment today at 1530. Left a message. Let her know about our attendance policy.  Eulis Foster, PT @8 /01/2021@ 3:36 PM

## 2021-01-10 ENCOUNTER — Encounter: Payer: Medicaid Other | Admitting: Physical Therapy

## 2021-01-10 ENCOUNTER — Telehealth: Payer: Self-pay | Admitting: Physical Therapy

## 2021-01-10 NOTE — Telephone Encounter (Signed)
Called patient to go over the no-show and cancellation policy. She is aware of it. She understands how to look at my chart for her appointments.  Eulis Foster, PT @8 /16/2022@ 3:26 PM

## 2021-01-13 DIAGNOSIS — R42 Dizziness and giddiness: Secondary | ICD-10-CM | POA: Diagnosis not present

## 2021-01-13 DIAGNOSIS — R110 Nausea: Secondary | ICD-10-CM | POA: Diagnosis not present

## 2021-01-17 ENCOUNTER — Encounter: Payer: Self-pay | Admitting: Physical Therapy

## 2021-01-17 ENCOUNTER — Other Ambulatory Visit: Payer: Self-pay

## 2021-01-17 ENCOUNTER — Encounter: Payer: Medicaid Other | Admitting: Physical Therapy

## 2021-01-17 DIAGNOSIS — R252 Cramp and spasm: Secondary | ICD-10-CM

## 2021-01-17 DIAGNOSIS — M6281 Muscle weakness (generalized): Secondary | ICD-10-CM

## 2021-01-17 DIAGNOSIS — N393 Stress incontinence (female) (male): Secondary | ICD-10-CM | POA: Diagnosis not present

## 2021-01-17 NOTE — Patient Instructions (Signed)
Access Code: [email or web address] URL: [email or web address] Date: 01/17/2021 Prepared by: Eulis Foster  Program Notes put coconut oil on the vulvar area daily massage the perineal body and sides of the vaginal 3 times per weeks. Stroke from 1:00 to 6: 00 20x then 11:00 to 6:00 20x, then move finger on the back of the vagina to loosen the tissue.  massage the perineal body   Exercises Supine Transversus Abdominis Bracing - Hands on Stomach - 2 x daily - 7 x weekly - 1 sets - 10 reps Quadruped Transversus Abdominis Bracing - 1 x daily - 7 x weekly - 3 sets - 10 reps Diaphragmatic Breathing at 90/90 Supported - 1 x daily - 7 x weekly - 1 sets - 10 reps Standing Hip Hinge - 1 x daily - 4 x weekly - 1 sets - 10 reps Seated Transversus Abdominis Bracing - 1 x daily - 7 x weekly - 2 sets - 10 reps Eulis Foster, PT [facility] [address], [address] [place], [place] [address] W: [phone] [name]

## 2021-01-17 NOTE — Therapy (Signed)
[facility] [address] [place], [place], [address] Phone: [phone]   Fax:  [phone]  Physical Therapy Treatment  Patient Details  Name: Lindsay Phillips MRN: [id number] Date of Birth: Apr 02, 1996 Referring Provider (PT): Vonzella Nipple, PA-C   Encounter Date: 01/17/2021   PT End of Session - 01/17/21 1127     Visit Number 2    Date for PT Re-Evaluation 03/21/21    Authorization Type [place] healthy blue    PT Start Time 1030    PT Stop Time 1118    PT Time Calculation (min) 48 min    Activity Tolerance Patient tolerated treatment well;No increased pain    Behavior During Therapy WFL for tasks assessed/performed             Past Medical History:  Diagnosis Date   Blood transfusion without reported diagnosis    Medical history non-contributory     Past Surgical History:  Procedure Laterality Date   INDUCED ABORTION      There were no vitals filed for this visit.   Subjective Assessment - 01/17/21 1035     Subjective I felt good after my last visit. I have been using the coconut oil and helps with dyness. I am not putting as much pressure in the pelvic floor. The leakage is reducing. No pain with intercourse.    Patient Stated Goals strengthen her pelvic floor    Currently in Pain? No/denies                [facility] PT Assessment - 01/17/21 0001       Palpation   SI assessment  ASIS are equal                           [facility] Adult PT Treatment/Exercise - 01/17/21 0001       Neuro Re-ed    Neuro Re-ed Details  diaphragmatic breathing in supine, quadruped and sitting to expand the lower rib cage and pelvic floor      Lumbar Exercises: Supine   Ab Set 10 reps;5 seconds    Other Supine Lumbar Exercises supine with diaphgram atic breathing and legs on stool to relax the pelvic floor and arms at sides to reduce the hinging of the thoracic lumbar junction      Lumbar Exercises:  Quadruped   Other Quadruped Lumbar Exercises hip hinging in quadruped keeping spinal neutral instead of flexing in the lumbar spine      Knee/Hip Exercises: Standing   Other Standing Knee Exercises standing with hip hingeing and against wall to help keep spinal neutral      Manual Therapy   Manual Therapy Soft tissue mobilization;Myofascial release    Soft tissue mobilization tissue mobilization to the diaphgram, along the right quadratus in sidely, along the thoracic lumbar paraspinals with patient in supine and she is doing the diaphragmatic breathing; quadruped pulling the tissue forward from the back to the abdomen    Myofascial Release using the suction cup to free the fascia on the back; tissue rolling on the abdomen; release of the upper midline of the abdominals with pulling up and moving through the fascial restriction; fascial release in the lower abdominals going through the 3 planes                    PT Education - 01/17/21 1121     Education Details Access Code: [email or web address]  Person(s) Educated Patient    Methods Explanation;Demonstration;Verbal cues;Handout    Comprehension Returned demonstration;Verbalized understanding              PT Short Term Goals - 12/27/20 1551       PT SHORT TERM GOAL #1   Title independent with initial HEP for core and pelvic floor    Time 4    Period Weeks    Status New    Target Date 01/24/21      PT SHORT TERM GOAL #2   Title ability to contract the lower and upper abdominals togther and equally    Time 4    Period Weeks    Status New    Target Date 01/24/21      PT SHORT TERM GOAL #3   Title understand what bladder irritants are and how they affect the bladder    Time 4    Period Weeks    Status New    Target Date 01/24/21      PT SHORT TERM GOAL #4   Title --    Time --    Period --    Status --    Target Date --      PT SHORT TERM GOAL #5   Title --    Time --    Period --    Status --    Target Date  --               PT Long Term Goals - 12/27/20 1554       PT LONG TERM GOAL #1   Title independent with advanced HEP for pelvic floor and core    Time 12    Period Weeks    Status New    Target Date 03/21/21      PT LONG TERM GOAL #2   Title urinary leakage with activity decreased >/= 75% due to improve abdominal and pelvic floor coordination    Time 12    Period Weeks    Status New    Target Date 03/21/21      PT LONG TERM GOAL #3   Title able to have a bowel movement without straining every 2-3 days type 3-4 due to imporved coordination of the pelvic floor    Time 12    Period Weeks    Status New    Target Date 03/21/21      PT LONG TERM GOAL #4   Title ability to have vaginal penetrative sex with 0-1/10 pain due to improved lengtheing of the introitus and perineal body    Time 12    Period Weeks    Status New    Target Date 03/21/21      PT LONG TERM GOAL #5   Title diastasis rect decreased >/= 1-2 fingers widthe due to imporved abdominal resistance and mobility of the tissue for contraction    Time 12    Period Weeks    Status New    Target Date 03/21/21                   Plan - 01/17/21 1128     Clinical Impression Statement Patient has been using coconut oil in the vaginal area and notices less dryness. She is able to have penile penetration vaginally without pain. Patient has learned how to perform diaphragmatic breathing without hinging at the lumbar thoracic junction after the tissue was released. She is now able to engage the lower abdominals without bulging  them out. Patient understands how to keep spinal nuetral wiht hip hinging. Patient will benefit from skilled therapy to improve pelvic floor coordination to reduce leakage with activity.    Personal Factors and Comorbidities Sex;Fitness    Examination-Activity Limitations Bend;Caring for Others;Carry;Continence;Lift;Toileting    Stability/Clinical Decision Making Stable/Uncomplicated     Rehab Potential Excellent    PT Frequency 1x / week    PT Duration 12 weeks    PT Treatment/Interventions ADLs/Self Care Home Management;Biofeedback;Cryotherapy;Electrical Stimulation;Moist Heat;Therapeutic activities;Therapeutic exercise;Neuromuscular re-education;Patient/family education;Manual techniques;Scar mobilization;Dry needling;Spinal Manipulations;Joint Manipulations    PT Next Visit Plan check on diastasis; add arms and legs to abdominals, hip hike in stanidng with pelvic floor contraction , 1/2 knees moving band with band on leg; contract pelvic floor with quick and 5 second hold; go over bladder irritants    PT Home Exercise Plan Access Code: [email or web address]    Recommended Other Services MD signed inital note    Consulted and Agree with Plan of Care Patient             Patient will benefit from skilled therapeutic intervention in order to improve the following deficits and impairments:  Decreased endurance, Decreased scar mobility, Decreased activity tolerance, Decreased strength, Increased fascial restricitons, Pain, Decreased mobility, Increased muscle spasms, Decreased range of motion, Decreased coordination  Visit Diagnosis: Muscle weakness (generalized)  Cramp and spasm     Problem List Patient Active Problem List   Diagnosis Date Noted   High priority for COVID-19 virus vaccination 01/26/2020    Eulis Foster, PT 01/17/21 11:34 AM  [facility] [address], [address] [place], [place], [address] Phone: [phone]   Fax:  [phone]  Name: Allycia Pitz MRN: [id number] Date of Birth: Jul 03, 1995

## 2021-01-26 ENCOUNTER — Other Ambulatory Visit: Payer: Self-pay

## 2021-01-26 ENCOUNTER — Encounter: Payer: Self-pay | Admitting: Physical Therapy

## 2021-01-26 ENCOUNTER — Encounter: Payer: Medicaid Other | Attending: Medical | Admitting: Physical Therapy

## 2021-01-26 DIAGNOSIS — R252 Cramp and spasm: Secondary | ICD-10-CM | POA: Diagnosis not present

## 2021-01-26 DIAGNOSIS — M6281 Muscle weakness (generalized): Secondary | ICD-10-CM | POA: Insufficient documentation

## 2021-01-26 NOTE — Patient Instructions (Signed)
Access Code: [email or web address] URL: [email or web address] Date: 01/26/2021 Prepared by: Eulis Foster  Program Notes put coconut oil on the vulvar area daily massage the perineal body and sides of the vaginal 3 times per weeks. Stroke from 1:00 to 6: 00 20x then 11:00 to 6:00 20x, then move finger on the back of the vagina to loosen the tissue.  massage the perineal body   Exercises Diaphragmatic Breathing at 90/90 Supported - 1 x daily - 7 x weekly - 1 sets - 10 reps Standing Hip Hinge - 1 x daily - 4 x weekly - 1 sets - 10 reps Supine March - 1 x daily - 4 x weekly - 2 sets - 10 reps Quadruped Exhale with Pelvic Floor Contraction and Arm Raise - 1 x daily - 4 x weekly - 2 sets - 10 reps Quadruped Leg Lifts - 1 x daily - 4 x weekly - 2 sets - 10 reps Quadruped Pelvic Floor Contraction with Opposite Arm and Leg Lift - 1 x daily - 4 x weekly - 2 sets - 10 reps Plank with Elbows on Table - 1 x daily - 4 x weekly - 1 sets - 3 reps - 30 sec hold Single Leg Running Balance - 1 x daily - 4 x weekly - 2 sets - 10 reps Jumping with Pelvic Floor Contraction - 1 x daily - 7 x weekly - 1 sets - 10 reps Lateral Single Leg Lunge Jumps - 1 x daily - 7 x weekly - 2 sets - 10 reps Eulis Foster, PT [facility] [address], [address] [place], [place] [address] W: [phone] [email or web address]

## 2021-01-26 NOTE — Therapy (Addendum)
[place] at [facility] [address], [place], [place], [address] Phone: [phone]   Fax:  [phone]  Physical Therapy Treatment  Patient Details  Name: Lindsay Phillips MRN: [id number] Date of Birth: 15-Nov-1995 Referring Provider (PT): Kerry Hough, PA-C   Encounter Date: 01/26/2021   PT End of Session - 01/26/21 1548     Visit Number 3    Date for PT Re-Evaluation 03/21/21    Authorization Type [employer]    Authorization Time Period 01/02/2021-02/24/2021    Authorization - Visit Number 2    Authorization - Number of Visits 8    PT Start Time 1500    PT Stop Time 1540    PT Time Calculation (min) 40 min    Activity Tolerance Patient tolerated treatment well;No increased pain    Behavior During Therapy WFL for tasks assessed/performed             Past Medical History:  Diagnosis Date   Blood transfusion without reported diagnosis    Medical history non-contributory     Past Surgical History:  Procedure Laterality Date   INDUCED ABORTION      There were no vitals filed for this visit.   Subjective Assessment - 01/26/21 1458     Subjective I felt good afterlast visit. Patient can hold her bladder for 4 hours. Since last visit there is no urinary leakage. Patient has been running. No leaking with jogging.    Patient Stated Goals strengthen her pelvic floor                [facility] PT Assessment - 01/26/21 0001       Assessment   Medical Diagnosis N39.3 Female stress incontinence    Referring Provider (PT) Kerry Hough, PA-C    Onset Date/Surgical Date 09/14/20    Prior Therapy none      Precautions   Precautions None      Restrictions   Weight Bearing Restrictions No      Home Environment   Living Environment Private residence      Prior Function   Level of Independence Independent    Vocation Full time employment    Vocation Requirements speech therapist    Leisure workout daily with  [employer] app, hit workouts      Cognition   Overall Cognitive Status Within Functional Limits for tasks assessed      Observation/Other Assessments   Focus on Therapeutic Outcomes (FOTO)  PFIQ-7 29; UIQ-7 19; CRAIQ-7 5; POPIQ-7 5      AROM   Lumbar Extension decreased by 25%      Strength   Overall Strength Comments able to tighten the abdominals without bulging the lower abdominals      Palpation   SI assessment  ASIS are equal                        Pelvic Floor Special Questions - 01/26/21 0001     Diastasis Recti 2 finger width above the umbilicus and 1 fingers width below    Activities that cause leaking Coughing;Sneezing;Laughing   jumping              [facility] Adult PT Treatment/Exercise - 01/26/21 0001       Neuro Re-ed    Neuro Re-ed Details  diaphragmatic breathing in supine with 360 degree expansion of the lower rib cage      Lumbar Exercises: Supine   Bent Knee Raise  20 reps;1 second   with pelvic floor contraction, 10x each leg   Bent Knee Raise Limitations breath in with leg going up and out with leg going down      Lumbar Exercises: Quadruped   Single Arm Raise Right;Left;10 reps;1 second    Single Arm Raise Weights (lbs) contract pelvic floor and abdominals    Straight Leg Raise 10 reps;1 second   right, left   Straight Leg Raises Limitations contract the pelvic floor    Other Quadruped Lumbar Exercises laying on the ball and breathing into the back    Other Quadruped Lumbar Exercises Plank on mat then gave progression to lower surface; PLANK on mat with bringing knees to ches and tightening the correct muscles      Knee/Hip Exercises: Standing   Other Standing Knee Exercises standing with holding the door jam and squat with breathing into the back    Other Standing Knee Exercises standing jump with pelvic floor contraction 10x, single leg run stance 10x each leg with pelvic floor and abdominal contraction                    PT  Education - 01/26/21 1547     Education Details Access Code: [email or web address]    Person(s) Educated Patient    Methods Explanation;Demonstration;Verbal cues;Handout    Comprehension Returned demonstration;Verbalized understanding              PT Short Term Goals - 01/26/21 1555       PT SHORT TERM GOAL #1   Title independent with initial HEP for core and pelvic floor    Time 4    Period Weeks    Status Achieved      PT SHORT TERM GOAL #2   Title ability to contract the lower and upper abdominals togther and equally    Time 4    Period Weeks    Status Achieved      PT SHORT TERM GOAL #3   Title understand what bladder irritants are and how they affect the bladder    Time 4    Period Weeks    Status New               PT Long Term Goals - 01/26/21 1459       PT LONG TERM GOAL #3   Title able to have a bowel movement without straining every 2-3 days type 3-4 due to imporved coordination of the pelvic floor    Time 12    Period Weeks    Status Achieved      PT LONG TERM GOAL #4   Title ability to have vaginal penetrative sex with 0-1/10 pain due to improved lengtheing of the introitus and perineal body    Time 12    Period Weeks    Status Achieved                   Plan - 01/26/21 1549     Clinical Impression Statement Patient is able to hold her bladder for 4  hours and does not have the urgency to get to the bathroom. She is able to walk to the bathroom without leaking urine. Patient has not vaginal pain with penile penetration. She is not straining to have a bowel movement every 2-3 days and has Type 4 stool. Diastasis is now 1 finger width below the umbilicus and 2 finger width above the umbilicus with good tension. Patient is not leaking with bending.  She will leak with jumping, coughing, laughing, and sneezing. Patient is able to contract her abdominals without bulging the lower abdominals out. Her pelvis in correct alignment. Patient will benefit from  skilled therapy to reduce urinary leakage and improve core and pelvic floor strength.    Personal Factors and Comorbidities Sex;Fitness    Examination-Activity Limitations Bend;Caring for Others;Carry;Continence;Lift;Toileting    Stability/Clinical Decision Making Stable/Uncomplicated    Rehab Potential Excellent    PT Frequency 1x / week    PT Duration 12 weeks    PT Treatment/Interventions ADLs/Self Care Home Management;Biofeedback;Cryotherapy;Electrical Stimulation;Moist Heat;Therapeutic activities;Therapeutic exercise;Neuromuscular re-education;Patient/family education;Manual techniques;Scar mobilization;Dry needling;Spinal Manipulations;Joint Manipulations    PT Next Visit Plan go over bladder irritants; exercises for cross body strength like side plank and pull bands across body, if doing well then discharge    PT Home Exercise Plan Access Code: [email or web address]    Recommended Other Services sent in Medicaid new auth    Consulted and Agree with Plan of Care Patient             Patient will benefit from skilled therapeutic intervention in order to improve the following deficits and impairments:  Decreased endurance, Decreased scar mobility, Decreased activity tolerance, Decreased strength, Increased fascial restricitons, Pain, Decreased mobility, Increased muscle spasms, Decreased range of motion, Decreased coordination  Visit Diagnosis: Muscle weakness (generalized)  Cramp and spasm     Problem List Patient Active Problem List   Diagnosis Date Noted   High priority for COVID-19 virus vaccination 01/26/2020    Earlie Counts, PT 01/26/21 4:00 PM  [place] at [facility] [address] [place], [place], [address] Phone: [phone]   Fax:  [phone]  Name: Lindsay Phillips MRN: [id number] Date of Birth: [birth year]   PHYSICAL THERAPY DISCHARGE SUMMARY  Visits from Start of Care: 3  Current functional level related to goals /  functional outcomes: See above. Patient called and wanted to be discharged.    Remaining deficits: See above.    Education / Equipment: See above.    Patient agrees to discharge. Patient goals were partially met. Patient is being discharged due to being pleased with the current functional level. Thank you for the referral. Earlie Counts, PT 04/04/21 5:30 PM

## 2021-03-03 ENCOUNTER — Telehealth: Payer: Self-pay

## 2021-03-03 MED ORDER — AMOXICILLIN 875 MG PO TABS: 875.0000 mg | ORAL_TABLET | Freq: Two times a day (BID) | ORAL | 0 refills | Status: DC

## 2021-03-03 NOTE — Telephone Encounter (Signed)
Patient called and made aware that Dr. Adrian Blackwater states she can take amoxicillin twice at day for seven days. Patient is to call on Monday if her symptoms are not being relieved and she will need to come in for evaluation. Patient states understanding. Armandina Stammer RN

## 2021-03-03 NOTE — Telephone Encounter (Signed)
-----   Message from Marti Sleigh, [place] sent at 03/03/2021  8:09 AM EDT ----- Has breast mastitis due to breast feeding.  Requesting antibiotic.  Please call patient.

## 2021-03-23 ENCOUNTER — Encounter: Payer: Medicaid Other | Admitting: Physical Therapy

## 2021-04-06 ENCOUNTER — Encounter: Payer: Medicaid Other | Admitting: Physical Therapy

## 2021-04-12 ENCOUNTER — Other Ambulatory Visit: Payer: Self-pay | Admitting: Family Medicine

## 2021-04-12 ENCOUNTER — Other Ambulatory Visit: Payer: Self-pay

## 2021-04-12 DIAGNOSIS — B379 Candidiasis, unspecified: Secondary | ICD-10-CM

## 2021-04-12 MED ORDER — FLUCONAZOLE 150 MG PO TABS: ORAL_TABLET | ORAL | 1 refills | Status: DC

## 2021-04-12 MED ORDER — AMOXICILLIN 875 MG PO TABS: 875.0000 mg | ORAL_TABLET | Freq: Two times a day (BID) | ORAL | 0 refills | Status: AC

## 2021-04-12 NOTE — Progress Notes (Signed)
Patient calling in with red area on breast with pain. Has had mastitis before and it feels the same.  Amoxicillin 875mg  BID sent in to pharmacy.  , DO 04/12/2021 8:22 AM

## 2021-05-28 NOTE — L&D Delivery Note (Cosign Needed Addendum)
OB/GYN Faculty Practice Delivery Note  Lindsay Phillips is a 26 y.o. [id number] s/p SVD at [redacted]w[redacted]d. She was admitted for SOL.   ROM: 0h 85m with clear fluid GBS Status: negative Maximum Maternal Temperature: 97.29F  Labor Progress: Patient progressed from 4, 50, -2  to 5.5, 90, -2 with SROM. Then progressed to 10, 100, +2.  Delivery Date/Time: [phone] Delivery: Called to room and patient was complete and pushing. Head delivered LOA. No nuchal cord present around neck. Shoulder and body delivered in usual fashion. Infant with spontaneous cry, placed on mother's abdomen, dried and stimulated. Cord clamped x 2 after 1-minute delay, and cut by FOB. Cord blood drawn. Placenta delivered spontaneously, intact, with 3-vessel cord. Fundus firm with massage and Pitocin. Labia, perineum, vagina, and cervix inspected, no laceration found.  Placenta: Intact, 3 vessels Complications: none Lacerations: none EBL: 0 Analgesia: Natural  Postpartum Planning [x]  transfer orders to MB [x]  discharge summary started & shared [ ]  message to sent to schedule follow-up  [x]  lists updated  Infant: female  APGARs 39  9g  , MD, PGY-1 [facility] 1:05 AM 02/06/2022   _____________ GME ATTESTATION:  Evaluation and management procedures were performed by the [facility] Resident under my supervision. I was immediately available for direct supervision, assistance and direction throughout this encounter.  I also confirm that I have verified the information documented in the resident's note, and that I have also personally reperformed the pertinent components of the physical exam and all of the medical decision making activities.  I have also made any necessary editorial changes.  10, DO OB Fellow, Faculty [facility] 02/06/2022 4:10 AM

## 2021-06-07 ENCOUNTER — Ambulatory Visit: Payer: Medicaid Other

## 2021-06-08 ENCOUNTER — Other Ambulatory Visit: Payer: Self-pay

## 2021-06-08 ENCOUNTER — Ambulatory Visit (INDEPENDENT_AMBULATORY_CARE_PROVIDER_SITE_OTHER): Payer: Medicaid Other

## 2021-06-08 VITALS — BP 120/59 | HR 69 | Wt 178.0 lb

## 2021-06-08 DIAGNOSIS — Z3201 Encounter for pregnancy test, result positive: Secondary | ICD-10-CM

## 2021-06-08 NOTE — Progress Notes (Signed)
Patient was assessed and managed by nursing staff during this encounter. I have reviewed the chart and agree with the documentation and plan. I have also made any necessary editorial changes.  Mora Bellman, MD 06/08/2021 11:00 AM

## 2021-06-08 NOTE — Progress Notes (Signed)
Pt presents for UPT. Pt states LMP is 05/05/21. UPT positive. Pt advised to schedule NOB appt in about 5 weeks. Pt unsure what she wants to do.Understanding was voiced Lindsay Phillips, CMA

## 2021-06-24 ENCOUNTER — Other Ambulatory Visit: Payer: Self-pay | Admitting: Advanced Practice Midwife

## 2021-06-24 DIAGNOSIS — O26899 Other specified pregnancy related conditions, unspecified trimester: Secondary | ICD-10-CM

## 2021-06-24 DIAGNOSIS — Z6791 Unspecified blood type, Rh negative: Secondary | ICD-10-CM

## 2021-06-24 DIAGNOSIS — Z3A29 29 weeks gestation of pregnancy: Secondary | ICD-10-CM

## 2021-06-24 DIAGNOSIS — Z3480 Encounter for supervision of other normal pregnancy, unspecified trimester: Secondary | ICD-10-CM

## 2021-07-11 ENCOUNTER — Ambulatory Visit (INDEPENDENT_AMBULATORY_CARE_PROVIDER_SITE_OTHER): Payer: Medicaid Other | Admitting: Advanced Practice Midwife

## 2021-07-11 ENCOUNTER — Encounter: Payer: Self-pay | Admitting: Advanced Practice Midwife

## 2021-07-11 ENCOUNTER — Encounter: Payer: Self-pay | Admitting: General Practice

## 2021-07-11 ENCOUNTER — Other Ambulatory Visit (HOSPITAL_COMMUNITY)
Admission: RE | Admit: 2021-07-11 | Discharge: 2021-07-11 | Disposition: A | Discharge status: Home / Self Care | Payer: Medicaid Other | Source: Ambulatory Visit | Attending: Advanced Practice Midwife | Admitting: Advanced Practice Midwife

## 2021-07-11 ENCOUNTER — Other Ambulatory Visit: Payer: Self-pay

## 2021-07-11 VITALS — BP 109/61 | HR 80 | Wt 171.0 lb

## 2021-07-11 DIAGNOSIS — O09891 Supervision of other high risk pregnancies, first trimester: Secondary | ICD-10-CM | POA: Diagnosis not present

## 2021-07-11 DIAGNOSIS — O26891 Other specified pregnancy related conditions, first trimester: Secondary | ICD-10-CM | POA: Diagnosis not present

## 2021-07-11 DIAGNOSIS — Z3480 Encounter for supervision of other normal pregnancy, unspecified trimester: Secondary | ICD-10-CM | POA: Diagnosis not present

## 2021-07-11 DIAGNOSIS — Z3A08 8 weeks gestation of pregnancy: Secondary | ICD-10-CM | POA: Diagnosis not present

## 2021-07-11 DIAGNOSIS — O26899 Other specified pregnancy related conditions, unspecified trimester: Secondary | ICD-10-CM

## 2021-07-11 DIAGNOSIS — O09899 Supervision of other high risk pregnancies, unspecified trimester: Secondary | ICD-10-CM | POA: Diagnosis not present

## 2021-07-11 DIAGNOSIS — Z6791 Unspecified blood type, Rh negative: Secondary | ICD-10-CM

## 2021-07-11 MED ORDER — DOXYLAMINE-PYRIDOXINE 10-10 MG PO TBEC: 2.0000 | DELAYED_RELEASE_TABLET | Freq: Every evening | ORAL | 5 refills | Status: DC | PRN

## 2021-07-11 NOTE — Progress Notes (Signed)
INITIAL OBSTETRICAL VISIT Patient name: Lindsay Phillips MRN [id number]  Date of birth: Jan 06, 1996 Chief Complaint:   Initial Prenatal Visit  History of Present Illness:   Lindsay Phillips is a 26 y.o. G57P2012 African American female at [redacted]w[redacted]d by [place] at 8 weeks with an Estimated Date of Delivery: 02/20/22 being seen today for her initial obstetrical visit.   Her obstetrical history is significant for  Rh Negative, conceived on POPs, Closely spaced pregnancy .   Today she reports nausea.  Depression screen [facility] 2/9 07/11/2021  Decreased Interest 0  Down, Depressed, Hopeless 0  PHQ - 2 Score 0  Altered sleeping 0  Tired, decreased energy 1  Change in appetite 1  Feeling bad or failure about yourself  0  Trouble concentrating 0  Moving slowly or fidgety/restless 0  Suicidal thoughts 0  PHQ-9 Score 2    Patient's last menstrual period was 05/05/2021 (exact date). Last pap 2021. Results were: normal Review of Systems:   Pertinent items are noted in HPI Denies cramping/contractions, leakage of fluid, vaginal bleeding, abnormal vaginal discharge w/ itching/odor/irritation, headaches, visual changes, shortness of breath, chest pain, abdominal pain, severe nausea/vomiting, or problems with urination or bowel movements unless otherwise stated above.  Pertinent History Reviewed:  Reviewed past medical,surgical, social, obstetrical and family history.  Reviewed problem list, medications and allergies. OB History  Gravida Para Term Preterm AB Living  4 2 2   1 2   SAB IAB Ectopic Multiple Live Births    1   0 2    # Outcome Date GA Lbr Len/2nd Weight Sex Delivery Anes PTL Lv  4 Current           3 Term 09/14/20 [redacted]w[redacted]d  6 lb 12.6 oz (3.079 kg) M Vag-Spont None  LIV  2 Term 12/03/18 [redacted]w[redacted]d 06:48 / 00:27 6 lb 8.8 oz (2.97 kg) M Vag-Spont None  LIV     Birth Comments: "bad tear resulting in blood transfusion"  1 IAB 06/2016           Physical Assessment:   Vitals:   07/11/21 0910  BP: 109/61  Pulse:  80  Weight: 171 lb (77.6 kg)  Body mass index is 27.6 kg/m.       Physical Examination:  General appearance - well appearing, and in no distress  Mental status - alert, oriented to person, place, and time  Psych:  She has a normal mood and affect  Skin - warm and dry, normal color, no suspicious lesions noted  Chest - effort normal, all lung fields clear to auscultation bilaterally  Heart - normal rate and regular rhythm  Abdomen - soft, nontender  Extremities:  No swelling or varicosities noted  Pelvic - VULVA: normal appearing vulva with no masses, tenderness or lesions  VAGINA: normal appearing vagina with normal color and discharge, no lesions  CERVIX: normal appearing cervix without discharge or lesions, no CMT  Thin prep pap is not done    Assessment & Plan:  1) Low-Risk Pregnancy 07/13/21 at [redacted]w[redacted]d with an Estimated Date of Delivery: 02/20/22       Would like to try a waterbirth again  2) Initial OB visit  3) Rx Diclegis sent for nausea  4)   URI, remedies reviewed   Initial labs obtained Continue prenatal vitamins Reviewed n/v relief measures and warning s/s to report Reviewed recommended weight gain based on pre-gravid BMI Encouraged well-balanced diet Genetic & carrier screening discussed: requests Panorama, requests AFP Ultrasound discussed; fetal survey: requested  [employer] completed> form faxed if has or is planning to apply for medicaid The nature of [place] [facility] [employer] with multiple MDs and other Advanced Practice Providers was explained to patient; also emphasized that fellows, residents, and students are part of our team.  Indications for ASA therapy (per uptodate) One of the following: H/O preeclampsia, especially early onset/adverse outcome No Multifetal gestation No CHTN No T1DM or T2DM No Chronic kidney disease No Autoimmune disease (antiphospholipid syndrome, systemic lupus erythematosus) No  OR Two or more of the  following: Nulliparity No Obesity (BMI>30 kg/m2) No Family h/o preeclampsia in mother or sister No Age ?35 years No Sociodemographic characteristics (African American race, low socioeconomic level) Yes Personal risk factors (eg, previous pregnancy w/ LBW or SGA, previous adverse pregnancy outcome [eg, stillbirth], interval >10 years between pregnancies) No  Indications for early A1C (per uptodate) BMI >=25 (>=23 in Asian women) AND one of the following GDM in a previous pregnancy No Previous A1C?5.7, impaired glucose tolerance, or impaired fasting glucose on previous testing No First-degree relative with diabetes No High-risk race/ethnicity (eg, African American, Latino, Native American, Asian American, Pacific Islander) Yes History of cardiovascular disease No HTN or on therapy for hypertension No HDL cholesterol level <35 mg/dL (2.11 mmol/L) and/or a triglyceride level >250 mg/dL (9.41 mmol/L) No PCOS No Physical inactivity No Other clinical condition associated with insulin resistance (eg, severe obesity, acanthosis nigricans) No Previous birth of an infant weighing ?4000 g No Previous stillbirth of unknown cause No >= 40yo No  Follow-up: 4 weeks  Orders Placed This Encounter  Procedures   Culture, OB Urine   [place] MFM OB DETAIL +14 WK   CBC/D/Plt+RPR+Rh+ABO+RubIgG...    Wynelle Bourgeois CNM, [facility] 07/11/2021 10:17 AM

## 2021-07-11 NOTE — Progress Notes (Signed)
DATING AND VIABILITY SONOGRAM   Lindsay Phillips is a 26 y.o. year old G61P2012 with LMP Patient's last menstrual period was 05/05/2021 (exact date). which would correlate to  [redacted]w[redacted]d weeks gestation.  Lindsay Phillips has regular menstrual cycles.   Lindsay Phillips is here today for a confirmatory initial sonogram.    GESTATION: [place]INGLE[place]     FETAL ACTIVITY:          Heart rate         181 bpm          The fetus is active.    ADNEXA: The ovaries are normal.   GESTATIONAL AGE AND  BIOMETRICS:  Gestational criteria: Estimated Date of Delivery: 02/20/22 by early ultrasound now at [redacted]w[redacted]d  Previous Scans:0      CROWN RUMP LENGTH           1.53 cm         8-0 weeks        1.59 cm          8-0 weeks             1.59 cm         8-0 weeks                                                                       AVERAGE EGA(BY THIS SCAN):  8-0 weeks  WORKING EDD( early ultrasound ):  02-20-2022     TECHNICIAN COMMENTS:  Patient informed that the ultrasound is considered a limited obstetric ultrasound and is not intended to be a complete ultrasound exam.  Patient also informed that the ultrasound is not being completed with the intent of assessing for fetal or placental anomalies or any pelvic abnormalities. Explained that the purpose of today's ultrasound is to assess for fetal heart rate.  Patient acknowledges the purpose of the exam and the limitations of the study.    Armandina Stammer 07/11/2021 9:50 AM   Patient was assessed and managed by nursing staff during this encounter. I have reviewed the chart and agree with the documentation and plan. I have also made any necessary editorial changes.  Wynelle Bourgeois, CNM 07/11/2021 10:17 AM

## 2021-07-12 LAB — CBC/D/PLT+RPR+RH+ABO+RUBIGG...
Antibody Screen: NEGATIVE
Basophils Absolute: 0 10*3/uL (ref 0.0–0.2)
Basos: 0 %
EOS (ABSOLUTE): 0.1 10*3/uL (ref 0.0–0.4)
Eos: 1 %
HCV Ab: NONREACTIVE
HIV Screen 4th Generation wRfx: NONREACTIVE
Hematocrit: 39.6 % (ref 34.0–46.6)
Hemoglobin: 12.6 g/dL (ref 11.1–15.9)
Hepatitis B Surface Ag: NEGATIVE
Immature Grans (Abs): 0 10*3/uL (ref 0.0–0.1)
Immature Granulocytes: 0 %
Lymphocytes Absolute: 2 10*3/uL (ref 0.7–3.1)
Lymphs: 17 %
MCH: 27.9 pg (ref 26.6–33.0)
MCHC: 31.8 g/dL (ref 31.5–35.7)
MCV: 88 fL (ref 79–97)
Monocytes Absolute: 0.6 10*3/uL (ref 0.1–0.9)
Monocytes: 5 %
Neutrophils Absolute: 9 10*3/uL — ABNORMAL HIGH (ref 1.4–7.0)
Neutrophils: 77 %
Platelets: 267 10*3/uL (ref 150–450)
RBC: 4.51 x10E6/uL (ref 3.77–5.28)
RDW: 11.4 % — ABNORMAL LOW (ref 11.7–15.4)
RPR Ser Ql: NONREACTIVE
Rh Factor: NEGATIVE
Rubella Antibodies, IGG: 2.38 index (ref >0.99)
WBC: 11.7 10*3/uL — ABNORMAL HIGH (ref 3.4–10.8)

## 2021-07-12 LAB — HCV INTERPRETATION

## 2021-07-12 LAB — GC/CHLAMYDIA PROBE AMP (~~LOC~~) NOT AT ARMC
Chlamydia: NEGATIVE
Comment: NEGATIVE
Comment: NORMAL
Neisseria Gonorrhea: NEGATIVE

## 2021-07-13 LAB — CULTURE, OB URINE

## 2021-07-13 LAB — URINE CULTURE, OB REFLEX

## 2021-07-17 ENCOUNTER — Encounter: Payer: Self-pay | Admitting: Advanced Practice Midwife

## 2021-07-24 ENCOUNTER — Telehealth: Payer: Self-pay

## 2021-07-24 MED ORDER — ONDANSETRON 4 MG PO TBDP: 4.0000 mg | ORAL_TABLET | Freq: Three times a day (TID) | ORAL | 6 refills | Status: DC | PRN

## 2021-07-24 NOTE — Telephone Encounter (Signed)
Patient is [redacted]w[redacted]d complaining of nausea all day and vomiting 3-4 times a day. Patient is currently taking diclegis for two weeks and has not seen any improvement. Patient wondering if there is something else she could try. Will route to provider. Armandina Stammer RN

## 2021-07-24 NOTE — Telephone Encounter (Signed)
Filled Zofran.  Message to patient sent.

## 2021-07-26 ENCOUNTER — Ambulatory Visit (INDEPENDENT_AMBULATORY_CARE_PROVIDER_SITE_OTHER): Payer: Medicaid Other

## 2021-07-26 VITALS — BP 102/64 | HR 79 | Wt 171.0 lb

## 2021-07-26 DIAGNOSIS — R399 Unspecified symptoms and signs involving the genitourinary system: Secondary | ICD-10-CM

## 2021-07-26 LAB — POCT URINALYSIS DIPSTICK OB
Bilirubin, UA: NEGATIVE
Blood, UA: POSITIVE
Ketones, UA: NEGATIVE
Nitrite, UA: NEGATIVE
POC,PROTEIN,UA: NEGATIVE
Spec Grav, UA: 1.02 (ref 1.010–1.025)
Urobilinogen, UA: 0.2 E.U./dL
pH, UA: 8 (ref 5.0–8.0)

## 2021-07-26 MED ORDER — CEPHALEXIN 500 MG PO CAPS: 500.0000 mg | ORAL_CAPSULE | Freq: Four times a day (QID) | ORAL | 0 refills | Status: DC

## 2021-07-26 NOTE — Progress Notes (Signed)
SUBJECTIVE: Lindsay Phillips is a 26 y.o. female who complains of urinary frequency, urgency and dysuria x 1 day, without flank pain, fever, chills, or abnormal vaginal discharge or bleeding.  ? ?OBJECTIVE: Appears well, in no apparent distress.  Vital signs are normal. Urine dipstick shows positive for leukocytes.  ? ?ASSESSMENT: Dysuria ? ?PLAN: Treatment per orders.  Call or return to clinic prn if these symptoms worsen or fail to improve as anticipated.  ? ?Natasha Burda l Kevron Patella, CMA  ?

## 2021-07-28 LAB — CULTURE, OB URINE

## 2021-07-28 LAB — URINE CULTURE, OB REFLEX

## 2021-08-15 ENCOUNTER — Ambulatory Visit (INDEPENDENT_AMBULATORY_CARE_PROVIDER_SITE_OTHER): Payer: Medicaid Other

## 2021-08-15 ENCOUNTER — Other Ambulatory Visit: Payer: Self-pay

## 2021-08-15 VITALS — BP 115/65 | HR 89 | Wt 168.0 lb

## 2021-08-15 DIAGNOSIS — Z3A13 13 weeks gestation of pregnancy: Secondary | ICD-10-CM

## 2021-08-15 DIAGNOSIS — Z3480 Encounter for supervision of other normal pregnancy, unspecified trimester: Secondary | ICD-10-CM

## 2021-08-15 DIAGNOSIS — R300 Dysuria: Secondary | ICD-10-CM

## 2021-08-15 DIAGNOSIS — O219 Vomiting of pregnancy, unspecified: Secondary | ICD-10-CM

## 2021-08-15 MED ORDER — METOCLOPRAMIDE HCL 10 MG PO TABS
10.0000 mg | ORAL_TABLET | Freq: Three times a day (TID) | ORAL | 0 refills | Status: DC | PRN
Start: 2021-08-15 — End: 2021-10-23

## 2021-08-15 NOTE — Progress Notes (Signed)
? ?  LOW-RISK PREGNANCY OFFICE VISIT ? ?Patient name: Lindsay Phillips MRN [id number]  Date of birth: 02/25/1996 ?Chief Complaint:   ?Routine Prenatal Visit ? ?Subjective:   ?Lindsay Phillips is a 26 y.o. [phone] female at [redacted]w[redacted]d with an Estimated Date of Delivery: 02/20/22 being seen today for ongoing management of a low-risk pregnancy aeb has High priority for COVID-19 virus vaccination and Supervision of other normal pregnancy, antepartum on their problem list. ? ?Patient presents today with nausea.  She states she is taking Diclegis, but has intermittent periods of nausea.  She reports Zofran causes fatigue.  She questions if she can increase Diclegis to 4x/day. Patient denies abdominal cramping.  Patient denies vaginal concerns including abnormal discharge, itching, odor, or irritation, and bleeding. However, she does report irritation after sex that she describes as "pain in that area," that she reports resolves on it's own. She clarifies that the pain is usually noted with urination on every occasion. No usage of lubrication or condoms. She denies issues with diarrhea and reports some issues with constipation.  She reports she has been using a Physiological scientist with some relief. She reports decreased water intake d/t nausea.   Contractions: Not present. Vag. Bleeding: None.   . ? ?Reviewed past medical,surgical, social, obstetrical and family history as well as problem list, medications and allergies. ? ?Objective  ? ?Vitals:  ? 08/15/21 1024  ?BP: 115/65  ?Pulse: 89  ?Weight: 168 lb (76.2 kg)  ?Body mass index is 27.12 kg/m?.  ?Total Weight Gain:-6 lb (-2.722 kg) ? ?  ?     Physical Examination:  ? General appearance: Well appearing, and in no distress ? Mental status: Alert, oriented to person, place, and time ? Skin: Warm & dry ? Cardiovascular: Normal heart rate noted ? Respiratory: Normal respiratory effort, no distress ? Abdomen: Soft,  ? Pelvic: Cervical exam deferred          ? Extremities: Edema: None ? ?Fetal  Status: Fetal Heart Rate (bpm): 157     ? ?No results found for this or any previous visit (from the past 24 hour(s)).  ?Assessment & Plan:  ?Low-risk pregnancy of a 26 y.o., [id number] at [redacted]w[redacted]d with an Estimated Date of Delivery: 02/20/22  ? ?1. Supervision of other normal pregnancy, antepartum ?-Anticipatory guidance for upcoming appts. ?-Patient to schedule next appt in 4-6 weeks for an in-person visit. ?-Genetic screening to be completed today. ? ?2. [redacted] weeks gestation of pregnancy ?-Addressed concerns. ? ?3. Nausea and vomiting in pregnancy ?-informed okay to increase Diclegis to 4 pills/day if necessary. ?-Encouraged to start on weekend to assess for potential drowsiness. ?-Will also send in script for Reglan. ?-Instructed to contact office if no relief or worsening of symptoms.  ? ?4. Dysuria after sex. ?-Discussed potential causes including irritation to urethral meatus and bacteria entering urethra. ?-Instructed to utilize lubrication and assess for relief of symptoms. ?-If no relief, patient to contact office ? ?  ?Meds: No orders of the defined types were placed in this encounter. ? ?Labs/procedures today:  ?Lab Orders  ?No laboratory test(s) ordered today  ?  ? ?Reviewed: Preterm labor symptoms and general obstetric precautions including but not limited to vaginal bleeding, contractions, leaking of fluid and fetal movement were reviewed in detail with the patient.  All questions were answered. ? ?Follow-up: Return in about 4 weeks (around 09/12/2021) for [place]. ? ?No orders of the defined types were placed in this encounter. ? ?Maryann Conners MSN, CNM ?08/15/2021 ? ?

## 2021-08-16 ENCOUNTER — Other Ambulatory Visit: Payer: Self-pay

## 2021-08-16 ENCOUNTER — Encounter: Payer: Self-pay | Admitting: Advanced Practice Midwife

## 2021-08-16 DIAGNOSIS — Z3A13 13 weeks gestation of pregnancy: Secondary | ICD-10-CM

## 2021-08-16 MED ORDER — PRENATAL VITAMINS 28-0.8 MG PO TABS: ORAL_TABLET | ORAL | 11 refills | Status: DC

## 2021-08-21 ENCOUNTER — Telehealth: Payer: Self-pay

## 2021-08-21 NOTE — Telephone Encounter (Signed)
Called pt to inform her that her Cindy Hazy is low risk. Pt states she will look at the gender results with her husband. Understanding was voiced. ?Phelicia Dantes l Ralston Venus, CMA  ?

## 2021-08-21 NOTE — Telephone Encounter (Signed)
Spoke to [place] from [place]. [place] states that patient had H4 labs drawn in 2021 and she will not need it repeated unless we need different labs. H4 Test will be canceled.  ?Tula Schryver l Chriss Redel, CMA  ?

## 2021-09-11 ENCOUNTER — Other Ambulatory Visit: Payer: Self-pay

## 2021-09-11 ENCOUNTER — Encounter (HOSPITAL_COMMUNITY): Payer: Self-pay | Admitting: Obstetrics and Gynecology

## 2021-09-11 ENCOUNTER — Inpatient Hospital Stay (HOSPITAL_COMMUNITY)
Admission: AD | Admit: 2021-09-11 | Discharge: 2021-09-11 | Disposition: A | Discharge status: Home / Self Care | Payer: Medicaid Other | Attending: Obstetrics and Gynecology | Admitting: Obstetrics and Gynecology

## 2021-09-11 DIAGNOSIS — O26892 Other specified pregnancy related conditions, second trimester: Secondary | ICD-10-CM | POA: Diagnosis present

## 2021-09-11 DIAGNOSIS — R141 Gas pain: Secondary | ICD-10-CM

## 2021-09-11 DIAGNOSIS — Z3A16 16 weeks gestation of pregnancy: Secondary | ICD-10-CM | POA: Insufficient documentation

## 2021-09-11 LAB — URINALYSIS, ROUTINE W REFLEX MICROSCOPIC
Bilirubin Urine: NEGATIVE
Glucose, UA: NEGATIVE mg/dL
Hgb urine dipstick: NEGATIVE
Ketones, ur: NEGATIVE mg/dL
Leukocytes,Ua: NEGATIVE
Nitrite: NEGATIVE
Protein, ur: NEGATIVE mg/dL
Specific Gravity, Urine: 1.025 (ref 1.005–1.030)
pH: 6 (ref 5.0–8.0)

## 2021-09-11 MED ORDER — SIMETHICONE 80 MG PO CHEW
80.0000 mg | CHEWABLE_TABLET | Freq: Once | ORAL | Status: AC
  Administered 2021-09-11: 80 mg via ORAL
  Filled 2021-09-11: qty 1

## 2021-09-11 MED ORDER — SIMETHICONE 80 MG PO CHEW: 80.0000 mg | CHEWABLE_TABLET | Freq: Four times a day (QID) | ORAL | 0 refills | Status: DC | PRN

## 2021-09-11 NOTE — MAU Provider Note (Signed)
?History  ?  ? ?CSN: [id number] ? ?Arrival date and time: 09/11/21 0346 ? ? Event Date/Time  ? First Provider Initiated Contact with Patient 09/11/21 0451   ?  ? ?Chief Complaint  ?Patient presents with  ? Abdominal Cramping  ? ?HPI ?Lindsay Phillips is a 26 y.o. [id number] at [redacted]w[redacted]d who presents with left sided abdominal pain. She reports since 0100 she has had left sided abdominal pain from her lower abdomen up to her breast. She reports the pain moves up and down when she touches it. She rates the pain a 5/10 and has not tried anything for the pain. She denies any bleeding or leaking. She denies any constipation or diarrhea, denies any urinary complaints. She reports she did eat fried foods for the first time in a while last night.  ? ?OB History   ? ? Gravida  ?4  ? Para  ?2  ? Term  ?2  ? Preterm  ?   ? AB  ?1  ? Living  ?2  ?  ? ? SAB  ?   ? IAB  ?1  ? Ectopic  ?   ? Multiple  ?0  ? Live Births  ?2  ?   ?  ?  ? ? ?Past Medical History:  ?Diagnosis Date  ? Medical history non-contributory   ? ? ?Past Surgical History:  ?Procedure Laterality Date  ? INDUCED ABORTION    ? ? ?Family History  ?Problem Relation Age of Onset  ? Healthy Mother   ? Healthy Father   ? Hypertension Maternal Grandmother   ? Lupus Paternal Grandmother   ? ? ?Social History  ? ?Tobacco Use  ? Smoking status: Never  ? Smokeless tobacco: Never  ?Vaping Use  ? Vaping Use: Never used  ?Substance Use Topics  ? Alcohol use: Not Currently  ?  Comment: not in pregnancy  ? Drug use: Never  ? ? ?Allergies: No Known Allergies ? ?Medications Prior to Admission  ?Medication Sig Dispense Refill Last Dose  ? Prenatal Vit-Fe Fumarate-FA (PRENATAL VITAMINS) 28-0.8 MG TABS Take 1 tablet by mouth daily. 30 tablet 11 09/10/2021  ? cephALEXin (KEFLEX) 500 MG capsule Take 1 capsule (500 mg total) by mouth 4 (four) times daily. (Patient not taking: Reported on 08/15/2021) 28 capsule 0   ? Doxylamine-Pyridoxine (DICLEGIS) 10-10 MG TBEC Take 2 tablets by mouth at bedtime as  needed (nausea). 60 tablet 5   ? fluticasone (FLONASE) 50 MCG/ACT nasal spray Place 1 spray into both nostrils 2 (two) times daily. (Patient not taking: Reported on 07/26/2021)     ? meclizine (ANTIVERT) 25 MG tablet Take 25 mg by mouth 3 (three) times daily as needed. (Patient not taking: Reported on 07/26/2021)     ? metoCLOPramide (REGLAN) 10 MG tablet Take 1 tablet (10 mg total) by mouth every 8 (eight) hours as needed for nausea. 30 tablet 0   ? Omega-3 Fatty Acids (FISH OIL) 500 MG CAPS Take by mouth. (Patient not taking: Reported on 07/26/2021)     ? ondansetron (ZOFRAN-ODT) 4 MG disintegrating tablet Take 1 tablet (4 mg total) by mouth every 8 (eight) hours as needed. 30 tablet 6   ? Prenatal Vit-Fe Fumarate-FA (PRENATAL VITAMINS PO) Take by mouth.     ? ? ?Review of Systems  ?Constitutional: Negative.  Negative for fatigue and fever.  ?HENT: Negative.    ?Respiratory: Negative.  Negative for shortness of breath.   ?Cardiovascular: Negative.  Negative for chest pain.  ?  Gastrointestinal:  Positive for abdominal pain. Negative for constipation, diarrhea, nausea and vomiting.  ?Genitourinary: Negative.  Negative for dysuria, vaginal bleeding and vaginal discharge.  ?Neurological: Negative.  Negative for dizziness and headaches.  ?Physical Exam  ? ?Blood pressure 112/70, pulse 76, temperature 98 ?F (36.7 ?C), temperature source Oral, resp. rate 17, height 5\' 6"  (1.676 m), weight 79.7 kg, last menstrual period 05/05/2021, SpO2 100 %, currently breastfeeding. ? ?Physical Exam ?Vitals and nursing note reviewed.  ?Constitutional:   ?   General: She is not in acute distress. ?   Appearance: She is well-developed.  ?HENT:  ?   Head: Normocephalic.  ?Eyes:  ?   Pupils: Pupils are equal, round, and reactive to light.  ?Cardiovascular:  ?   Rate and Rhythm: Normal rate and regular rhythm.  ?   Heart sounds: Normal heart sounds.  ?Pulmonary:  ?   Effort: Pulmonary effort is normal. No respiratory distress.  ?   Breath sounds:  Normal breath sounds.  ?Abdominal:  ?   General: Bowel sounds are normal. There is no distension.  ?   Palpations: Abdomen is soft.  ?   Tenderness: There is no abdominal tenderness.  ? ? ?Skin: ?   General: Skin is warm and dry.  ?Neurological:  ?   Mental Status: She is alert and oriented to person, place, and time.  ?Psychiatric:     ?   Mood and Affect: Mood normal.     ?   Behavior: Behavior normal.     ?   Thought Content: Thought content normal.     ?   Judgment: Judgment normal.  ? ?FHT: 154 bpm ? ?MAU Course  ?Procedures ?Results for orders placed or performed during the hospital encounter of 09/11/21 (from the past 24 hour(s))  ?Urinalysis, Routine w reflex microscopic Urine, Clean Catch     Status: Abnormal  ? Collection Time: 09/11/21  5:04 AM  ?Result Value Ref Range  ? Color, Urine YELLOW YELLOW  ? APPearance HAZY (A) CLEAR  ? Specific Gravity, Urine 1.025 1.005 - 1.030  ? pH 6.0 5.0 - 8.0  ? Glucose, UA NEGATIVE NEGATIVE mg/dL  ? Hgb urine dipstick NEGATIVE NEGATIVE  ? Bilirubin Urine NEGATIVE NEGATIVE  ? Ketones, ur NEGATIVE NEGATIVE mg/dL  ? Protein, ur NEGATIVE NEGATIVE mg/dL  ? Nitrite NEGATIVE NEGATIVE  ? Leukocytes,Ua NEGATIVE NEGATIVE  ?  ? ?MDM ?UA ?Simethicone ? ?Patient reports feeling improvement in symptoms after medication. Reports feeling reassured ? ?Discussed importance of hydration in pregnancy ? ?Assessment and Plan  ? ?1. Gas pain   ?2. [redacted] weeks gestation of pregnancy   ? ?-Discharge home in stable condition ?-Rx for simethicone sent to patient's pharmacy ?-Second trimester precautions discussed ?-Patient advised to follow-up with OB as scheduled tomorrow for prenatal care ?-Patient may return to MAU as needed or if her condition were to change or worsen ? ? ?09/13/21 CNM ?09/11/2021, 4:51 AM  ?

## 2021-09-11 NOTE — MAU Note (Signed)
.  Lindsay Phillips is a 26 y.o. at [redacted]w[redacted]d here in MAU reporting: right sided cramping that started at 0100-it radiates up to right breast. Denies SROM, vaginal bleeding or discharge.  ? ?Onset of complaint: 0100 ?Pain score: 5 ?Vitals:  ? 09/11/21 0428  ?BP: 112/70  ?Pulse: 76  ?Resp: 17  ?Temp: 98 ?F (36.7 ?C)  ?SpO2: 100%  ?   ?FHT: ?154bpm ? ?

## 2021-09-11 NOTE — Discharge Instructions (Signed)

## 2021-09-12 ENCOUNTER — Encounter: Payer: Self-pay | Admitting: Advanced Practice Midwife

## 2021-09-12 ENCOUNTER — Ambulatory Visit (INDEPENDENT_AMBULATORY_CARE_PROVIDER_SITE_OTHER): Payer: Medicaid Other | Admitting: Advanced Practice Midwife

## 2021-09-12 VITALS — BP 112/74 | HR 91 | Wt 171.0 lb

## 2021-09-12 DIAGNOSIS — Z3480 Encounter for supervision of other normal pregnancy, unspecified trimester: Secondary | ICD-10-CM

## 2021-09-12 DIAGNOSIS — Z3A17 17 weeks gestation of pregnancy: Secondary | ICD-10-CM

## 2021-09-12 NOTE — Progress Notes (Signed)
Patient was having cramping yesterday and went to MAU. Patient states that rest did help her feel better.  ? ?Patient also complaining that she feels lightheaded or dizziness but she has check her bp and it is normal when this occurs. Armandina Stammer RN  ?

## 2021-09-12 NOTE — Progress Notes (Signed)
? ?  PRENATAL VISIT NOTE ? ?Subjective:  ?Lindsay Phillips is a 26 y.o. [phone] at [redacted]w[redacted]d being seen today for ongoing prenatal care.  She is currently monitored for the following issues for this low-risk pregnancy and has High priority for COVID-19 virus vaccination and Supervision of other normal pregnancy, antepartum on their problem list. ? ?Patient reports  episodes of light-headedness.   Also has some intermittent LUQ pains. .  Contractions: Not present. Vag. Bleeding: None.  Movement: Absent. Denies leaking of fluid.  ? ?The following portions of the patient's history were reviewed and updated as appropriate: allergies, current medications, past family history, past medical history, past social history, past surgical history and problem list.  ? ?Objective:  ? ?Vitals:  ? 09/12/21 1021  ?BP: 112/74  ?Pulse: 91  ?Weight: 171 lb (77.6 kg)  ? ? ?Fetal Status: Fetal Heart Rate (bpm): 145   Movement: Absent    ? ?General:  Alert, oriented and cooperative. Patient is in no acute distress.  ?Skin: Skin is warm and dry. No rash noted.   ?Cardiovascular: Normal heart rate noted  ?Respiratory: Normal respiratory effort, no problems with respiration noted  ?Abdomen: Soft, gravid, appropriate for gestational age.  Pain/Pressure: Present     ?Pelvic: Cervical exam deferred        ?Extremities: Normal range of motion.  Edema: None  ?Mental Status: Normal mood and affect. Normal behavior. Normal judgment and thought content.  ? ?Assessment and Plan:  ?Pregnancy: [id number] at [redacted]w[redacted]d ?1. Supervision of other normal pregnancy, antepartum ?   Discussed periods of lightheadedness are normal ?   Advised better hydration ?   Discussed L sided pain is likely bowel related.  Recommend increased fiber intake and Mylicon prn ? ?Preterm labor symptoms and general obstetric precautions including but not limited to vaginal bleeding, contractions, leaking of fluid and fetal movement were reviewed in detail with the patient. ?Please refer to After  Visit Summary for other counseling recommendations.  ? ?Return in about 4 weeks (around 10/10/2021) for [employer]. ? ?Future Appointments  ?Date Time Provider [place]  ?09/26/2021 11:15 AM [facility] NURSE [facility]  ?09/26/2021 11:30 AM [facility] US2 [facility] [place]  ?10/10/2021  9:55 AM Seabron Spates, CNM [facility] None  ?11/10/2021  8:15 AM Nehemiah Settle Tanna Savoy, DO [facility] None  ? ? ?Hansel Feinstein, CNM ? ?

## 2021-09-15 DIAGNOSIS — D563 Thalassemia minor: Secondary | ICD-10-CM | POA: Insufficient documentation

## 2021-09-18 ENCOUNTER — Other Ambulatory Visit: Payer: Self-pay

## 2021-09-18 DIAGNOSIS — D563 Thalassemia minor: Secondary | ICD-10-CM

## 2021-09-26 ENCOUNTER — Ambulatory Visit: Payer: Medicaid Other | Attending: Obstetrics | Admitting: Obstetrics

## 2021-09-26 ENCOUNTER — Other Ambulatory Visit: Payer: Self-pay | Admitting: *Deleted

## 2021-09-26 ENCOUNTER — Encounter: Payer: Self-pay | Admitting: Advanced Practice Midwife

## 2021-09-26 ENCOUNTER — Ambulatory Visit: Payer: Medicaid Other | Admitting: *Deleted

## 2021-09-26 ENCOUNTER — Ambulatory Visit: Payer: Medicaid Other | Attending: Advanced Practice Midwife

## 2021-09-26 VITALS — BP 111/68 | HR 62

## 2021-09-26 DIAGNOSIS — Z3A19 19 weeks gestation of pregnancy: Secondary | ICD-10-CM

## 2021-09-26 DIAGNOSIS — O358XX0 Maternal care for other (suspected) fetal abnormality and damage, not applicable or unspecified: Secondary | ICD-10-CM | POA: Diagnosis not present

## 2021-09-26 DIAGNOSIS — O09892 Supervision of other high risk pregnancies, second trimester: Secondary | ICD-10-CM | POA: Insufficient documentation

## 2021-09-26 DIAGNOSIS — Z148 Genetic carrier of other disease: Secondary | ICD-10-CM | POA: Insufficient documentation

## 2021-09-26 DIAGNOSIS — O321XX0 Maternal care for breech presentation, not applicable or unspecified: Secondary | ICD-10-CM | POA: Insufficient documentation

## 2021-09-26 DIAGNOSIS — Z3480 Encounter for supervision of other normal pregnancy, unspecified trimester: Secondary | ICD-10-CM | POA: Diagnosis not present

## 2021-09-26 DIAGNOSIS — Z363 Encounter for antenatal screening for malformations: Secondary | ICD-10-CM | POA: Insufficient documentation

## 2021-09-26 DIAGNOSIS — Z3A08 8 weeks gestation of pregnancy: Secondary | ICD-10-CM | POA: Insufficient documentation

## 2021-09-26 DIAGNOSIS — O09899 Supervision of other high risk pregnancies, unspecified trimester: Secondary | ICD-10-CM

## 2021-09-26 DIAGNOSIS — D563 Thalassemia minor: Secondary | ICD-10-CM

## 2021-09-26 DIAGNOSIS — O283 Abnormal ultrasonic finding on antenatal screening of mother: Secondary | ICD-10-CM | POA: Insufficient documentation

## 2021-09-26 NOTE — Progress Notes (Signed)
MFM Note ? ?Lindsay Phillips was seen for a detailed fetal anatomy scan. ? ?She denies any a significant past medical history and denies any problems in her current pregnancy.   ? ?She had a cell free DNA test earlier in her pregnancy which indicated a low risk for trisomy 1, 31, and 13. A female fetus is predicted. ? ?She was informed that the fetal growth and amniotic fluid level were appropriate for her gestational age.  ? ?A possible left renal cyst was noted in the fetus.  The small cyst is located in the inferior portion of the left kidney.   ? ?The patient was reassured that her baby will most likely not be affected by this cyst after birth.  We will continue to follow her with monthly ultrasound exams to assess this finding. ? ?The patient was informed that anomalies may be missed due to technical limitations. If the fetus is in a suboptimal position or maternal habitus is increased, visualization of the fetus in the maternal uterus may be impaired. ? ?A follow-up exam was scheduled in 4 weeks to assess the fetal growth and to reassess the kidneys.   ? ?The patient stated that all of her questions were answered today. ? ?A total of 30 minutes was spent counseling and coordinating the care for this patient.  Greater than 50% of the time was spent in direct face-to-face contact. ?

## 2021-10-10 ENCOUNTER — Encounter: Payer: Self-pay | Admitting: Advanced Practice Midwife

## 2021-10-10 ENCOUNTER — Ambulatory Visit (INDEPENDENT_AMBULATORY_CARE_PROVIDER_SITE_OTHER): Payer: Medicaid Other | Admitting: Advanced Practice Midwife

## 2021-10-10 VITALS — BP 113/63 | HR 80

## 2021-10-10 DIAGNOSIS — Z3A21 21 weeks gestation of pregnancy: Secondary | ICD-10-CM

## 2021-10-10 DIAGNOSIS — Z3480 Encounter for supervision of other normal pregnancy, unspecified trimester: Secondary | ICD-10-CM

## 2021-10-10 DIAGNOSIS — O283 Abnormal ultrasonic finding on antenatal screening of mother: Secondary | ICD-10-CM

## 2021-10-10 NOTE — Progress Notes (Signed)
? ?  PRENATAL VISIT NOTE ? ?Subjective:  ?Lindsay Phillips is a 26 y.o. [phone] at [redacted]w[redacted]d being seen today for ongoing prenatal care.  She is currently monitored for the following issues for this low-risk pregnancy and has High priority for COVID-19 virus vaccination; Supervision of other normal pregnancy, antepartum; Alpha thalassemia silent carrier; and Abnormal fetal ultrasound on their problem list. ? ?Patient reports no complaints.  Contractions: Not present. Vag. Bleeding: None.  Movement: Present. Denies leaking of fluid.  ? ?The following portions of the patient's history were reviewed and updated as appropriate: allergies, current medications, past family history, past medical history, past social history, past surgical history and problem list.  ? ?Objective:  ? ?Vitals:  ? 10/10/21 0952  ?BP: 113/63  ?Pulse: 80  ? ? ?Fetal Status: Fetal Heart Rate (bpm): 145   Movement: Present    ? ?General:  Alert, oriented and cooperative. Patient is in no acute distress.  ?Skin: Skin is warm and dry. No rash noted.   ?Cardiovascular: Normal heart rate noted  ?Respiratory: Normal respiratory effort, no problems with respiration noted  ?Abdomen: Soft, gravid, appropriate for gestational age.  Pain/Pressure: Absent     ?Pelvic: Cervical exam deferred        ?Extremities: Normal range of motion.  Edema: None  ?Mental Status: Normal mood and affect. Normal behavior. Normal judgment and thought content.  ? ?Assessment and Plan:  ?Pregnancy: [id number] at [redacted]w[redacted]d ?1. [redacted] weeks gestation of pregnancy ?    Reviewed second trimester, including round lig pains ? ?2. Supervision of other normal pregnancy, antepartum ? ? ?3. Abnormal fetal ultrasound ?    Cyst on fetal kidney, has followup scheduled ? ?Preterm labor symptoms and general obstetric precautions including but not limited to vaginal bleeding, contractions, leaking of fluid and fetal movement were reviewed in detail with the patient. ?Please refer to After Visit Summary for other  counseling recommendations.  ? ?Return in about 4 weeks (around 11/07/2021) for [facility] ? ?Future Appointments  ?Date Time Provider Department Center  ?10/31/2021  3:30 PM [facility] NURSE [facility]  ?10/31/2021  3:45 PM [facility] US1 [facility]  ?11/10/2021  8:15 AM Adrian Blackwater Rhona Raider, DO [facility] None  ? ? ?Wynelle Bourgeois, CNM ?

## 2021-10-11 ENCOUNTER — Encounter: Payer: Self-pay | Admitting: Advanced Practice Midwife

## 2021-10-23 ENCOUNTER — Other Ambulatory Visit: Payer: Self-pay

## 2021-10-23 ENCOUNTER — Encounter (HOSPITAL_COMMUNITY): Payer: Self-pay | Admitting: Obstetrics and Gynecology

## 2021-10-23 ENCOUNTER — Inpatient Hospital Stay (HOSPITAL_COMMUNITY)
Admission: AD | Admit: 2021-10-23 | Discharge: 2021-10-23 | Disposition: A | Discharge status: Home / Self Care | Payer: Medicaid Other | Attending: Obstetrics and Gynecology | Admitting: Obstetrics and Gynecology

## 2021-10-23 DIAGNOSIS — O212 Late vomiting of pregnancy: Secondary | ICD-10-CM | POA: Diagnosis not present

## 2021-10-23 DIAGNOSIS — O219 Vomiting of pregnancy, unspecified: Secondary | ICD-10-CM

## 2021-10-23 DIAGNOSIS — Z3A22 22 weeks gestation of pregnancy: Secondary | ICD-10-CM | POA: Insufficient documentation

## 2021-10-23 DIAGNOSIS — O26892 Other specified pregnancy related conditions, second trimester: Secondary | ICD-10-CM | POA: Diagnosis present

## 2021-10-23 LAB — URINALYSIS, ROUTINE W REFLEX MICROSCOPIC
Bilirubin Urine: NEGATIVE
Glucose, UA: 50 mg/dL — AB
Hgb urine dipstick: NEGATIVE
Ketones, ur: 20 mg/dL — AB
Nitrite: NEGATIVE
Protein, ur: 30 mg/dL — AB
Specific Gravity, Urine: 1.025 (ref 1.005–1.030)
pH: 5 (ref 5.0–8.0)

## 2021-10-23 LAB — CBC WITH DIFFERENTIAL/PLATELET
Abs Immature Granulocytes: 0.06 10*3/uL (ref 0.00–0.07)
Basophils Absolute: 0 10*3/uL (ref 0.0–0.1)
Basophils Relative: 0 %
Eosinophils Absolute: 0 10*3/uL (ref 0.0–0.5)
Eosinophils Relative: 0 %
HCT: 37.5 % (ref 36.0–46.0)
Hemoglobin: 12.6 g/dL (ref 12.0–15.0)
Immature Granulocytes: 1 %
Lymphocytes Relative: 5 %
Lymphs Abs: 0.5 10*3/uL — ABNORMAL LOW (ref 0.7–4.0)
MCH: 29.9 pg (ref 26.0–34.0)
MCHC: 33.6 g/dL (ref 30.0–36.0)
MCV: 88.9 fL (ref 80.0–100.0)
Monocytes Absolute: 0.4 10*3/uL (ref 0.1–1.0)
Monocytes Relative: 4 %
Neutro Abs: 10.7 10*3/uL — ABNORMAL HIGH (ref 1.7–7.7)
Neutrophils Relative %: 90 %
Platelets: 172 10*3/uL (ref 150–400)
RBC: 4.22 MIL/uL (ref 3.87–5.11)
RDW: 12.8 % (ref 11.5–15.5)
WBC: 11.7 10*3/uL — ABNORMAL HIGH (ref 4.0–10.5)
nRBC: 0 % (ref 0.0–0.2)

## 2021-10-23 LAB — BASIC METABOLIC PANEL
Anion gap: 8 (ref 5–15)
BUN: 7 mg/dL (ref 6–20)
CO2: 22 mmol/L (ref 22–32)
Calcium: 8.3 mg/dL — ABNORMAL LOW (ref 8.9–10.3)
Chloride: 106 mmol/L (ref 98–111)
Creatinine, Ser: 0.68 mg/dL (ref 0.44–1.00)
GFR, Estimated: >60 mL/min (ref >60)
Glucose, Bld: 92 mg/dL (ref 70–99)
Potassium: 3.5 mmol/L (ref 3.5–5.1)
Sodium: 136 mmol/L (ref 135–145)

## 2021-10-23 MED ORDER — FAMOTIDINE IN NACL 20-0.9 MG/50ML-% IV SOLN
20.0000 mg | Freq: Once | INTRAVENOUS | Status: AC
Start: 2021-10-23 — End: 2021-10-23
  Administered 2021-10-23: 20 mg via INTRAVENOUS
  Filled 2021-10-23: qty 50

## 2021-10-23 MED ORDER — LACTATED RINGERS IV BOLUS
1000.0000 mL | Freq: Once | INTRAVENOUS | Status: AC
  Administered 2021-10-23: 1000 mL via INTRAVENOUS

## 2021-10-23 MED ORDER — SODIUM CHLORIDE 0.9 % IV SOLN: 25.0000 mg | Freq: Once | INTRAVENOUS | Status: DC

## 2021-10-23 MED ORDER — SODIUM CHLORIDE 0.9 % IV SOLN
8.0000 mg | Freq: Once | INTRAVENOUS | Status: AC
  Administered 2021-10-23: 8 mg via INTRAVENOUS
  Filled 2021-10-23: qty 4

## 2021-10-23 NOTE — MAU Provider Note (Signed)
History     CSN: [id number]  Arrival date and time: 10/23/21 2022   Event Date/Time   First Provider Initiated Contact with Patient 10/23/21 2104      Chief Complaint  Patient presents with   Emesis   Lindsay Phillips is a 26 y.o. [id number] at 7256w6d who receives care at [facility].  She presents today for Emesis.  She reports having about 13 episodes of vomiting today. She attempted to take her Diclegis, but was unable to keep it down. She states she has been attempting to consume water and pedialyte, but having vomiting 5-10 minutes after. She also reports trying to eat around 11am, but threw up.  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 She does report her kids school reported a case of HMF disease, but her boys lack symptoms. She reports that her last full meal was yesterday night and consisted of BBQ chicken, roasted potatoes, and corn. She denies diarrhea, but reports some abdominal cramping and headache that she contributes to dehydration. She endorses fetal movement and denies vaginal concerns.    OB History     Gravida  4   Para  2   Term  2   Preterm      AB  1   Living  2      SAB      IAB  1   Ectopic      Multiple  0   Live Births  2           Past Medical History:  Diagnosis Date   Medical history non-contributory     Past Surgical History:  Procedure Laterality Date   INDUCED ABORTION      Family History  Problem Relation Age of Onset   Healthy Mother    Healthy Father    Hypertension Maternal Grandmother    Lupus Paternal Grandmother     Social History   Tobacco Use   Smoking status: Never   Smokeless tobacco: Never  Vaping Use   Vaping Use: Never used  Substance Use Topics   Alcohol use: Not Currently    Comment: not in pregnancy   Drug use: Never    Allergies: No Known Allergies  Medications Prior to Admission  Medication Sig Dispense Refill Last Dose   Doxylamine-Pyridoxine  (DICLEGIS) 10-10 MG TBEC Take 2 tablets by mouth at bedtime as needed (nausea). 60 tablet 5 10/22/2021 at 1800   Prenatal Vit-Fe Fumarate-FA (PRENATAL VITAMINS) 28-0.8 MG TABS Take 1 tablet by mouth daily. 30 tablet 11 10/22/2021   fluticasone (FLONASE) 50 MCG/ACT nasal spray Place 1 spray into both nostrils 2 (two) times daily. (Patient not taking: Reported on 07/26/2021)      meclizine (ANTIVERT) 25 MG tablet Take 25 mg by mouth 3 (three) times daily as needed. (Patient not taking: Reported on 07/26/2021)      metoCLOPramide (REGLAN) 10 MG tablet Take 1 tablet (10 mg total) by mouth every 8 (eight) hours as needed for nausea. 30 tablet 0    Omega-3 Fatty Acids (FISH OIL) 500 MG CAPS Take by mouth. (Patient not taking: Reported on 07/26/2021)      ondansetron (ZOFRAN-ODT) 4 MG disintegrating tablet Take 1 tablet (4 mg total) by mouth every 8 (eight) hours as needed. 30 tablet 6    Prenatal Vit-Fe Fumarate-FA (PRENATAL VITAMINS PO) Take by mouth.       Review of Systems  Constitutional:  Negative for chills and fever.  Gastrointestinal:  Positive for abdominal pain (Mild cramping), nausea and vomiting. Negative for constipation and diarrhea.  Genitourinary:  Negative for difficulty urinating, dysuria, vaginal bleeding and vaginal discharge.  Neurological:  Positive for dizziness (With ambulation) and light-headedness. Negative for headaches.  Physical Exam   Blood pressure 116/71, pulse (!) 107, temperature 99.2 F (37.3 C), temperature source Oral, resp. rate 20, height 5\' 6"  (1.676 m), weight 79.6 kg, last menstrual period 05/05/2021, SpO2 100 %, currently breastfeeding.  Physical Exam Vitals reviewed.  Constitutional:      Appearance: Normal appearance. She is not toxic-appearing.  HENT:     Head: Normocephalic and atraumatic.  Eyes:     Conjunctiva/sclera: Conjunctivae normal.  Cardiovascular:     Rate and Rhythm: Normal rate.  Pulmonary:     Effort: Pulmonary effort is normal. No  respiratory distress.  Musculoskeletal:        General: Normal range of motion.     Cervical back: Normal range of motion.  Skin:    General: Skin is warm and dry.  Neurological:     Mental Status: She is alert and oriented to person, place, and time.  Psychiatric:        Mood and Affect: Mood normal.        Behavior: Behavior normal.   Doppler: 160  MAU Course  Procedures Results for orders placed or performed during the hospital encounter of 10/23/21 (from the past 24 hour(s))  Urinalysis, Routine w reflex microscopic     Status: Abnormal   Collection Time: 10/23/21  8:29 PM  Result Value Ref Range   Color, Urine AMBER (A) YELLOW   APPearance CLOUDY (A) CLEAR   Specific Gravity, Urine 1.025 1.005 - 1.030   pH 5.0 5.0 - 8.0   Glucose, UA 50 (A) NEGATIVE mg/dL   Hgb urine dipstick NEGATIVE NEGATIVE   Bilirubin Urine NEGATIVE NEGATIVE   Ketones, ur 20 (A) NEGATIVE mg/dL   Protein, ur 30 (A) NEGATIVE mg/dL   Nitrite NEGATIVE NEGATIVE   Leukocytes,Ua SMALL (A) NEGATIVE   RBC / HPF 0-5 0 - 5 RBC/hpf   WBC, UA 6-10 0 - 5 WBC/hpf   Bacteria, UA FEW (A) NONE SEEN   Squamous Epithelial / LPF 11-20 0 - 5   Mucus PRESENT   Basic metabolic panel     Status: Abnormal   Collection Time: 10/23/21  9:21 PM  Result Value Ref Range   Sodium 136 135 - 145 mmol/L   Potassium 3.5 3.5 - 5.1 mmol/L   Chloride 106 98 - 111 mmol/L   CO2 22 22 - 32 mmol/L   Glucose, Bld 92 70 - 99 mg/dL   BUN 7 6 - 20 mg/dL   Creatinine, Ser 10/25/21 0.44 - 1.00 mg/dL   Calcium 8.3 (L) 8.9 - 10.3 mg/dL   GFR, Estimated 1.60 >73 mL/min   Anion gap 8 5 - 15  CBC with Differential/Platelet     Status: Abnormal   Collection Time: 10/23/21  9:21 PM  Result Value Ref Range   WBC 11.7 (H) 4.0 - 10.5 K/uL   RBC 4.22 3.87 - 5.11 MIL/uL   Hemoglobin 12.6 12.0 - 15.0 g/dL   HCT 10/25/21 06.2 - 69.4 %   MCV 88.9 80.0 - 100.0 fL   MCH 29.9 26.0 - 34.0 pg   MCHC 33.6 30.0 - 36.0 g/dL   RDW 85.4 62.7 - 03.5 %   Platelets  172 150 - 400 K/uL   nRBC 0.0 0.0 - 0.2 %  Neutrophils Relative % 90 %   Neutro Abs 10.7 (H) 1.7 - 7.7 K/uL   Lymphocytes Relative 5 %   Lymphs Abs 0.5 (L) 0.7 - 4.0 K/uL   Monocytes Relative 4 %   Monocytes Absolute 0.4 0.1 - 1.0 K/uL   Eosinophils Relative 0 %   Eosinophils Absolute 0.0 0.0 - 0.5 K/uL   Basophils Relative 0 %   Basophils Absolute 0.0 0.0 - 0.1 K/uL   Immature Granulocytes 1 %   Abs Immature Granulocytes 0.06 0.00 - 0.07 K/uL    MDM Start IV LR Bolus Antiemetic PPI Assessment and Plan  26 year old, [id number]  SIUP at 22.6 weeks N/V  -Reviewed POC with patient. -Patient drove self so will not give phenergan.  -Zofran 8mg  ODT -Labs as ordered -Start IV with LR Bolus -Famotidine for heartburn -Monitor and reassess    10/23/2021, 9:04 PM   Reassessment (10:30 PM)  -Patient reports nausea has resolved. -Able to tolerate sips of ginger ale. -Instructed to go home and take Diclegis. -If symptoms remain in AM, call office and requests script for Zofran ODT. -Patient verbalizes understanding and without questions. -Encouraged to call primary office or return to MAU if symptoms worsen or with the onset of new symptoms. -Discharged to home in improved condition.  10/25/2021 MSN, CNM Advanced Practice Provider, [facility]

## 2021-10-23 NOTE — MAU Note (Signed)
.  Lindsay Phillips is a 26 y.o. at [redacted]w[redacted]d here in MAU reporting: newly onset nausea since yesterday, and vomiting since 1100 this am. Pt taking diclegis and its been very helpful managing the N/V but it came back and the medication is not working. Pt tried taking it today but she was not able to keep it down, and has not been able to eat or drink without vomiting. Pt has had 13 episodes of emesis. Pt reports ABD cramping and lightheadedness, but states she knows its cause she is dehydrated. Pt denies DFM, VB, LOF, abnormal discharge.   Onset of complaint: yesterdat Pain score: 3/10 ABD Vitals:   10/23/21 2032  BP: 116/71  Pulse: (!) 107  Resp: 20  Temp: 99.2 F (37.3 C)  SpO2: 100%     FHT:160 Lab orders placed from triage:  UA

## 2021-10-24 LAB — CULTURE, OB URINE: Culture: <10000 — AB

## 2021-10-31 ENCOUNTER — Ambulatory Visit: Payer: Medicaid Other | Attending: Obstetrics

## 2021-10-31 ENCOUNTER — Ambulatory Visit: Payer: Medicaid Other | Admitting: *Deleted

## 2021-10-31 VITALS — BP 111/57 | HR 81

## 2021-10-31 DIAGNOSIS — O285 Abnormal chromosomal and genetic finding on antenatal screening of mother: Secondary | ICD-10-CM

## 2021-10-31 DIAGNOSIS — Z3A24 24 weeks gestation of pregnancy: Secondary | ICD-10-CM | POA: Insufficient documentation

## 2021-10-31 DIAGNOSIS — O283 Abnormal ultrasonic finding on antenatal screening of mother: Secondary | ICD-10-CM

## 2021-10-31 DIAGNOSIS — Z148 Genetic carrier of other disease: Secondary | ICD-10-CM | POA: Insufficient documentation

## 2021-10-31 DIAGNOSIS — O09899 Supervision of other high risk pregnancies, unspecified trimester: Secondary | ICD-10-CM | POA: Insufficient documentation

## 2021-10-31 DIAGNOSIS — O358XX0 Maternal care for other (suspected) fetal abnormality and damage, not applicable or unspecified: Secondary | ICD-10-CM | POA: Diagnosis not present

## 2021-10-31 DIAGNOSIS — Z362 Encounter for other antenatal screening follow-up: Secondary | ICD-10-CM | POA: Insufficient documentation

## 2021-10-31 DIAGNOSIS — Z3480 Encounter for supervision of other normal pregnancy, unspecified trimester: Secondary | ICD-10-CM

## 2021-10-31 DIAGNOSIS — O09892 Supervision of other high risk pregnancies, second trimester: Secondary | ICD-10-CM | POA: Diagnosis not present

## 2021-10-31 DIAGNOSIS — D563 Thalassemia minor: Secondary | ICD-10-CM

## 2021-10-31 DIAGNOSIS — O359XX0 Maternal care for (suspected) fetal abnormality and damage, unspecified, not applicable or unspecified: Secondary | ICD-10-CM

## 2021-11-01 ENCOUNTER — Other Ambulatory Visit: Payer: Self-pay | Admitting: *Deleted

## 2021-11-01 DIAGNOSIS — O35EXX0 Maternal care for other (suspected) fetal abnormality and damage, fetal genitourinary anomalies, not applicable or unspecified: Secondary | ICD-10-CM

## 2021-11-01 DIAGNOSIS — Q6100 Congenital renal cyst, unspecified: Secondary | ICD-10-CM

## 2021-11-06 ENCOUNTER — Other Ambulatory Visit: Payer: Self-pay | Admitting: Obstetrics and Gynecology

## 2021-11-06 ENCOUNTER — Telehealth: Payer: Self-pay

## 2021-11-06 MED ORDER — ONDANSETRON 4 MG PO TBDP: 4.0000 mg | ORAL_TABLET | Freq: Four times a day (QID) | ORAL | 2 refills | Status: DC | PRN

## 2021-11-06 NOTE — Telephone Encounter (Signed)
Pt called requesting a Rx for Zofran. A message will be sent to the provider. Lindsay Phillips l Remmi Armenteros, CMA

## 2021-11-10 ENCOUNTER — Encounter: Payer: Medicaid Other | Admitting: Family Medicine

## 2021-11-17 ENCOUNTER — Ambulatory Visit (INDEPENDENT_AMBULATORY_CARE_PROVIDER_SITE_OTHER): Payer: Medicaid Other | Admitting: Family Medicine

## 2021-11-17 VITALS — BP 101/64 | HR 77 | Wt 179.0 lb

## 2021-11-17 DIAGNOSIS — Z6791 Unspecified blood type, Rh negative: Secondary | ICD-10-CM | POA: Diagnosis not present

## 2021-11-17 DIAGNOSIS — O26899 Other specified pregnancy related conditions, unspecified trimester: Secondary | ICD-10-CM | POA: Diagnosis not present

## 2021-11-17 DIAGNOSIS — D563 Thalassemia minor: Secondary | ICD-10-CM

## 2021-11-17 DIAGNOSIS — Z3A26 26 weeks gestation of pregnancy: Secondary | ICD-10-CM

## 2021-11-17 DIAGNOSIS — Z3480 Encounter for supervision of other normal pregnancy, unspecified trimester: Secondary | ICD-10-CM

## 2021-11-17 DIAGNOSIS — O283 Abnormal ultrasonic finding on antenatal screening of mother: Secondary | ICD-10-CM

## 2021-11-17 MED ORDER — RHO D IMMUNE GLOBULIN 1500 UNIT/2ML IJ SOSY
300.0000 ug | PREFILLED_SYRINGE | Freq: Once | INTRAMUSCULAR | Status: AC
  Administered 2021-11-17: 300 ug via INTRAMUSCULAR

## 2021-11-17 NOTE — Progress Notes (Signed)
   PRENATAL VISIT NOTE  Subjective:  Lindsay Phillips is a 26 y.o. [id number] at [redacted]w[redacted]d being seen today for ongoing prenatal care.  She is currently monitored for the following issues for this low-risk pregnancy and has High priority for COVID-19 virus vaccination; Rh negative state in antepartum period; Supervision of other normal pregnancy, antepartum; Alpha thalassemia silent carrier; and Abnormal fetal ultrasound on their problem list.  Patient reports no complaints.  Contractions: Not present. Vag. Bleeding: None.  Movement: Present. Denies leaking of fluid.   The following portions of the patient's history were reviewed and updated as appropriate: allergies, current medications, past family history, past medical history, past social history, past surgical history and problem list.   Objective:   Vitals:   11/17/21 0820  BP: 101/64  Pulse: 77  Weight: 179 lb (81.2 kg)    Fetal Status: Fetal Heart Rate (bpm): 143   Movement: Present     General:  Alert, oriented and cooperative. Patient is in no acute distress.  Skin: Skin is warm and dry. No rash noted.   Cardiovascular: Normal heart rate noted  Respiratory: Normal respiratory effort, no problems with respiration noted  Abdomen: Soft, gravid, appropriate for gestational age.  Pain/Pressure: Absent     Pelvic: Cervical exam deferred        Extremities: Normal range of motion.  Edema: Trace  Mental Status: Normal mood and affect. Normal behavior. Normal judgment and thought content.   Assessment and Plan:  Pregnancy: [id number] at [redacted]w[redacted]d 1. [redacted] weeks gestation of pregnancy - Antibody screen  2. Supervision of other normal pregnancy, antepartum FHT and FH normal - CBC - Glucose Tolerance, 2 Hours w/1 Hour - HIV Antibody (routine testing w rflx) - RPR - Antibody screen  3. Alpha thalassemia silent carrier  4. Abnormal fetal ultrasound Renal cyst resolved  5. Rh negative state in antepartum period - rho (d) immune globulin  (RHIG/RHOPHYLAC) injection 300 mcg - Antibody screen  Preterm labor symptoms and general obstetric precautions including but not limited to vaginal bleeding, contractions, leaking of fluid and fetal movement were reviewed in detail with the patient. Please refer to After Visit Summary for other counseling recommendations.   No follow-ups on file.  Future Appointments  Date Time Provider Department Center  11/30/2021  2:30 PM Levie Heritage, DO [facility] None  12/08/2021 10:55 AM Milas Hock, MD [facility] None  12/26/2021  2:30 PM [facility] NURSE [facility]  12/26/2021  2:45 PM [facility] US5 [facility]  12/29/2021  8:35 AM Anyanwu, Jethro Bastos, MD [facility] None  01/12/2022  8:35 AM Levie Heritage, DO [facility] None  01/26/2022  8:35 AM Levie Heritage, DO [facility] None    Levie Heritage, DO

## 2021-11-18 LAB — CBC
Hematocrit: 37.5 % (ref 34.0–46.6)
Hemoglobin: 12.5 g/dL (ref 11.1–15.9)
MCH: 29.1 pg (ref 26.6–33.0)
MCHC: 33.3 g/dL (ref 31.5–35.7)
MCV: 87 fL (ref 79–97)
Platelets: 183 x10E3/uL (ref 150–450)
RBC: 4.3 x10E6/uL (ref 3.77–5.28)
RDW: 11.8 % (ref 11.7–15.4)
WBC: 8.6 x10E3/uL (ref 3.4–10.8)

## 2021-11-18 LAB — ANTIBODY SCREEN: Antibody Screen: NEGATIVE

## 2021-11-18 LAB — HIV ANTIBODY (ROUTINE TESTING W REFLEX): HIV Screen 4th Generation wRfx: NONREACTIVE

## 2021-11-18 LAB — GLUCOSE TOLERANCE, 2 HOURS W/ 1HR
Glucose, 1 hour: 88 mg/dL (ref 70–179)
Glucose, 2 hour: 81 mg/dL (ref 70–152)
Glucose, Fasting: 74 mg/dL (ref 70–91)

## 2021-11-18 LAB — SYPHILIS: RPR W/REFLEX TO RPR TITER AND TREPONEMAL ANTIBODIES, TRADITIONAL SCREENING AND DIAGNOSIS ALGORITHM: RPR Ser Ql: NONREACTIVE

## 2021-11-30 ENCOUNTER — Ambulatory Visit (INDEPENDENT_AMBULATORY_CARE_PROVIDER_SITE_OTHER): Payer: Medicaid Other | Admitting: Family Medicine

## 2021-11-30 VITALS — BP 111/65 | HR 77 | Wt 184.0 lb

## 2021-11-30 DIAGNOSIS — O26899 Other specified pregnancy related conditions, unspecified trimester: Secondary | ICD-10-CM

## 2021-11-30 DIAGNOSIS — Z6791 Unspecified blood type, Rh negative: Secondary | ICD-10-CM

## 2021-11-30 DIAGNOSIS — D563 Thalassemia minor: Secondary | ICD-10-CM

## 2021-11-30 DIAGNOSIS — Z3480 Encounter for supervision of other normal pregnancy, unspecified trimester: Secondary | ICD-10-CM

## 2021-11-30 DIAGNOSIS — O283 Abnormal ultrasonic finding on antenatal screening of mother: Secondary | ICD-10-CM

## 2021-11-30 NOTE — Progress Notes (Signed)
   PRENATAL VISIT NOTE  Subjective:  Lindsay Phillips is a 26 y.o. [id number] at [redacted]w[redacted]d being seen today for ongoing prenatal care.  She is currently monitored for the following issues for this low-risk pregnancy and has High priority for COVID-19 virus vaccination; Rh negative state in antepartum period; Supervision of other normal pregnancy, antepartum; Alpha thalassemia silent carrier; and Abnormal fetal ultrasound on their problem list.  Patient reports no complaints.  Contractions: Not present. Vag. Bleeding: None.  Movement: Present. Denies leaking of fluid.   The following portions of the patient's history were reviewed and updated as appropriate: allergies, current medications, past family history, past medical history, past social history, past surgical history and problem list.   Objective:   Vitals:   11/30/21 1449  BP: 111/65  Pulse: 77  Weight: 184 lb (83.5 kg)    Fetal Status: Fetal Heart Rate (bpm): 135 Fundal Height: 28 cm Movement: Present  Presentation: Vertex  General:  Alert, oriented and cooperative. Patient is in no acute distress.  Skin: Skin is warm and dry. No rash noted.   Cardiovascular: Normal heart rate noted  Respiratory: Normal respiratory effort, no problems with respiration noted  Abdomen: Soft, gravid, appropriate for gestational age.  Pain/Pressure: Absent     Pelvic: Cervical exam deferred        Extremities: Normal range of motion.  Edema: Trace  Mental Status: Normal mood and affect. Normal behavior. Normal judgment and thought content.   Assessment and Plan:  Pregnancy: [id number] at [redacted]w[redacted]d 1. Supervision of other normal pregnancy, antepartum FHT and FH normal  2. Abnormal fetal ultrasound Renal cyst not visualized on last [place]. F/u on 8/1.  3. Alpha thalassemia silent carrier   4. Rh negative state in antepartum period Rhogam previously given.   Preterm labor symptoms and general obstetric precautions including but not limited to vaginal  bleeding, contractions, leaking of fluid and fetal movement were reviewed in detail with the patient. Please refer to After Visit Summary for other counseling recommendations.   No follow-ups on file.  Future Appointments  Date Time Provider Department Center  12/12/2021  8:55 AM Aviva Signs, CNM [facility] None  12/26/2021  2:30 PM [facility] NURSE [facility]  12/26/2021  2:45 PM [facility] US5 [facility]  12/29/2021  8:35 AM Anyanwu, Jethro Bastos, MD [facility] None  01/09/2022  8:55 AM Aviva Signs, CNM [facility] None  01/23/2022  8:55 AM Aviva Signs, CNM [facility] None  02/01/2022  8:55 AM Levie Heritage, DO [facility] None  02/06/2022  8:55 AM Gerrit Heck, CNM [facility] None  02/13/2022  8:55 AM Aviva Signs, CNM [facility] None  02/20/2022  8:55 AM Aviva Signs, CNM [facility] None    Levie Heritage, DO

## 2021-12-08 ENCOUNTER — Encounter: Payer: Medicaid Other | Admitting: Obstetrics and Gynecology

## 2021-12-12 ENCOUNTER — Encounter: Payer: Medicaid Other | Admitting: Advanced Practice Midwife

## 2021-12-26 ENCOUNTER — Ambulatory Visit: Payer: Medicaid Other | Admitting: *Deleted

## 2021-12-26 ENCOUNTER — Ambulatory Visit: Payer: Medicaid Other | Attending: Maternal & Fetal Medicine

## 2021-12-26 VITALS — BP 110/70 | HR 93

## 2021-12-26 DIAGNOSIS — Z3480 Encounter for supervision of other normal pregnancy, unspecified trimester: Secondary | ICD-10-CM | POA: Insufficient documentation

## 2021-12-26 DIAGNOSIS — Z3A32 32 weeks gestation of pregnancy: Secondary | ICD-10-CM | POA: Insufficient documentation

## 2021-12-26 DIAGNOSIS — Z148 Genetic carrier of other disease: Secondary | ICD-10-CM | POA: Diagnosis not present

## 2021-12-26 DIAGNOSIS — O35EXX0 Maternal care for other (suspected) fetal abnormality and damage, fetal genitourinary anomalies, not applicable or unspecified: Secondary | ICD-10-CM | POA: Diagnosis not present

## 2021-12-26 DIAGNOSIS — O283 Abnormal ultrasonic finding on antenatal screening of mother: Secondary | ICD-10-CM

## 2021-12-26 DIAGNOSIS — D563 Thalassemia minor: Secondary | ICD-10-CM

## 2021-12-26 DIAGNOSIS — Z362 Encounter for other antenatal screening follow-up: Secondary | ICD-10-CM | POA: Diagnosis present

## 2021-12-26 DIAGNOSIS — O358XX0 Maternal care for other (suspected) fetal abnormality and damage, not applicable or unspecified: Secondary | ICD-10-CM | POA: Diagnosis not present

## 2021-12-26 DIAGNOSIS — Q6100 Congenital renal cyst, unspecified: Secondary | ICD-10-CM | POA: Insufficient documentation

## 2021-12-26 DIAGNOSIS — O09893 Supervision of other high risk pregnancies, third trimester: Secondary | ICD-10-CM | POA: Diagnosis not present

## 2021-12-26 DIAGNOSIS — O285 Abnormal chromosomal and genetic finding on antenatal screening of mother: Secondary | ICD-10-CM | POA: Diagnosis not present

## 2021-12-29 ENCOUNTER — Telehealth (INDEPENDENT_AMBULATORY_CARE_PROVIDER_SITE_OTHER): Payer: Medicaid Other | Admitting: Obstetrics & Gynecology

## 2021-12-29 VITALS — BP 113/68 | HR 88

## 2021-12-29 DIAGNOSIS — O283 Abnormal ultrasonic finding on antenatal screening of mother: Secondary | ICD-10-CM

## 2021-12-29 DIAGNOSIS — Z3480 Encounter for supervision of other normal pregnancy, unspecified trimester: Secondary | ICD-10-CM

## 2021-12-29 DIAGNOSIS — Z3A32 32 weeks gestation of pregnancy: Secondary | ICD-10-CM

## 2021-12-29 NOTE — Patient Instructions (Signed)
Return to office for any scheduled appointments. Call the office or go to the MAU at [facility] if: You begin to have strong, frequent contractions Your water breaks.  Sometimes it is a big gush of fluid, sometimes it is just a trickle that keeps getting your underwear wet or running down your legs You have vaginal bleeding.  It is normal to have a small amount of spotting if your cervix was checked.  You do not feel your baby moving like normal.  If you do not, get something to eat and drink and lay down and focus on feeling your baby move.   If your baby is still not moving like normal, you should call the office or go to MAU. Any other obstetric concerns.  

## 2021-12-29 NOTE — Progress Notes (Signed)
OBSTETRICS PRENATAL VIRTUAL VISIT ENCOUNTER NOTE  Provider location: Center for [facility]   Patient location: Home  I connected with Lindsay Phillips on 12/29/21 at  8:35 AM EDT by MyChart Video Encounter and verified that I am speaking with the correct person using two identifiers. I discussed the limitations, risks, security and privacy concerns of performing an evaluation and management service virtually and the availability of in person appointments. I also discussed with the patient that there may be a patient responsible charge related to this service. The patient expressed understanding and agreed to proceed. Subjective:  Lindsay Phillips is a 26 y.o. [id number] at [redacted]w[redacted]d being seen today for ongoing prenatal care.  She is currently monitored for the following issues for this low-risk pregnancy and has Rh negative state in antepartum period; Supervision of other normal pregnancy, antepartum; and Alpha thalassemia silent carrier on their problem list.  Patient reports no complaints.  Contractions: Not present. Vag. Bleeding: None.  Movement: Present. Denies any leaking of fluid.   The following portions of the patient's history were reviewed and updated as appropriate: allergies, current medications, past family history, past medical history, past social history, past surgical history and problem list.   Objective:   Vitals:   12/29/21 0811  BP: 113/68  Pulse: 88    Fetal Status:     Movement: Present     General:  Alert, oriented and cooperative. Patient is in no acute distress.  Respiratory: Normal respiratory effort, no problems with respiration noted  Mental Status: Normal mood and affect. Normal behavior. Normal judgment and thought content.  Rest of physical exam deferred due to type of encounter  Imaging: [place] MFM OB FOLLOW UP  Result Date: 12/26/2021 ----------------------------------------------------------------------  OBSTETRICS REPORT                        (Signed Final 12/26/2021 03:15 pm) ---------------------------------------------------------------------- Patient Info  ID #:       [id number]                          D.O.B.:  1995-10-24 (26 yrs)  Name:       Lindsay Phillips                   Visit Date: 12/26/2021 02:30 pm ---------------------------------------------------------------------- Performed By  Attending:        Ma Rings MD         Secondary Phy.:   MARIE Lonia Farber CNM  Performed By:     Percell Boston          Address:          [address]                    RD[place]  [place], [place]  Referred By:      [facility]         Location:         [facility] at                                                             [facility]                                                             Women  Ref. Address:     [place] ---------------------------------------------------------------------- Orders  #  Description                           Code        Ordered By  1  [place] MFM OB FOLLOW UP                   [id number]    Sander Nephew ----------------------------------------------------------------------  #  Order #                     Accession #                Episode #  1  [id number]                   [id number]                 [id number] ---------------------------------------------------------------------- Indications  Fetal abnormality - other known or suspected   O35.9XX0  Encounter for other antenatal screening        Z36.2  follow-up  Genetic carrier (Alpha Thal Silent)            Z14.8  LR NIPS  Short interval between pregancies, 3rd         O09.893  trimester  [redacted] weeks gestation of pregnancy  Z3A.32 ---------------------------------------------------------------------- Fetal Evaluation  Num Of Fetuses:         1  Cardiac Activity:       Observed  Presentation:           Cephalic  Placenta:               Anterior  P. Cord Insertion:      Previously Visualized  Amniotic Fluid  AFI FV:      Within normal limits  AFI Sum(cm)     %Tile       Largest Pocket(cm)  12.88           38          4.09  RUQ(cm)       RLQ(cm)       LUQ(cm)        LLQ(cm)  2.83          2.82          3.14           4.09 ---------------------------------------------------------------------- Biometry  BPD:     81.47  mm     G. Age:  32w 5d         64  %    CI:        78.29   %    70 - 86                                                          FL/HC:      20.8   %    19.1 - 21.3  HC:    291.28   mm     G. Age:  32w 1d         17  %    HC/AC:      0.98        0.96 - 1.17  AC:    295.95   mm     G. Age:  33w 4d         88  %    FL/BPD:     74.3   %    71 - 87  FL:       60.5  mm     G. Age:  31w 3d         23  %    FL/AC:      20.4   %    20 - 24  Est. FW:    2039  gm      4 lb 8 oz     64  % ---------------------------------------------------------------------- OB History  Blood Type:   O-  Gravidity:    4         Term:   2        Prem:   0        SAB:   0  TOP:          0       Ectopic:  0        Living: 2 ---------------------------------------------------------------------- Gestational Age  LMP:           33w 4d        Date:  05/05/21                  EDD:  02/09/22  U/S Today:     32w 3d                                        EDD:   02/17/22  Best:          32w 0d     Det. By:  Early Exam  (07/11/21)   EDD:   02/20/22 ---------------------------------------------------------------------- Anatomy  Cranium:               Appears normal         LVOT:                   Previously seen  Cavum:                 Previously seen        Aortic Arch:            Previously seen  Ventricles:            Appears normal         Ductal Arch:             Previously seen  Choroid Plexus:        Previously seen        Diaphragm:              Appears normal  Cerebellum:            Previously seen        Stomach:                Appears normal, left                                                                        sided  Posterior Fossa:       Previously seen        Abdomen:                Previously seen  Nuchal Fold:           Not applicable ([id number]    Abdominal Wall:         Previously seen                         wks GA)  Face:                  Orbits and profile     Cord Vessels:           Previously seen                         previously seen  Lips:                  Previously seen        Kidneys:                Appear normal  Palate:                Not well visualized    Bladder:  Appears normal  Thoracic:              Appears normal         Spine:                  Previously seen  Heart:                 Previously seen        Upper Extremities:      Previously seen  RVOT:                  Previously seen        Lower Extremities:      Previously seen  Other:  Fetus appears to be female. VC, 3VV and 3VTV visualized. Nasal          bone, lenses, maxilla, mandible and falx visualized. Technically          difficult due to fetal position. previously ---------------------------------------------------------------------- Cervix Uterus Adnexa  Cervix  Not visualized (advanced GA >24wks)  Uterus  No abnormality visualized.  Right Ovary  No adnexal mass visualized.  Left Ovary  No adnexal mass visualized.  Cul De Sac  No free fluid seen.  Adnexa  No abnormality visualized. ---------------------------------------------------------------------- Comments  This patient was seen for a follow up exam as a possible  renal cyst was noted during her fetal anatomy scan.  She  denies any problems since her last exam.  She was informed that the fetal growth and amniotic fluid  level appears appropriate for her gestational age.  The renal cyst was not noted  today.  The patient was  reassured by today's findings.  No further exams were scheduled in our office. ----------------------------------------------------------------------                  Ma Rings, MD Electronically Signed Final Report   12/26/2021 03:15 pm ----------------------------------------------------------------------   Assessment and Plan:  Pregnancy: D3O6712 at [redacted]w[redacted]d 1. Abnormal fetal ultrasound Resolved renal cyst, patient reassured  2. [redacted] weeks gestation of pregnancy 3. Supervision of other normal pregnancy, antepartum No other concerns. Preterm labor symptoms and general obstetric precautions including but not limited to vaginal bleeding, contractions, leaking of fluid and fetal movement were reviewed in detail with the patient. I discussed the assessment and treatment plan with the patient. The patient was provided an opportunity to ask questions and all were answered. The patient agreed with the plan and demonstrated an understanding of the instructions. The patient was advised to call back or seek an in-person office evaluation/go to MAU at [facility] for any urgent or concerning symptoms. Please refer to After Visit Summary for other counseling recommendations.   I provided 7 minutes of face-to-face time during this encounter.  Return in about 11 days (around 01/09/2022) for OFFICE OB VISIT, TDap as scheduled.  Future Appointments  Date Time Provider Department Center  12/29/2021  8:35 AM Noriah Osgood, Jethro Bastos, MD [facility] None  01/09/2022  8:55 AM Aviva Signs, CNM [facility] None  01/23/2022  8:55 AM Aviva Signs, CNM [facility] None  02/01/2022  8:55 AM Levie Heritage, DO [facility] None  02/06/2022  8:55 AM Gerrit Heck, CNM [facility] None  02/13/2022  8:55 AM Aviva Signs, CNM [facility] None  02/20/2022  8:55 AM Aviva Signs, CNM [facility] None    Jaynie Collins, MD Center for [facility], [facility]

## 2022-01-03 ENCOUNTER — Telehealth: Payer: Self-pay

## 2022-01-03 ENCOUNTER — Encounter (HOSPITAL_COMMUNITY): Payer: Self-pay | Admitting: Obstetrics & Gynecology

## 2022-01-03 ENCOUNTER — Inpatient Hospital Stay (HOSPITAL_COMMUNITY)
Admission: AD | Admit: 2022-01-03 | Discharge: 2022-01-03 | Disposition: A | Discharge status: Home / Self Care | Payer: Medicaid Other | Attending: Obstetrics & Gynecology | Admitting: Obstetrics & Gynecology

## 2022-01-03 DIAGNOSIS — Z3689 Encounter for other specified antenatal screening: Secondary | ICD-10-CM

## 2022-01-03 DIAGNOSIS — Z3A33 33 weeks gestation of pregnancy: Secondary | ICD-10-CM | POA: Insufficient documentation

## 2022-01-03 DIAGNOSIS — O26893 Other specified pregnancy related conditions, third trimester: Secondary | ICD-10-CM | POA: Diagnosis not present

## 2022-01-03 DIAGNOSIS — R102 Pelvic and perineal pain: Secondary | ICD-10-CM | POA: Diagnosis present

## 2022-01-03 DIAGNOSIS — Z3480 Encounter for supervision of other normal pregnancy, unspecified trimester: Secondary | ICD-10-CM

## 2022-01-03 LAB — WET PREP, GENITAL
Clue Cells Wet Prep HPF POC: NONE SEEN
Sperm: NONE SEEN
Trich, Wet Prep: NONE SEEN
WBC, Wet Prep HPF POC: >=10 — AB (ref <10)
Yeast Wet Prep HPF POC: NONE SEEN

## 2022-01-03 LAB — URINALYSIS, ROUTINE W REFLEX MICROSCOPIC
Bilirubin Urine: NEGATIVE
Glucose, UA: 150 mg/dL — AB
Hgb urine dipstick: NEGATIVE
Ketones, ur: NEGATIVE mg/dL
Leukocytes,Ua: NEGATIVE
Nitrite: NEGATIVE
Protein, ur: NEGATIVE mg/dL
Specific Gravity, Urine: 1.024 (ref 1.005–1.030)
pH: 7 (ref 5.0–8.0)

## 2022-01-03 NOTE — MAU Note (Signed)
...  Lindsay Phillips is a 26 y.o. at [redacted]w[redacted]d here in MAU reporting: Sharp anterior pelvic pain that is worse with movement. She reports she was getting her nails done around 1050 this morning and went to stand up and the pain was so bad it made her "double over." She reports these pains have been occurring for the past four days. She reports sitting still the pain is still present but movement increases her pain. Denies VB or LOF. +FM.  She reports she never felt this with her first two pregnancies and is unsure if it is round ligament pain or not.   Onset of complaint: Four days Pain score:  1/10 pelvis - at rest 6/10 pelvis - with movement  FHT: 122 doppler Lab orders placed from triage: UA

## 2022-01-03 NOTE — Discharge Instructions (Signed)

## 2022-01-03 NOTE — Telephone Encounter (Signed)
-----   Message from Marti Sleigh, [place] sent at 01/03/2022  9:34 AM EDT ----- Regarding: Request Call Back Has a question for the nurse.

## 2022-01-03 NOTE — MAU Provider Note (Signed)
History     CSN: [id number]  Arrival date and time: 01/03/22 1133   Event Date/Time   First Provider Initiated Contact with Patient 01/03/22 1252      Chief Complaint  Patient presents with   Pelvic Pain   HPI Lindsay Phillips is a 26 y.o. U2V2536 at [redacted]w[redacted]d who presents to MAU with chief complaint of pelvic pain. This is a new problem, onset about four days ago and worsening over time. Patient states she was getting her nails done earlier today, stood up and felt immediate sharp pain in the middle of her pelvis "like lightning crotch". Pain worsens with walking, standing, repositioning. Pain almost completely resolves at rest. She has not taken medication or tried other treatments for this complaint.She denies contractions, vaginal bleeding, leaking of fluid, decreased fetal movement, fever, falls, or recent illness.   Patient receives care with [facility].  OB History     Gravida  4   Para  2   Term  2   Preterm      AB  1   Living  2      SAB      IAB  1   Ectopic      Multiple  0   Live Births  2           Past Medical History:  Diagnosis Date   Medical history non-contributory     Past Surgical History:  Procedure Laterality Date   INDUCED ABORTION     NO PAST SURGERIES      Family History  Problem Relation Age of Onset   Healthy Mother    Healthy Father    Hypertension Maternal Grandmother    Lupus Paternal Grandmother    Asthma Neg Hx    Birth defects Neg Hx    Cancer Neg Hx    Diabetes Neg Hx    Heart disease Neg Hx    Stroke Neg Hx     Social History   Tobacco Use   Smoking status: Never   Smokeless tobacco: Never  Vaping Use   Vaping Use: Never used  Substance Use Topics   Alcohol use: Not Currently    Comment: not in pregnancy   Drug use: Never    Allergies: No Known Allergies  Medications Prior to Admission  Medication Sig Dispense Refill Last Dose   ondansetron (ZOFRAN-ODT) 4 MG disintegrating tablet Take 1 tablet (4 mg  total) by mouth every 6 (six) hours as needed for nausea. 20 tablet 2    Prenatal Vit-Fe Fumarate-FA (PRENATAL VITAMINS) 28-0.8 MG TABS Take 1 tablet by mouth daily. 30 tablet 11     Review of Systems  Genitourinary:  Positive for pelvic pain.  All other systems reviewed and are negative.  Physical Exam   Blood pressure 106/64, pulse 89, temperature (!) 97.5 F (36.4 C), temperature source Oral, resp. rate 15, height 5\' 6"  (1.676 m), weight 86.8 kg, last menstrual period 05/05/2021, SpO2 100 %, currently breastfeeding.  Physical Exam Vitals and nursing note reviewed. Exam conducted with a chaperone present.  Constitutional:      Appearance: Normal appearance. She is not ill-appearing.  Cardiovascular:     Rate and Rhythm: Normal rate and regular rhythm.     Pulses: Normal pulses.     Heart sounds: Normal heart sounds.  Pulmonary:     Effort: Pulmonary effort is normal.     Breath sounds: Normal breath sounds.  Abdominal:     Tenderness: There is no  abdominal tenderness.     Comments: Gravid  Genitourinary:    Comments: Pelvic exam: External genitalia normal, vaginal walls pink and well rugated, cervix visually closed, no lesions noted. Scant thin white discharge near cervical os, potentially physiologic.   Skin:    Capillary Refill: Capillary refill takes less than 2 seconds.  Neurological:     Mental Status: She is alert and oriented to person, place, and time.     MAU Course  Procedures  MDM --Reactive tracing: baseline 130, mod var, + accels, no decels --Toco: irregular UI --OB hx term SVD x 2 with same partner --Discussed pubic symphysis pain, uterine irritability and contributing factors. Patient declined IV fluids, Tylenol, Flexeril --FFN not indicated due to quiet toco, closed cervix, hx term SVD x 2  Orders Placed This Encounter  Procedures   Wet prep, genital   Urinalysis, Routine w reflex microscopic Urine, Clean Catch   Discharge patient   Patient  Vitals for the past 24 hrs:  BP Temp Temp src Pulse Resp SpO2 Height Weight  01/03/22 1259 117/71 -- -- -- -- -- -- --  01/03/22 1218 106/64 -- -- 89 -- -- -- --  01/03/22 1201 (!) 105/57 (!) 97.5 F (36.4 C) Oral 86 15 100 % 5\' 6"  (1.676 m) 86.8 kg   Results for orders placed or performed during the hospital encounter of 01/03/22 (from the past 24 hour(s))  Urinalysis, Routine w reflex microscopic Urine, Clean Catch     Status: Abnormal   Collection Time: 01/03/22 12:12 PM  Result Value Ref Range   Color, Urine YELLOW YELLOW   APPearance CLEAR CLEAR   Specific Gravity, Urine 1.024 1.005 - 1.030   pH 7.0 5.0 - 8.0   Glucose, UA 150 (A) NEGATIVE mg/dL   Hgb urine dipstick NEGATIVE NEGATIVE   Bilirubin Urine NEGATIVE NEGATIVE   Ketones, ur NEGATIVE NEGATIVE mg/dL   Protein, ur NEGATIVE NEGATIVE mg/dL   Nitrite NEGATIVE NEGATIVE   Leukocytes,Ua NEGATIVE NEGATIVE  Wet prep, genital     Status: Abnormal   Collection Time: 01/03/22 12:41 PM   Specimen: PATH Cytology Cervicovaginal Ancillary Only  Result Value Ref Range   Yeast Wet Prep HPF POC NONE SEEN NONE SEEN   Trich, Wet Prep NONE SEEN NONE SEEN   Clue Cells Wet Prep HPF POC NONE SEEN NONE SEEN   WBC, Wet Prep HPF POC >=10 (A) <10   Sperm NONE SEEN    Assessment and Plan  --26 y.o. 34 at [redacted]w[redacted]d  --Cat I tracing --Closed cervix --Pubic symphysis pain in third trimester --Advised maternity belt worn low on pelvis, slow intentional position changes --Discharge home in stable condition  F/U: Next OB appt is 01/09/2022  01/11/2022, MSA, MSN, CNM 01/03/2022, 3:37 PM

## 2022-01-03 NOTE — Telephone Encounter (Signed)
Patient called and spoke with front desk Andris Flurry- relayed that she was having lower abdominal pain that is making her double over. Patient is 33 weeks. Advised that patient so go now to MAU at [facility] for evaluation. Armandina Stammer, RN

## 2022-01-04 LAB — GC/CHLAMYDIA PROBE AMP (~~LOC~~) NOT AT ARMC
Chlamydia: NEGATIVE
Comment: NEGATIVE
Comment: NORMAL
Neisseria Gonorrhea: NEGATIVE

## 2022-01-09 ENCOUNTER — Ambulatory Visit (INDEPENDENT_AMBULATORY_CARE_PROVIDER_SITE_OTHER): Payer: Medicaid Other | Admitting: Advanced Practice Midwife

## 2022-01-09 ENCOUNTER — Encounter: Payer: Self-pay | Admitting: Advanced Practice Midwife

## 2022-01-09 ENCOUNTER — Encounter: Payer: Self-pay | Admitting: General Practice

## 2022-01-09 VITALS — BP 126/75 | HR 76

## 2022-01-09 DIAGNOSIS — Z3A34 34 weeks gestation of pregnancy: Secondary | ICD-10-CM

## 2022-01-09 DIAGNOSIS — Z23 Encounter for immunization: Secondary | ICD-10-CM

## 2022-01-09 DIAGNOSIS — O26893 Other specified pregnancy related conditions, third trimester: Secondary | ICD-10-CM

## 2022-01-09 DIAGNOSIS — R102 Pelvic and perineal pain: Secondary | ICD-10-CM

## 2022-01-09 NOTE — Progress Notes (Signed)
   PRENATAL VISIT NOTE  Subjective:  Lindsay Phillips is a 26 y.o. Q3R0076 at [redacted]w[redacted]d being seen today for ongoing prenatal care.  She is currently monitored for the following issues for this low-risk pregnancy and has Rh negative state in antepartum period; Supervision of other normal pregnancy, antepartum; and Alpha thalassemia silent carrier on their problem list.  Patient reports  intermittent pubic bone pain .  Contractions: Not present. Vag. Bleeding: None.  Movement: Present. Denies leaking of fluid.   The following portions of the patient's history were reviewed and updated as appropriate: allergies, current medications, past family history, past medical history, past social history, past surgical history and problem list.   Objective:   Vitals:   01/09/22 0858  BP: 126/75  Pulse: 76    Fetal Status: Fetal Heart Rate (bpm): 130   Movement: Present     General:  Alert, oriented and cooperative. Patient is in no acute distress.  Skin: Skin is warm and dry. No rash noted.   Cardiovascular: Normal heart rate noted  Respiratory: Normal respiratory effort, no problems with respiration noted  Abdomen: Soft, gravid, appropriate for gestational age.  Pain/Pressure: Present     Pelvic: Cervical exam deferred        Extremities: Normal range of motion.  Edema: Trace  Mental Status: Normal mood and affect. Normal behavior. Normal judgment and thought content.   Assessment and Plan:  Pregnancy: A2Q3335 at [redacted]w[redacted]d 1. Pelvic pain affecting pregnancy in third trimester, antepartum     Discussed movement modifications      Pain is better  2. [redacted] weeks gestation of pregnancy   Preterm labor symptoms and general obstetric precautions including but not limited to vaginal bleeding, contractions, leaking of fluid and fetal movement were reviewed in detail with the patient. Please refer to After Visit Summary for other counseling recommendations.     Future Appointments  Date Time Provider  Department Center  01/23/2022  8:55 AM Aviva Signs, CNM [facility] None  02/01/2022  8:55 AM Levie Heritage, DO [facility] None  02/06/2022  8:55 AM Gerrit Heck, CNM [facility] None  02/13/2022  8:55 AM Aviva Signs, CNM [facility] None  02/20/2022  8:55 AM Aviva Signs, CNM [facility] None    Wynelle Bourgeois, CNM

## 2022-01-12 ENCOUNTER — Encounter: Payer: Medicaid Other | Admitting: Family Medicine

## 2022-01-23 ENCOUNTER — Other Ambulatory Visit (HOSPITAL_COMMUNITY)
Admission: RE | Admit: 2022-01-23 | Discharge: 2022-01-23 | Disposition: A | Discharge status: Home / Self Care | Payer: Medicaid Other | Source: Ambulatory Visit | Attending: Family Medicine | Admitting: Family Medicine

## 2022-01-23 ENCOUNTER — Encounter: Payer: Self-pay | Admitting: Advanced Practice Midwife

## 2022-01-23 ENCOUNTER — Ambulatory Visit (INDEPENDENT_AMBULATORY_CARE_PROVIDER_SITE_OTHER): Payer: Medicaid Other | Admitting: Advanced Practice Midwife

## 2022-01-23 VITALS — BP 104/62 | HR 72 | Wt 193.0 lb

## 2022-01-23 DIAGNOSIS — Z3A36 36 weeks gestation of pregnancy: Secondary | ICD-10-CM | POA: Insufficient documentation

## 2022-01-23 DIAGNOSIS — M899 Disorder of bone, unspecified: Secondary | ICD-10-CM

## 2022-01-23 DIAGNOSIS — O360931 Maternal care for other rhesus isoimmunization, third trimester, fetus 1: Secondary | ICD-10-CM

## 2022-01-23 DIAGNOSIS — O26893 Other specified pregnancy related conditions, third trimester: Secondary | ICD-10-CM | POA: Diagnosis not present

## 2022-01-23 DIAGNOSIS — M898X8 Other specified disorders of bone, other site: Secondary | ICD-10-CM

## 2022-01-23 NOTE — Progress Notes (Signed)
   PRENATAL VISIT NOTE  Subjective:  Lindsay Phillips is a 26 y.o. Q3E0923 at [redacted]w[redacted]d being seen today for ongoing prenatal care.  She is currently monitored for the following issues for this low-risk pregnancy and has Rh negative state in antepartum period; Supervision of other normal pregnancy, antepartum; and Alpha thalassemia silent carrier on their problem list.  Patient reports  vaginal discharge (thick) and pubic bone paini .  Contractions: Not present. Vag. Bleeding: None.  Movement: Present. Denies leaking of fluid.   The following portions of the patient's history were reviewed and updated as appropriate: allergies, current medications, past family history, past medical history, past social history, past surgical history and problem list.   Objective:   Vitals:   01/23/22 0854  BP: 104/62  Pulse: 72  Weight: 193 lb (87.5 kg)    Fetal Status: Fetal Heart Rate (bpm): 130 Fundal Height: 37 cm Movement: Present  Presentation: Vertex  General:  Alert, oriented and cooperative. Patient is in no acute distress.  Skin: Skin is warm and dry. No rash noted.   Cardiovascular: Normal heart rate noted  Respiratory: Normal respiratory effort, no problems with respiration noted  Abdomen: Soft, gravid, appropriate for gestational age.  Pain/Pressure: Present     Pelvic: Cervical exam performed in the presence of a chaperone Dilation: Fingertip Effacement (%): Thick Station: Ballotable  Extremities: Normal range of motion.  Edema: Trace  Mental Status: Normal mood and affect. Normal behavior. Normal judgment and thought content.   Assessment and Plan:  Pregnancy: G4P2012 at [redacted]w[redacted]d 1. [redacted] weeks gestation of pregnancy  - Culture, beta strep (group b only) - GC/Chlamydia probe amp ([place])not at [facility]  2. Pubic bone pain     Referred to PT - Ambulatory referral to Physical Therapy  Preterm labor symptoms and general obstetric precautions including but not limited to vaginal bleeding,  contractions, leaking of fluid and fetal movement were reviewed in detail with the patient. Please refer to After Visit Summary for other counseling recommendations.   Return in about 1 week (around 01/30/2022) for [employer].  Future Appointments  Date Time Provider Department Center  02/01/2022  8:55 AM Levie Heritage, DO [facility] None  02/06/2022  8:55 AM Gerrit Heck, CNM [facility] None  02/13/2022  8:55 AM Aviva Signs, CNM [facility] None  02/20/2022  8:55 AM Aviva Signs, CNM [facility] None    Wynelle Bourgeois, CNM

## 2022-01-24 ENCOUNTER — Encounter: Payer: Self-pay | Admitting: Advanced Practice Midwife

## 2022-01-24 LAB — GC/CHLAMYDIA PROBE AMP (~~LOC~~) NOT AT ARMC
Chlamydia: NEGATIVE
Comment: NEGATIVE
Comment: NORMAL
Neisseria Gonorrhea: NEGATIVE

## 2022-01-25 ENCOUNTER — Other Ambulatory Visit: Payer: Self-pay

## 2022-01-25 ENCOUNTER — Encounter: Payer: Self-pay | Admitting: Physical Therapy

## 2022-01-25 ENCOUNTER — Encounter: Payer: Medicaid Other | Attending: Advanced Practice Midwife | Admitting: Physical Therapy

## 2022-01-25 DIAGNOSIS — M62838 Other muscle spasm: Secondary | ICD-10-CM | POA: Diagnosis present

## 2022-01-25 DIAGNOSIS — M899 Disorder of bone, unspecified: Secondary | ICD-10-CM | POA: Diagnosis present

## 2022-01-25 NOTE — Therapy (Addendum)
OUTPATIENT PHYSICAL THERAPY FEMALE PELVIC EVALUATION   Patient Name: Lindsay Phillips MRN: [id number] DOB:01/10/1996, 26 y.o., female Today's Date: 01/25/2022   PT End of Session - 01/25/22 1117     Visit Number 1    Date for PT Re-Evaluation 02/22/22    Authorization Type [employer]    PT Start Time 1030    PT Stop Time 1115    PT Time Calculation (min) 45 min    Activity Tolerance Patient tolerated treatment well    Behavior During Therapy WFL for tasks assessed/performed             Past Medical History:  Diagnosis Date   Medical history non-contributory    Past Surgical History:  Procedure Laterality Date   INDUCED ABORTION     NO PAST SURGERIES     Patient Active Problem List   Diagnosis Date Noted   Alpha thalassemia silent carrier 09/15/2021   Supervision of other normal pregnancy, antepartum 07/11/2021   Rh negative state in antepartum period 02/10/2020    PCP: None  REFERRING PROVIDER: Seabron Spates, CNM  REFERRING DIAG: M89.9 (ICD-10-CM) - Pubic bone pain  THERAPY DIAG:  Other muscle spasm  Pubic bone pain  Rationale for Evaluation and Treatment Rehabilitation  ONSET DATE: 11/2021  SUBJECTIVE:                                                                                                                                                                                           SUBJECTIVE STATEMENT: Patient reports her lower abdominal pubic bone pain started July 2023. Patient pain came on suddenly. The pain has gotten gradually worse.  Fluid intake: Yes: water     PAIN:  Are you having pain? Yes NPRS scale: 8/10 Pain location:  lower abdominal and pubic bone area  Pain type: sharp Pain description: intermittent   Aggravating factors: bringing legs up, going up and down stairs, sitting and open legs to turn, getting out of bed, getting out of car, when legs go outward Relieving factors: sitting still  PRECAUTIONS: Other:  pregnant  WEIGHT BEARING RESTRICTIONS No  FALLS:  Has patient fallen in last 6 months? No  LIVING ENVIRONMENT: Lives with: lives with their family  OCCUPATION: none  PLOF: Independent  PATIENT GOALS find relief of pain until she give birth  PERTINENT HISTORY:  pregnant   BOWEL MOVEMENT Pain with bowel movement: Yes when she is straining Type of bowel movement:Type (Bristol Stool Scale) Type 1, Frequency every other day, and Strain Yes Fully empty rectum: No Leakage: No Pads: No Fiber supplement: Yes: Miralax  URINATION Pain with urination: No Fully  empty bladder: Yes:   Stream: Strong Urgency: Yes: pregnant Frequency: often due to being pregnant Leakage: Coughing and Sneezing; was prior to pregnancy but had therapy and stopped until she was pregnant Pads: No  INTERCOURSE sexually active with no issues  PREGNANCY Vaginal deliveries  2 Tearing No C-section deliveries 0 Currently pregnant Yes: due 02/20/2022  PROLAPSE None    OBJECTIVE:   DIAGNOSTIC FINDINGS:  none  PATIENT SURVEYS:  PFIQ-7 57; POPIQ-7 57  COGNITION:  Overall cognitive status: Within functional limits for tasks assessed     SENSATION:  Light touch: Appears intact  Proprioception: Appears intact   LUMBAR SPECIAL TESTS:  SI Compression/distraction test: Positive on right                POSTURE:  Pregnant posture   PELVIC ALIGNMENT:  LUMBARAROM/PROM Lumbar ROM is full.    LOWER EXTREMITY ROM: Right ilium rotation anteriorly  Passive ROM Right eval Left eval  Hip external rotation 40    (Blank rows = not tested)     PALPATION:   General  tenderness located on the right quadratus, tenderness located on the lower abdomen and pubic bone, right SI joint; right  ilium is rotated anteriorly                External Perineal Exam tenderness located on the right side of the ischiocavernosus                   TODAY'S TREATMENT  EVAL Date:  HEP established-see below   Correct the SI joint with muscle energy Education on keeping her knees together with transfers     PATIENT EDUCATION:  01/25/2022 Education details: Access Code: [email or web address] Person educated: Patient Education method: Explanation, Demonstration, Tactile cues, Verbal cues, and Handouts Education comprehension: verbalized understanding, returned demonstration, verbal cues required, tactile cues required, and needs further education   HOME EXERCISE PROGRAM: 01/25/2022 Access Code: [email or web address] URL: [email or web address] Date: 01/25/2022 Prepared by: Earlie Counts  Exercises - Sidelying Piriformis Stretch  - 1 x daily - 7 x weekly - 1 sets - 2 reps - 15 sec hold - Seated Lateral Trunk Stretch on Swiss Ball  - 1 x daily - 7 x weekly - 1 sets - 2 reps - 15 sec hold - Seated Hip Adduction Squeeze with Ball  - 1 x daily - 7 x weekly - 1 sets - 10 reps  ASSESSMENT:  CLINICAL IMPRESSION: Patient is a 26 y.o. female who was seen today for physical therapy evaluation and treatment for pubic bone pain. Patient reports  her pubic bone and lower abdominal pain started in 11/2021 suddenly. Her pain level is 8/10 when she moves her legs outward. She has tenderness located on the right quadratus, right SI joint, right lower abdomen, pubic bone and right ischiocavernosus. Her right ilium is rotated anteriorly and she has a positive compression test on the right. Right hip External rotation is 40 degrees. Patient is due on 02/20/2022. Patient would benefit from skilled therapy to improve her pubic bone pain and understands ways to manage it.    OBJECTIVE IMPAIRMENTS decreased activity tolerance, decreased endurance, decreased ROM, increased fascial restrictions, and pain.   ACTIVITY LIMITATIONS carrying, lifting, bending, standing, squatting, stairs, transfers, bed mobility, and dressing  PARTICIPATION LIMITATIONS: cleaning, laundry, driving, shopping, and community activity  PERSONAL FACTORS   pregnant  are also affecting patient's functional outcome.   REHAB POTENTIAL: Excellent  CLINICAL DECISION MAKING: Stable/uncomplicated  EVALUATION COMPLEXITY: Low   GOALS:  Goals reviewed with patient? Yes   LONG TERM GOALS: Target date: 02/22/2022   Patient is independent with her HEP for managing her pubic bone pain.  Baseline: not educated yet Goal status: INITIAL  2.  Patient is able to move her leg outward with pain level </= 5/10 due to pelvis in correct alignment.  Baseline: pain level 8/10 Goal status: INITIAL  3.  Patient understands how to do tranfers with her legs together to reduce strain on her pubic bone and reduce her pain.  Baseline: Not educated yet Goal status: INITIAL   PLAN: PT FREQUENCY: 1x/week  PT DURATION: 4 weeks  PLANNED INTERVENTIONS: Therapeutic exercises, Therapeutic activity, Neuromuscular re-education, Balance training, Self Care, Joint mobilization, Spinal mobilization, Cryotherapy, Moist heat, and Manual therapy  PLAN FOR NEXT SESSION: pubic bone pain exercises; manual work to right quadratus and below the pubic bone, hip rotation stretch on right.    Earlie Counts, PT 01/25/22 1:02 PM  PHYSICAL THERAPY DISCHARGE SUMMARY  Visits from Start of Care: 1  Current functional level related to goals / functional outcomes: Patient had her baby and does not need physical therapy at this time.    Remaining deficits: See above.    Education / Equipment: HEP   Patient agrees to discharge. Patient goals were not met. Patient is being discharged due to a change in medical status.Thank you for the referral. Earlie Counts, PT 02/14/22 8:19 AM

## 2022-01-26 ENCOUNTER — Encounter: Payer: Medicaid Other | Admitting: Family Medicine

## 2022-01-27 LAB — CULTURE, BETA STREP (GROUP B ONLY): Strep Gp B Culture: NEGATIVE

## 2022-01-31 ENCOUNTER — Ambulatory Visit: Payer: Medicaid Other | Admitting: Physical Therapy

## 2022-02-01 ENCOUNTER — Ambulatory Visit (INDEPENDENT_AMBULATORY_CARE_PROVIDER_SITE_OTHER): Payer: Medicaid Other | Admitting: Family Medicine

## 2022-02-01 VITALS — BP 106/63 | HR 87 | Wt 192.0 lb

## 2022-02-01 DIAGNOSIS — Z3A37 37 weeks gestation of pregnancy: Secondary | ICD-10-CM

## 2022-02-01 DIAGNOSIS — D563 Thalassemia minor: Secondary | ICD-10-CM

## 2022-02-01 DIAGNOSIS — Z6791 Unspecified blood type, Rh negative: Secondary | ICD-10-CM

## 2022-02-01 DIAGNOSIS — Z3480 Encounter for supervision of other normal pregnancy, unspecified trimester: Secondary | ICD-10-CM

## 2022-02-01 DIAGNOSIS — O99891 Other specified diseases and conditions complicating pregnancy: Secondary | ICD-10-CM | POA: Insufficient documentation

## 2022-02-01 DIAGNOSIS — Z8659 Personal history of other mental and behavioral disorders: Secondary | ICD-10-CM

## 2022-02-01 DIAGNOSIS — O26899 Other specified pregnancy related conditions, unspecified trimester: Secondary | ICD-10-CM

## 2022-02-01 NOTE — Progress Notes (Signed)
   PRENATAL VISIT NOTE  Subjective:  Lindsay Phillips is a 26 y.o. [id number] at [redacted]w[redacted]d being seen today for ongoing prenatal care.  She is currently monitored for the following issues for this low-risk pregnancy and has Rh negative state in antepartum period; Supervision of other normal pregnancy, antepartum; Alpha thalassemia silent carrier; and History of postpartum depression, currently pregnant in third trimester on their problem list.  Patient reports no complaints.  Contractions: Irritability. Vag. Bleeding: None.  Movement: Present. Denies leaking of fluid.   The following portions of the patient's history were reviewed and updated as appropriate: allergies, current medications, past family history, past medical history, past social history, past surgical history and problem list.   Objective:   Vitals:   02/01/22 0855  BP: 106/63  Pulse: 87  Weight: 192 lb (87.1 kg)    Fetal Status: Fetal Heart Rate (bpm): 132   Movement: Present     General:  Alert, oriented and cooperative. Patient is in no acute distress.  Skin: Skin is warm and dry. No rash noted.   Cardiovascular: Normal heart rate noted  Respiratory: Normal respiratory effort, no problems with respiration noted  Abdomen: Soft, gravid, appropriate for gestational age.  Pain/Pressure: Present     Pelvic: Cervical exam deferred        Extremities: Normal range of motion.  Edema: None  Mental Status: Normal mood and affect. Normal behavior. Normal judgment and thought content.   Assessment and Plan:  Pregnancy: B1D1761 at [redacted]w[redacted]d 1. [redacted] weeks gestation of pregnancy  2. Supervision of other normal pregnancy, antepartum FHT and FH normal  3. Rh negative state in antepartum period Rhogam given at 28 weeks  4. Alpha thalassemia silent carrier  5. History of postpartum depression, currently pregnant in third trimester Had severe PPD with first child, but didn't tell anyone.  PPD with second didn't last as long and wasn't as  severe. Would like to plan for something with this pregnancy - doesn't want to start medication but would like close followup. Will plan for follow up in 2 weeks in our office  Preterm labor symptoms and general obstetric precautions including but not limited to vaginal bleeding, contractions, leaking of fluid and fetal movement were reviewed in detail with the patient. Please refer to After Visit Summary for other counseling recommendations.   No follow-ups on file.  Future Appointments  Date Time Provider Department Center  02/06/2022  8:55 AM Gerrit Heck, CNM [facility] None  02/15/2022  8:35 AM Levie Heritage, DO [facility] None  02/20/2022  8:55 AM Aviva Signs, CNM [facility] None    Levie Heritage, DO

## 2022-02-05 ENCOUNTER — Encounter (HOSPITAL_COMMUNITY): Payer: Self-pay | Admitting: Family Medicine

## 2022-02-05 ENCOUNTER — Inpatient Hospital Stay (HOSPITAL_COMMUNITY)
Admission: AD | Admit: 2022-02-05 | Discharge: 2022-02-07 | DRG: 807 | Disposition: A | Discharge status: Home / Self Care | Payer: Medicaid Other | Attending: Family Medicine | Admitting: Family Medicine

## 2022-02-05 DIAGNOSIS — O26899 Other specified pregnancy related conditions, unspecified trimester: Secondary | ICD-10-CM

## 2022-02-05 DIAGNOSIS — D563 Thalassemia minor: Secondary | ICD-10-CM | POA: Diagnosis present

## 2022-02-05 DIAGNOSIS — O26893 Other specified pregnancy related conditions, third trimester: Secondary | ICD-10-CM | POA: Diagnosis present

## 2022-02-05 DIAGNOSIS — O99891 Other specified diseases and conditions complicating pregnancy: Secondary | ICD-10-CM

## 2022-02-05 DIAGNOSIS — Z6791 Unspecified blood type, Rh negative: Secondary | ICD-10-CM

## 2022-02-05 DIAGNOSIS — Z3480 Encounter for supervision of other normal pregnancy, unspecified trimester: Secondary | ICD-10-CM

## 2022-02-05 DIAGNOSIS — Z3A38 38 weeks gestation of pregnancy: Secondary | ICD-10-CM

## 2022-02-05 NOTE — MAU Note (Signed)
Pt says UC strong since 5pm PNC- [facility]-  VE last week-  1 cm Denies HSV GBS- neg

## 2022-02-06 ENCOUNTER — Encounter (HOSPITAL_COMMUNITY): Payer: Self-pay | Admitting: Family Medicine

## 2022-02-06 ENCOUNTER — Other Ambulatory Visit: Payer: Self-pay

## 2022-02-06 DIAGNOSIS — Z3A38 38 weeks gestation of pregnancy: Secondary | ICD-10-CM | POA: Diagnosis not present

## 2022-02-06 DIAGNOSIS — Z6791 Unspecified blood type, Rh negative: Secondary | ICD-10-CM | POA: Diagnosis not present

## 2022-02-06 DIAGNOSIS — D563 Thalassemia minor: Secondary | ICD-10-CM | POA: Diagnosis present

## 2022-02-06 DIAGNOSIS — O26893 Other specified pregnancy related conditions, third trimester: Secondary | ICD-10-CM | POA: Diagnosis present

## 2022-02-06 DIAGNOSIS — O4202 Full-term premature rupture of membranes, onset of labor within 24 hours of rupture: Secondary | ICD-10-CM | POA: Diagnosis not present

## 2022-02-06 LAB — CBC
HCT: 38.3 % (ref 36.0–46.0)
Hemoglobin: 12.4 g/dL (ref 12.0–15.0)
MCH: 28.6 pg (ref 26.0–34.0)
MCHC: 32.4 g/dL (ref 30.0–36.0)
MCV: 88.2 fL (ref 80.0–100.0)
Platelets: 154 10*3/uL (ref 150–400)
RBC: 4.34 MIL/uL (ref 3.87–5.11)
RDW: 13 % (ref 11.5–15.5)
WBC: 10.7 10*3/uL — ABNORMAL HIGH (ref 4.0–10.5)
nRBC: 0 % (ref 0.0–0.2)

## 2022-02-06 LAB — TYPE AND SCREEN
ABO/RH(D): O NEG
Antibody Screen: POSITIVE

## 2022-02-06 LAB — RPR: RPR Ser Ql: NONREACTIVE

## 2022-02-06 MED ORDER — LIDOCAINE HCL (PF) 1 % IJ SOLN: 30.0000 mL | INTRAMUSCULAR | Status: DC | PRN

## 2022-02-06 MED ORDER — TETANUS-DIPHTH-ACELL PERTUSSIS 5-2.5-18.5 LF-MCG/0.5 IM SUSY: 0.5000 mL | PREFILLED_SYRINGE | Freq: Once | INTRAMUSCULAR | Status: DC

## 2022-02-06 MED ORDER — SOD CITRATE-CITRIC ACID 500-334 MG/5ML PO SOLN: 30.0000 mL | ORAL | Status: DC | PRN

## 2022-02-06 MED ORDER — OXYTOCIN BOLUS FROM INFUSION
333.0000 mL | Freq: Once | INTRAVENOUS | Status: AC
  Administered 2022-02-06: 333 mL via INTRAVENOUS

## 2022-02-06 MED ORDER — FLEET ENEMA 7-19 GM/118ML RE ENEM: 1.0000 | ENEMA | RECTAL | Status: DC | PRN

## 2022-02-06 MED ORDER — DIPHENHYDRAMINE HCL 25 MG PO CAPS: 25.0000 mg | ORAL_CAPSULE | Freq: Four times a day (QID) | ORAL | Status: DC | PRN

## 2022-02-06 MED ORDER — RHO D IMMUNE GLOBULIN 1500 UNIT/2ML IJ SOSY
300.0000 ug | PREFILLED_SYRINGE | Freq: Once | INTRAMUSCULAR | Status: AC
  Administered 2022-02-06: 300 ug via INTRAVENOUS
  Filled 2022-02-06: qty 2

## 2022-02-06 MED ORDER — SIMETHICONE 80 MG PO CHEW: 80.0000 mg | CHEWABLE_TABLET | ORAL | Status: DC | PRN

## 2022-02-06 MED ORDER — SENNOSIDES-DOCUSATE SODIUM 8.6-50 MG PO TABS
2.0000 | ORAL_TABLET | Freq: Every day | ORAL | Status: DC
  Administered 2022-02-07: 2 via ORAL
  Filled 2022-02-06: qty 2

## 2022-02-06 MED ORDER — ACETAMINOPHEN 325 MG PO TABS: 650.0000 mg | ORAL_TABLET | ORAL | Status: DC | PRN

## 2022-02-06 MED ORDER — ONDANSETRON HCL 4 MG PO TABS: 4.0000 mg | ORAL_TABLET | ORAL | Status: DC | PRN

## 2022-02-06 MED ORDER — ONDANSETRON HCL 4 MG/2ML IJ SOLN: 4.0000 mg | INTRAMUSCULAR | Status: DC | PRN

## 2022-02-06 MED ORDER — OXYTOCIN-SODIUM CHLORIDE 30-0.9 UT/500ML-% IV SOLN
2.5000 [IU]/h | INTRAVENOUS | Status: DC
  Filled 2022-02-06: qty 500

## 2022-02-06 MED ORDER — DIBUCAINE (PERIANAL) 1 % EX OINT: 1.0000 | TOPICAL_OINTMENT | CUTANEOUS | Status: DC | PRN

## 2022-02-06 MED ORDER — ZOLPIDEM TARTRATE 5 MG PO TABS: 5.0000 mg | ORAL_TABLET | Freq: Every evening | ORAL | Status: DC | PRN

## 2022-02-06 MED ORDER — PRENATAL MULTIVITAMIN CH
1.0000 | ORAL_TABLET | Freq: Every day | ORAL | Status: DC
  Administered 2022-02-06 – 2022-02-07 (×2): 1 via ORAL
  Filled 2022-02-06 (×2): qty 1

## 2022-02-06 MED ORDER — IBUPROFEN 600 MG PO TABS
600.0000 mg | ORAL_TABLET | Freq: Four times a day (QID) | ORAL | Status: DC
  Administered 2022-02-06 – 2022-02-07 (×6): 600 mg via ORAL
  Filled 2022-02-06 (×7): qty 1

## 2022-02-06 MED ORDER — BENZOCAINE-MENTHOL 20-0.5 % EX AERO: 1.0000 | INHALATION_SPRAY | CUTANEOUS | Status: DC | PRN

## 2022-02-06 MED ORDER — LACTATED RINGERS IV SOLN: 500.0000 mL | INTRAVENOUS | Status: DC | PRN

## 2022-02-06 MED ORDER — OXYCODONE-ACETAMINOPHEN 5-325 MG PO TABS: 2.0000 | ORAL_TABLET | ORAL | Status: DC | PRN

## 2022-02-06 MED ORDER — WITCH HAZEL-GLYCERIN EX PADS: 1.0000 | MEDICATED_PAD | CUTANEOUS | Status: DC | PRN

## 2022-02-06 MED ORDER — LACTATED RINGERS IV SOLN: INTRAVENOUS | Status: DC

## 2022-02-06 MED ORDER — ONDANSETRON HCL 4 MG/2ML IJ SOLN: 4.0000 mg | Freq: Four times a day (QID) | INTRAMUSCULAR | Status: DC | PRN

## 2022-02-06 MED ORDER — COCONUT OIL OIL: 1.0000 | TOPICAL_OIL | Status: DC | PRN

## 2022-02-06 MED ORDER — OXYCODONE-ACETAMINOPHEN 5-325 MG PO TABS: 1.0000 | ORAL_TABLET | ORAL | Status: DC | PRN

## 2022-02-06 NOTE — Lactation Note (Signed)
This note was copied from a baby's chart. Lactation Consultation Note  Patient Name: Lindsay Phillips PCHEK'B Date: 02/06/2022 Reason for consult: Initial assessment Age:27 hours  P3, Experienced with breastfeeding.  Baby skin to skin. Suggest calling for latch assistance as needed. Feed on demand with cues.  Goal 8-12+ times per day after first 24 hrs.  Place baby STS if not cueing.  Mom made aware of O/P services, breastfeeding support groupand our phone # for post-discharge questions.    Maternal Data Has patient been taught Hand Expression?: Yes Does the patient have breastfeeding experience prior to this delivery?: Yes How long did the patient breastfeed?: 15 mos, 22 mos.  Feeding Mother's Current Feeding Choice: Breast Milk  Interventions Interventions: Arts administrator  Consult Status Consult Status: Follow-up Date: 02/07/22 Follow-up type: In-patient    Dahlia Byes [facility] 02/06/2022, 8:45 AM

## 2022-02-06 NOTE — Plan of Care (Signed)

## 2022-02-06 NOTE — Discharge Summary (Shared)
Postpartum Discharge Summary  Date of Service updated***     Patient Name: Lindsay Phillips DOB: 08-13-1995 MRN: [id number]  Date of admission: 02/05/2022 Delivery date:02/06/2022  Delivering provider: Shelda Pal  Date of discharge: 02/06/2022  Admitting diagnosis: Indication for care in labor or delivery [O75.9] Intrauterine pregnancy: [redacted]w[redacted]d    Secondary diagnosis:  Principal Problem:   Indication for care in labor or delivery Active Problems:   Rh negative state in antepartum period   Supervision of other normal pregnancy, antepartum   Alpha thalassemia silent carrier   History of postpartum depression, currently pregnant in third trimester  Additional problems: ***    Discharge diagnosis: Term Pregnancy Delivered                                              Post partum procedures:{Postpartum procedures:23558} Augmentation: N/A Complications: None  Hospital course: Onset of Labor With Vaginal Delivery      26y.o. yo [id number] [id number] admitted in Latent Labor on 02/05/2022. Patient had an uncomplicated labor course as follows:  Membrane Rupture Time/Date: 12:25 AM ,02/06/2022   Delivery Method:Vaginal, Spontaneous  Episiotomy: None  Lacerations:  None  Patient had an uncomplicated postpartum course.  She is ambulating, tolerating a regular diet, passing flatus, and urinating well. Patient is discharged home in stable condition on 02/06/22.  Newborn Data: Birth date:02/06/2022  Birth time:12:42 AM  Gender:Female  Living status:Living  Apgars:8 ,9  Weight:2840 g   Magnesium Sulfate received: No BMZ received: No Rhophylac:Yes MMR: Immune T-DaP:Given prenatally Flu: No Transfusion:{Transfusion received:30440034}  Physical exam  Vitals:   02/06/22 0132 02/06/22 0145 02/06/22 0230 02/06/22 0304  BP: 124/69 117/67 110/65 102/78  Pulse: 77 75 74 75  Resp: '15 17 16 18  ' Temp:   98 F (36.7 C) 99.1 F (37.3 C)  TempSrc:   Oral Oral  SpO2:    100%   Weight:      Height:       General: {Exam; general:21111117} Lochia: {Desc; appropriate/inappropriate:30686::"appropriate"} Uterine Fundus: {Desc; firm/soft:30687} Incision: {Exam; incision:21111123} DVT Evaluation: {Exam; dvt:2111122} Labs: Lab Results  Component Value Date   WBC 10.7 (H) 02/06/2022   HGB 12.4 02/06/2022   HCT 38.3 02/06/2022   MCV 88.2 02/06/2022   PLT 154 02/06/2022      Latest Ref Rng & Units 10/23/2021    9:21 PM  CMP  Glucose 70 - 99 mg/dL 92   BUN 6 - 20 mg/dL 7   Creatinine 0.44 - 1.00 mg/dL 0.68   Sodium 135 - 145 mmol/L 136   Potassium 3.5 - 5.1 mmol/L 3.5   Chloride 98 - 111 mmol/L 106   CO2 22 - 32 mmol/L 22   Calcium 8.9 - 10.3 mg/dL 8.3    Edinburgh Score:    10/18/2020    9:36 AM  Edinburgh Postnatal Depression Scale Screening Tool  I have been able to laugh and see the funny side of things. 0  I have looked forward with enjoyment to things. 0  I have blamed myself unnecessarily when things went wrong. 1  I have been anxious or worried for no good reason. 1  I have felt scared or panicky for no good reason. 0  Things have been getting on top of me. 0  I have been so unhappy that I have had difficulty sleeping.  0  I have felt sad or miserable. 0  I have been so unhappy that I have been crying. 0  The thought of harming myself has occurred to me. 0  Edinburgh Postnatal Depression Scale Total 2     After visit meds:  Allergies as of 02/06/2022   No Known Allergies   Med Rec must be completed prior to using this [facility]***        Discharge home in stable condition Infant Feeding: {Baby feeding:23562} Infant Disposition:{CHL IP OB HOME WITH ODGWZD:90172} Discharge instruction: per After Visit Summary and Postpartum booklet. Activity: Advance as tolerated. Pelvic rest for 6 weeks.  Diet: routine diet Future Appointments: Future Appointments  Date Time Provider [place]  02/06/2022  8:55 AM Gavin Pound, CNM  [facility] None  02/15/2022  8:35 AM Truett Mainland, DO [facility] None  02/20/2022  8:55 AM Seabron Spates, CNM [facility] None   Follow up Visit:  Message sent to [facility] on 02/06/22--Jessica Q Mercado-Ortiz, DO  Please schedule this patient for a In person postpartum visit in 6 weeks with the following provider: Any provider. Additional Postpartum F/U:Postpartum Depression checkup  Low risk pregnancy complicated by:  n/a Delivery mode:  Vaginal, Spontaneous  Anticipated Birth Control:   vasectomy   02/06/2022 Shelda Pal, DO

## 2022-02-06 NOTE — H&P (Addendum)
OBSTETRIC ADMISSION HISTORY AND PHYSICAL  Lindsay Phillips is a 26 y.o. female [phone] with IUP at [redacted]w[redacted]d by LMP (05/05/2021) presenting for SVD 2/2 SROM. She reports +FMs, + LOF, no VB, no blurry vision, headaches or peripheral edema, and RUQ pain.  She plans on breast feeding. She reports vasectomy as birth control. She received her prenatal care at [facility]  Dating: By LMP --->  Estimated Date of Delivery: 02/20/22  Sono:    @[redacted]w[redacted]d , CWD, normal anatomy, Cephalic presentation, Anterior lie, 2039g, 64% EFW   Prenatal History/Complications: History of postpartum depression.  Past Medical History: Past Medical History:  Diagnosis Date   Medical history non-contributory     Past Surgical History: Past Surgical History:  Procedure Laterality Date   INDUCED ABORTION     NO PAST SURGERIES      Obstetrical History: OB History     Gravida  4   Para  2   Term  2   Preterm      AB  1   Living  2      SAB      IAB  1   Ectopic      Multiple  0   Live Births  2           Social History Social History   Socioeconomic History   Marital status: Married    Spouse name: Not on file   Number of children: 1   Years of education: Not on file   Highest education level: Master's degree (e.g., MA, MS, MEng, MEd, MSW, MBA)  Occupational History   Not on file  Tobacco Use   Smoking status: Never   Smokeless tobacco: Never  Vaping Use   Vaping Use: Never used  Substance and Sexual Activity   Alcohol use: Not Currently    Comment: not in pregnancy   Drug use: Never   Sexual activity: Yes    Birth control/protection: None  Other Topics Concern   Not on file  Social History Narrative   Not on file   Social Determinants of Health   Financial Resource Strain: Low Risk  (12/03/2018)   Overall Financial Resource Strain (CARDIA)    Difficulty of Paying Living Expenses: Not hard at all  Food Insecurity: No Food Insecurity (12/03/2018)   Hunger Vital Sign    Worried  About Running Out of Food in the Last Year: Never true    Ran Out of Food in the Last Year: Never true  Transportation Needs: No Transportation Needs (12/03/2018)   PRAPARE - 02/03/2019 (Medical): No    Lack of Transportation (Non-Medical): No  Physical Activity: Not on file  Stress: Not on file  Social Connections: Not on file    Family History: Family History  Problem Relation Age of Onset   Healthy Mother    Healthy Father    Hypertension Maternal Grandmother    Lupus Paternal Grandmother    Asthma Neg Hx    Birth defects Neg Hx    Cancer Neg Hx    Diabetes Neg Hx    Heart disease Neg Hx    Stroke Neg Hx     Allergies: No Known Allergies  Medications Prior to Admission  Medication Sig Dispense Refill Last Dose   Prenatal Vit-Fe Fumarate-FA (PRENATAL VITAMINS) 28-0.8 MG TABS Take 1 tablet by mouth daily. 30 tablet 11 02/04/2022   ondansetron (ZOFRAN-ODT) 4 MG disintegrating tablet Take 1 tablet (4 mg total) by mouth every  6 (six) hours as needed for nausea. (Patient not taking: Reported on 02/01/2022) 20 tablet 2 More than a month     Review of Systems   All systems reviewed and negative except as stated in HPI  Blood pressure 111/68, pulse 89, temperature 97.8 F (36.6 C), temperature source Oral, resp. rate 20, height 5\' 6"  (1.676 m), weight 88.5 kg, last menstrual period 05/05/2021, SpO2 99 %, currently breastfeeding. General appearance: alert and cooperative Lungs: clear to auscultation bilaterally Heart: regular rate and rhythm Abdomen: soft, non-tender; bowel sounds normal Pelvic: Dilation: 10 cm, Effacement: 90%, Station: -2 Extremities: Homans sign is negative, no sign of DVT Presentation: cephalic Fetal monitoring Baseline: 135 bpm, Variability: Good {> 6 bpm), Accelerations: Reactive, and Decelerations: Early Uterine activity: Frequency: Every 3 minutes Duration: 2 minutes and Intensity: strong Dilation: 5.5 Effacement (%):  90 Station: -2 Exam by:: K.Wilson,RN   Prenatal labs: ABO, Rh: O/Negative/-- (02/14 1141) Antibody: Negative (06/23 0845) Rubella: 2.38 (02/14 1141) RPR: Non Reactive (06/23 0845)  HBsAg: Negative (02/14 1141)  HIV: Non Reactive (06/23 0845)  GBS: Negative/-- (08/29 0912)  1 hr Glucola: Third trimester - normal 2hr Genetic screening  NIPS: LR/F, Horizon: Silent alpha thal carrier Anatomy 07-10-2003 Normal (Renal cyst, resolved)  Prenatal Transfer Tool  Maternal Diabetes: No Genetic Screening: NIPS: LR/F, Horizon: Silent alpha thal carrier Maternal Ultrasounds/Referrals: Normal Fetal Ultrasounds or other Referrals:  None Maternal Substance Abuse:  No Significant Maternal Medications:  None Significant Maternal Lab Results:  Group B Strep negative and Rh negative Number of Prenatal Visits:greater than 3 verified prenatal visits Other Comments:  None  No results found for this or any previous visit (from the past 24 hour(s)).  Patient Active Problem List   Diagnosis Date Noted   History of postpartum depression, currently pregnant in third trimester 02/01/2022   Alpha thalassemia silent carrier 09/15/2021   Supervision of other normal pregnancy, antepartum 07/11/2021   Rh negative state in antepartum period 02/10/2020    Assessment/Plan:  Lindsay Phillips is a 26 y.o. 34 at [redacted]w[redacted]d here for SVD.   #Labor: Progressing well. Will provide expectant management. #Pain: Declines pain medication.  #FWB: Cat 1 #ID:  GBS negative #MOF: Breast #MOC: vasectomy  [redacted]w[redacted]d, Medical Student  02/06/2022, 12:08 AM  ___________ 04/08/2022 of Supervision of Student:  I confirm that I have verified the information documented in the medical student's note and that I have also personally reperformed the history, physical exam and all medical decision making activities.  I have verified that all services and findings are accurately documented in this student's note; and I agree with management  and plan as outlined in the documentation. I have also made any necessary editorial changes.   Flonnie Hailstone, DO [facility], [facility] 02/06/2022 4:10 AM

## 2022-02-06 NOTE — Lactation Note (Signed)
This note was copied from a baby's chart. Lactation Consultation Note  Patient Name: Girl Mykelle Cockerell SAYTK'Z Date: 02/06/2022   Age:27 hours It wasn't down if mom wanted to see Lactation or not. Lactation asked RN. Mom stated she was OK for now and didn't need Lactation.  Maternal Data    Feeding    LATCH Score                    Lactation Tools Discussed/Used    Interventions    Discharge    Consult Status      Charyl Dancer 02/06/2022, 2:53 AM

## 2022-02-07 LAB — RH IG WORKUP (INCLUDES ABO/RH)
Fetal Screen: NEGATIVE
Gestational Age(Wks): 38
Unit division: 0

## 2022-02-07 MED ORDER — ACETAMINOPHEN 325 MG PO TABS: 650.0000 mg | ORAL_TABLET | ORAL | 0 refills | Status: DC | PRN

## 2022-02-07 MED ORDER — IBUPROFEN 600 MG PO TABS: 600.0000 mg | ORAL_TABLET | Freq: Four times a day (QID) | ORAL | 0 refills | Status: AC

## 2022-02-07 MED ORDER — DOCUSATE SODIUM 100 MG PO CAPS: 100.0000 mg | ORAL_CAPSULE | Freq: Every day | ORAL | 0 refills | Status: DC

## 2022-02-07 MED ORDER — DOCUSATE SODIUM 100 MG PO CAPS: 100.0000 mg | ORAL_CAPSULE | Freq: Every day | ORAL | Status: DC

## 2022-02-07 NOTE — Social Work (Addendum)
CSW received consult for hx of Postpartum Depression.  CSW met with MOB to offer support and complete assessment.    CSW met with MOB at bedside and introduced CSW role. CSW observed MOB in bed holding the infant and FOB at bedside. MOB presented calm and welcomed CSW visit with FOB present. MOB engaged with CSW throughout the visit. CSW inquired how MOB has felt since giving birth. MOB reported has been feeling good and shared during the L&D "the baby came really fast." CSW inquired how MOB felt during the pregnancy. MOB shared "at the beginning things were rough." CSW assessed further. MOB explained she felt sick and thought, "I don't know if I can do this." MOB reported she started seeing therapist "Tiffany" at the [facility] every other week and feels the sessions have been helpful. MOB reported her next appointment is scheduled for next week but had decided to change the appointment to the following week. MOB shared that she had severe PPD after the birth of her first child. MOB expressed that it was the peak of covid, she had intrusive thoughts with no access to resources. MOB expressed her second pregnancy was a "different experience" she had no intrusive thoughts and felt more capable of raising her children. CSW provided active listening and validated MOB concerns. CSW encouraged MOB efforts to continue therapy and contact her doctor if concerns arise. MOB identified her spouse as her primary support. CSW provided education regarding the baby blues period vs. perinatal mood disorders, discussed treatment and gave resources for mental health follow up if concerns arise.  CSW recommended MOB completed a self-evaluation during the postpartum time period using the New Mom Checklist from Postpartum Progress and encouraged MOB to contact a medical professional if symptoms are noted at any time. CSW assessed MOB for safety. MOB denied thoughts of harm to self and others.   MOB reported she has all essential  items to care for the infant including a bassinet where the infant will sleep. CSW provided review of Sudden Infant Death Syndrome (SIDS) precautions. MOB has chosen [facility] for the infant's follow up care. CSW assessed MOB for additional needs. MOB reported no further need. MOB was appreciative of CSW visit.   CSW identifies no further need for intervention and no barriers to discharge at this time.   Kathrin Greathouse, MSW, LCSW [facility] [employer]  [phone] 02/07/2022  12:58 PM

## 2022-02-08 ENCOUNTER — Ambulatory Visit: Payer: Self-pay

## 2022-02-08 NOTE — Lactation Note (Signed)
This note was copied from a baby's chart. Lactation Consultation Note  Patient Name: Lindsay Phillips [id number] Date: 02/08/2022 Reason for consult: Follow-up assessment;Early term 37-38.6wks Age:27 hours  Mom's milk is coming to volume. She stated this is the earliest her milk has come in (Mom was lactating for her 2nd child until 4 months ago). Mom feels some relief at breast once infant is finished feeding. Her R breast palpates full, but she is comfortable (infant currently at L breast). Mom has a hx of abundant milk supply. Swallows are visible to the eye, but also audible to the naked ear.   Infant's stools are seedy and turning yellow, per maternal report. Nipples appear intact. Mom has no questions for me.   Infant is nursing very well. I have no concerns.   Maternal Data Does the patient have breastfeeding experience prior to this delivery?: Yes How long did the patient breastfeed?: see previous notation  Feeding Mother's Current Feeding Choice: Breast Milk  LATCH Score Latch: Repeated attempts needed to sustain latch, nipple held in mouth throughout feeding, stimulation needed to elicit sucking reflex.  Audible Swallowing: Spontaneous and intermittent  Type of Nipple: Everted at rest and after stimulation  Comfort (Breast/Nipple): Soft / non-tender  Hold (Positioning): No assistance needed to correctly position infant at breast.  LATCH Score: 9   Lactation Tools Discussed/Used: Mom may need a size 21 flange for her Lansinoh pump    Interventions    Discharge Discharge Education: Other (comment) (Made aware of [name]; has our phone # for post-d/c questions) Pump: Personal (Lansinoh pump at home)  Consult Status Consult Status: Complete    Remigio Eisenmenger 02/08/2022, 8:12 AM

## 2022-02-13 ENCOUNTER — Encounter: Payer: Medicaid Other | Admitting: Advanced Practice Midwife

## 2022-02-14 ENCOUNTER — Telehealth (HOSPITAL_COMMUNITY): Payer: Self-pay | Admitting: *Deleted

## 2022-02-14 IMAGING — US US MFM OB COMP +14 WKS
1 series · 13 of 28 positions shown · non-contrast
Comparison: none

[Series 1: us mfm ob comp +14 wks · 13 of 130 slices shown]
[im 5/130]
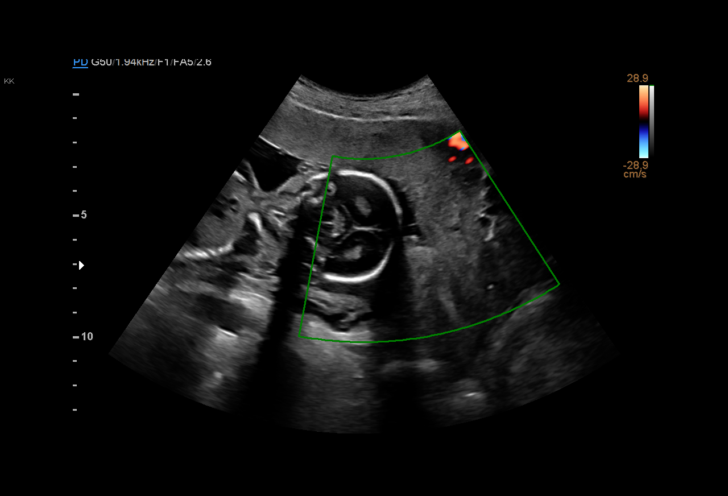
[im 15/130]
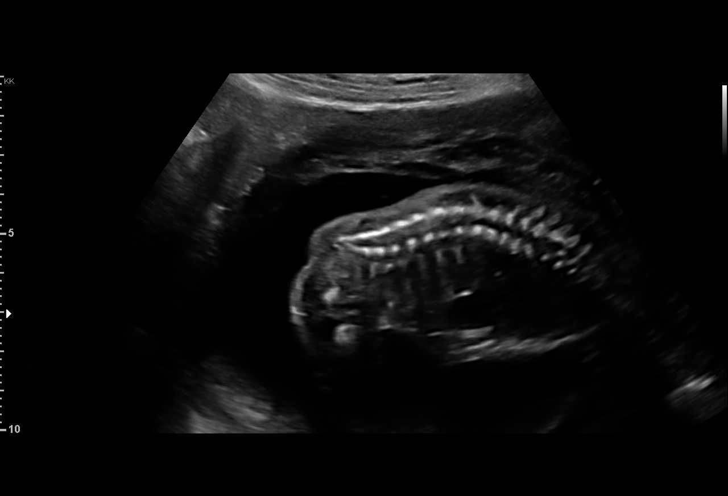
[im 24/130]
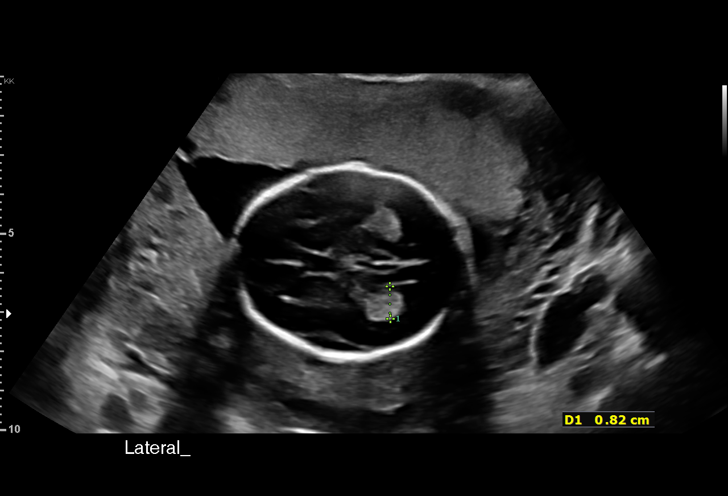
[im 34/130]
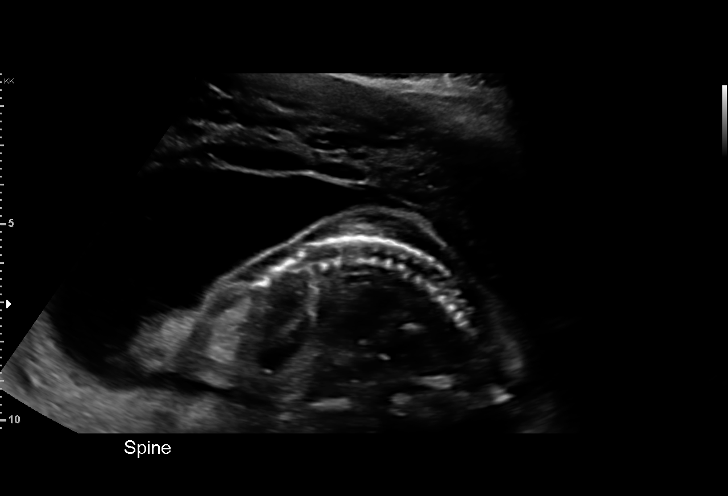
[im 44/130]
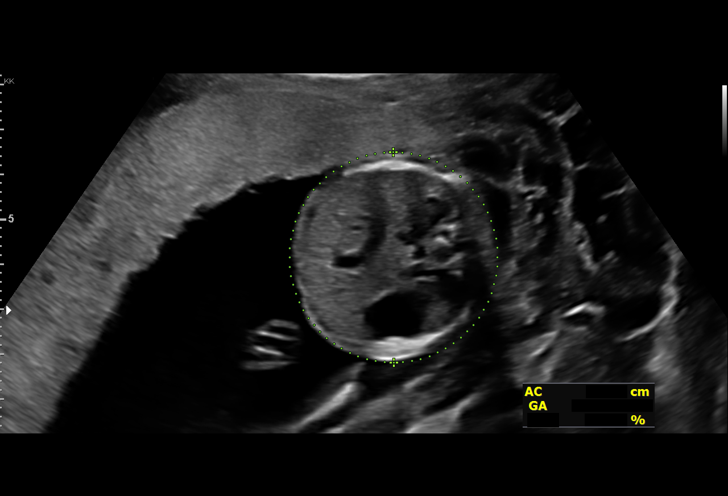
[im 53/130]
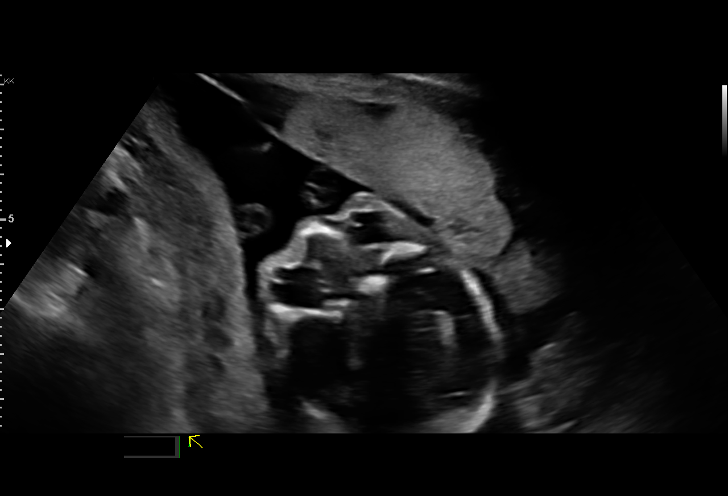
[im 67/130]
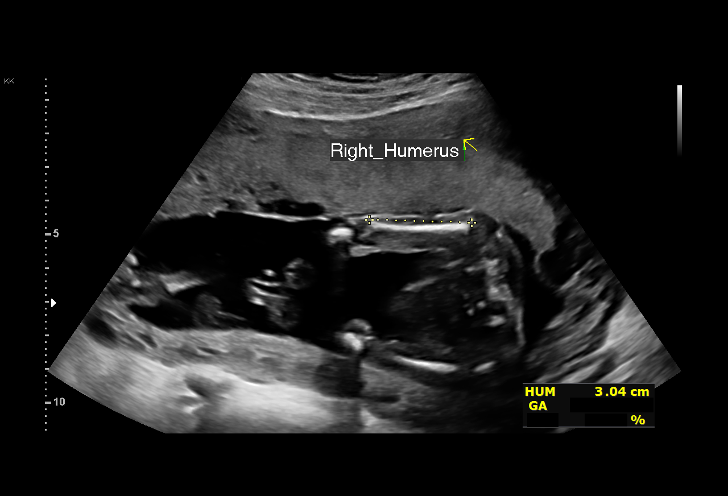
[im 77/130]
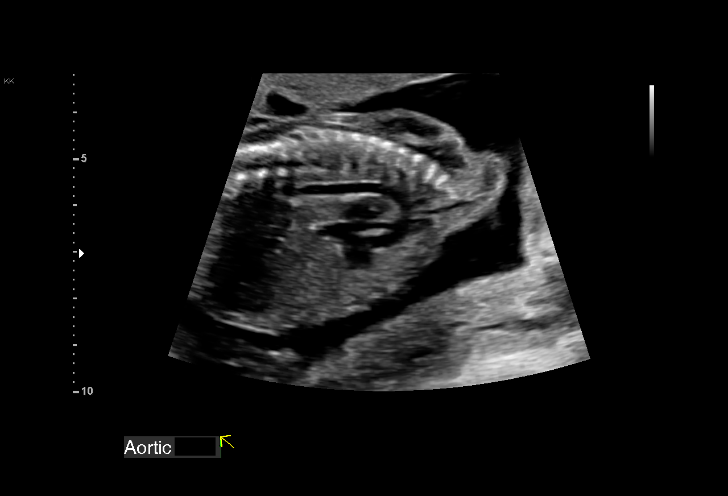
[im 87/130]
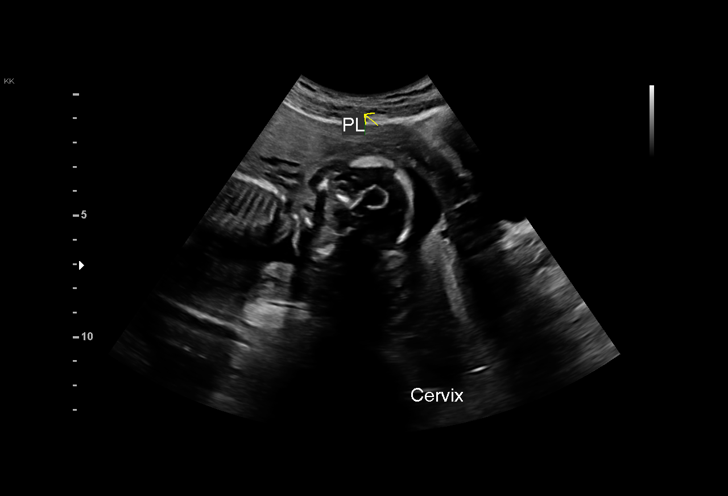
[im 96/130]
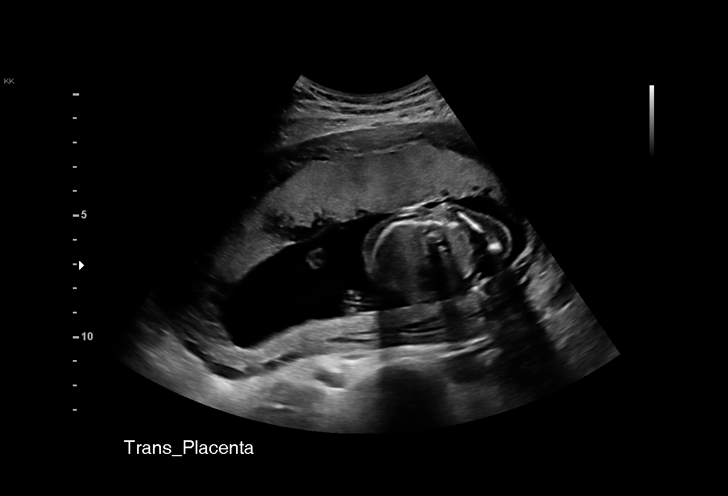
[im 106/130]
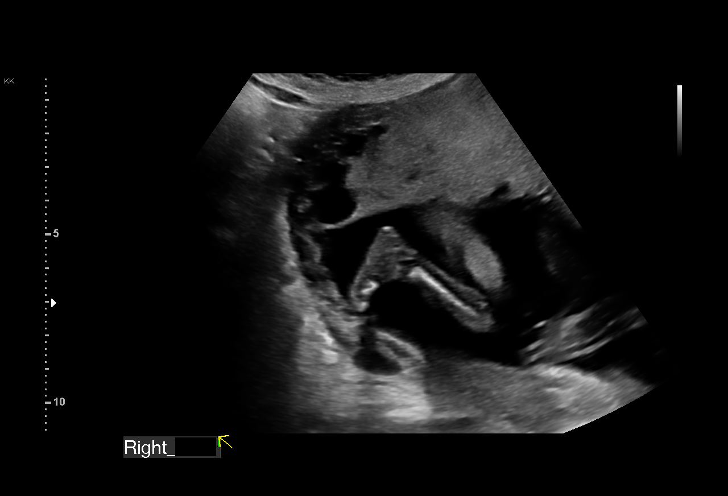
[im 115/130]
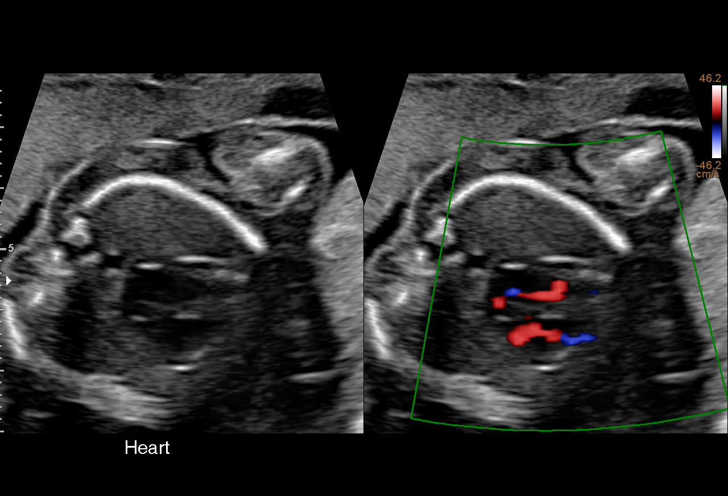
[im 125/130]
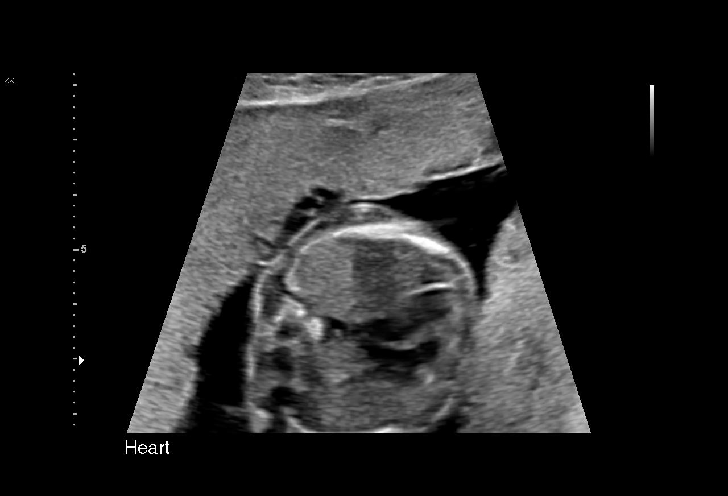

[13 of 28 positions shown; findings below may reference images not displayed]

Ref. Address:     Faculty

 1  US MFM OB COMP + 14 WK                [address]    [facility]

Indications

 Short interval between pregnancies
 complicating pregnancy, antepartum
 Encounter for antenatal screening for
 malformations
 Negative Horizon (4 of 4)
 Rh negative state in antepartum
 20 weeks gestation of pregnancy
Fetal Evaluation

 Num Of Fetuses:         1
 Fetal Heart Rate(bpm):  155
 Cardiac Activity:       Observed
 Presentation:           Cephalic
 Placenta:               Anterior
 P. Cord Insertion:      Visualized

 Amniotic Fluid
 AFI FV:      Within normal limits

                             Largest Pocket(cm)

Biometry

 BPD:      46.7  mm     G. Age:  20w 1d         55  %    CI:         71.4   %    70 - 86
                                                         FL/HC:      17.9   %    16.8 -
 HC:       176   mm     G. Age:  20w 1d         47  %    HC/AC:      1.18        1.09 -
 AC:      149.6  mm     G. Age:  20w 1d         52  %    FL/BPD:     67.5   %
 FL:       31.5  mm     G. Age:  19w 6d         34  %    FL/AC:      21.1   %    20 - 24
 HUM:      30.4  mm     G. Age:  20w 0d         55  %

 Est. FW:     328  gm    0 lb 12 oz      47  %
OB History

 Gravidity:    3         Term:   1        Prem:   0        SAB:   0
 TOP:          1       Ectopic:  0        Living: 1
Gestational Age

 LMP:           20w 0d        Date:  12/08/19                 EDD:   09/13/20
 U/S Today:     20w 1d                                        EDD:   09/12/20
 Best:          20w 0d     Det. By:  LMP  (12/08/19)          EDD:   09/13/20
Anatomy

 Cranium:               Appears normal         Aortic Arch:            Appears normal
 Cavum:                 Appears normal         Ductal Arch:            Appears normal
 Ventricles:            Appears normal         Diaphragm:              Appears normal
 Choroid Plexus:        Appears normal         Stomach:                Appears normal, left
                                                                       sided
 Cerebellum:            Appears normal         Abdomen:                Appears normal
 Posterior Fossa:       Appears normal         Abdominal Wall:         Appears nml (cord
                                                                       insert, abd wall)
 Nuchal Fold:           Appears normal         Cord Vessels:           Appears normal (3
                                                                       vessel cord)
 Face:                  Appears normal         Kidneys:                Appear normal
                        (orbits and profile)
 Lips:                  Not well visualized    Bladder:                Appears normal
 Thoracic:              Appears normal         Spine:                  Appears normal
 Heart:                 Not well visualized    Upper Extremities:      Appears normal
 RVOT:                  Not well visualized    Lower Extremities:      Appears normal
 LVOT:                  Not well visualized

 Other:  Fetus appears to be a male. Heels visualized. Hands not well
         visualized. Technically difficult due to fetal position.
Cervix Uterus Adnexa

 Cervix
 Length:           3.07  cm.
 Normal appearance by transabdominal scan.

 Adnexa
 No abnormality visualized.
Impression

 Addendum to report.

 Single intrauterine pregnancy here for a detailed anatomy
 Normal anatomy with measurements consistent with dates
 There is good fetal movement and amniotic fluid volume
 Suboptimal views of the fetal anatomy were obtained
 secondary to fetal position
Recommendations

 Follow up growth in 4 weeks to clear fetal anatomy.

## 2022-02-14 NOTE — Telephone Encounter (Signed)
Mom reports feeling good. No concerns about herself at this time. EPDS=4 [facility] score=4) Mom reports baby is doing well. Feeding, peeing, and pooping without difficulty. Safe sleep reviewed. Mom reports no concerns about baby at present.  Odis Hollingshead, RN 02-14-2022 at 9:47am

## 2022-02-15 ENCOUNTER — Encounter: Payer: Medicaid Other | Admitting: Family Medicine

## 2022-02-20 ENCOUNTER — Inpatient Hospital Stay (HOSPITAL_COMMUNITY): Admission: AD | Admit: 2022-02-20 | Payer: Medicaid Other | Source: Home / Self Care | Admitting: Family Medicine

## 2022-02-20 ENCOUNTER — Encounter: Payer: Medicaid Other | Admitting: Advanced Practice Midwife

## 2022-02-25 ENCOUNTER — Encounter: Payer: Self-pay | Admitting: Family Medicine

## 2022-03-01 ENCOUNTER — Ambulatory Visit (INDEPENDENT_AMBULATORY_CARE_PROVIDER_SITE_OTHER): Payer: Medicaid Other

## 2022-03-01 VITALS — BP 111/72 | HR 68

## 2022-03-01 DIAGNOSIS — G43801 Other migraine, not intractable, with status migrainosus: Secondary | ICD-10-CM

## 2022-03-01 DIAGNOSIS — G43909 Migraine, unspecified, not intractable, without status migrainosus: Secondary | ICD-10-CM

## 2022-03-01 NOTE — Progress Notes (Signed)
Chart reviewed.

## 2022-03-01 NOTE — Progress Notes (Signed)
Patient states that she has had an increase in migraine headaches. Dr. Nehemiah Settle asked that she come in for blood pressure check and blood work. Kathrene Alu RN

## 2022-03-02 LAB — CBC
Hematocrit: 40.6 % (ref 34.0–46.6)
Hemoglobin: 13.2 g/dL (ref 11.1–15.9)
MCH: 27.9 pg (ref 26.6–33.0)
MCHC: 32.5 g/dL (ref 31.5–35.7)
MCV: 86 fL (ref 79–97)
Platelets: 231 10*3/uL (ref 150–450)
RBC: 4.73 x10E6/uL (ref 3.77–5.28)
RDW: 12.1 % (ref 11.7–15.4)
WBC: 4.7 10*3/uL (ref 3.4–10.8)

## 2022-03-02 LAB — COMPREHENSIVE METABOLIC PANEL
ALT: 14 IU/L (ref 0–32)
AST: 22 IU/L (ref 0–40)
Albumin/Globulin Ratio: 1.8 (ref 1.2–2.2)
Albumin: 4.2 g/dL (ref 4.0–5.0)
Alkaline Phosphatase: 67 IU/L (ref 44–121)
BUN/Creatinine Ratio: 13 (ref 9–23)
BUN: 11 mg/dL (ref 6–20)
Bilirubin Total: 1.1 mg/dL (ref 0.0–1.2)
CO2: 21 mmol/L (ref 20–29)
Calcium: 9.2 mg/dL (ref 8.7–10.2)
Chloride: 103 mmol/L (ref 96–106)
Creatinine, Ser: 0.83 mg/dL (ref 0.57–1.00)
Globulin, Total: 2.4 g/dL (ref 1.5–4.5)
Glucose: 77 mg/dL (ref 70–99)
Potassium: 4.6 mmol/L (ref 3.5–5.2)
Sodium: 139 mmol/L (ref 134–144)
Total Protein: 6.6 g/dL (ref 6.0–8.5)
eGFR: 99 mL/min/{1.73_m2} (ref >59)

## 2022-03-02 LAB — TSH: TSH: 0.601 u[IU]/mL (ref 0.450–4.500)

## 2022-03-16 ENCOUNTER — Telehealth: Payer: Self-pay

## 2022-03-16 ENCOUNTER — Encounter: Payer: Self-pay | Admitting: Advanced Practice Midwife

## 2022-03-16 NOTE — Telephone Encounter (Signed)
-----   Message from Maurine Minister, [place] sent at 03/16/2022  8:49 AM EDT ----- Regarding: Request Call Back Would like to speak with a nurse.

## 2022-03-16 NOTE — Telephone Encounter (Signed)
Patient called requesting a Rx for steroids because her son tested positive for RSV. Per, Dr. Nehemiah Settle, patient should take Mucinex DM and she should avoid Sudafed and Mucinex D as it could reduce her milk supply. Understanding was voiced. Marquell Saenz l Mckinzy Fuller, CMA

## 2022-03-20 ENCOUNTER — Ambulatory Visit: Payer: Medicaid Other | Admitting: Advanced Practice Midwife

## 2022-03-31 IMAGING — US US MFM OB FOLLOW-UP
1 series · 14 of 28 positions shown · non-contrast
Comparison: none

[Series 1: us mfm ob follow-up · 73 acquisitions, 14 frames shown]
[im 3/73]
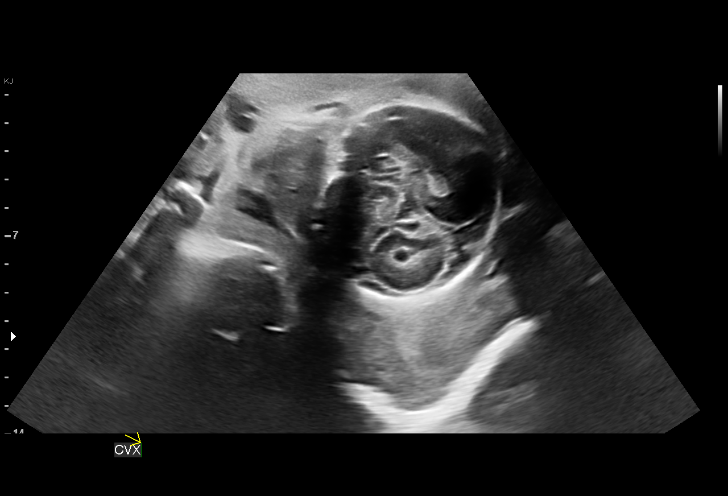
[im 9/73]
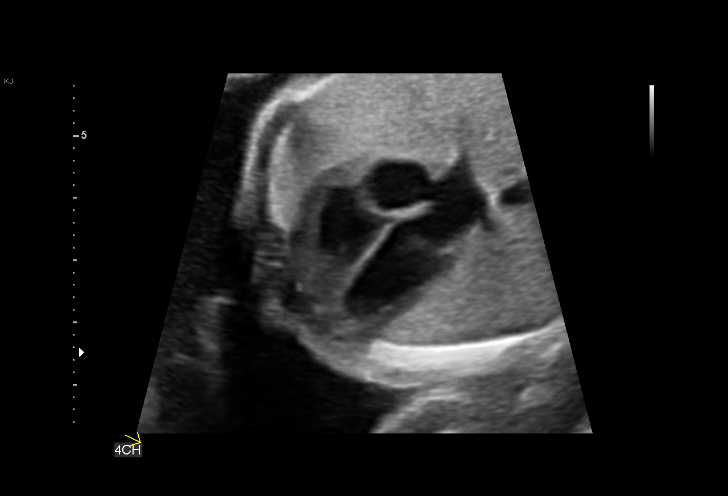
[im 14/73]
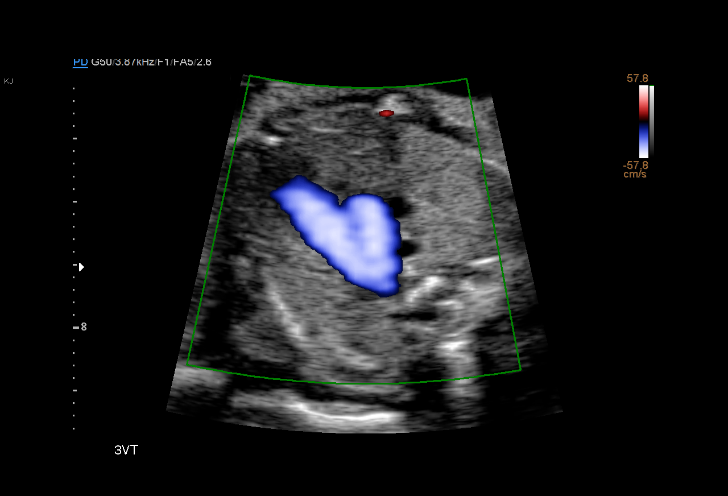
[im 19/73]
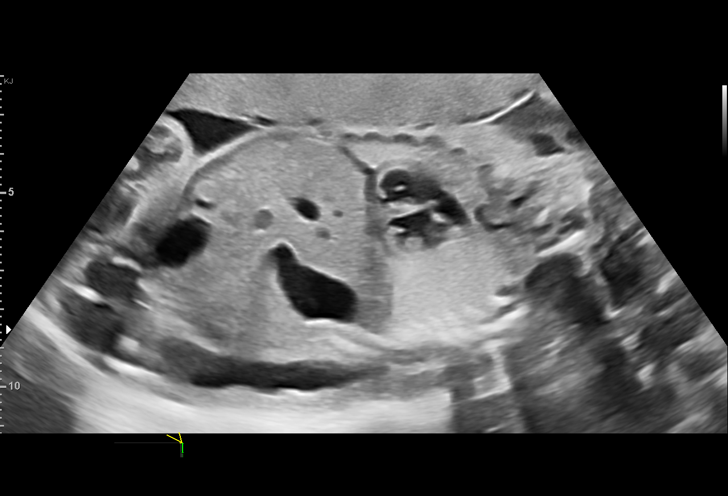
[im 25/73]
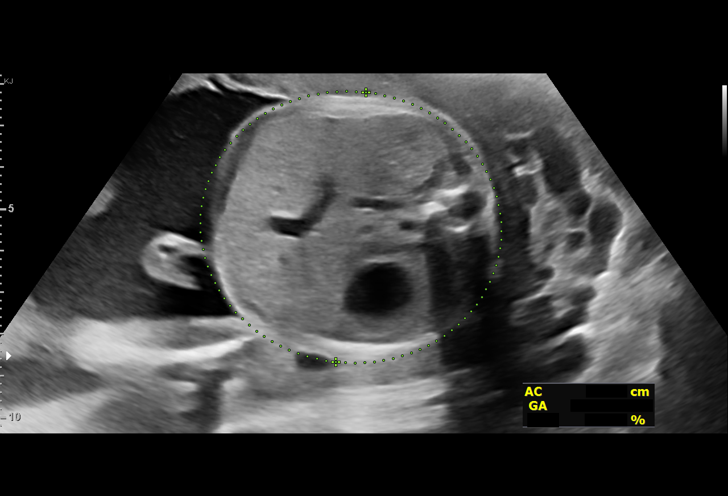
[im 30/73]
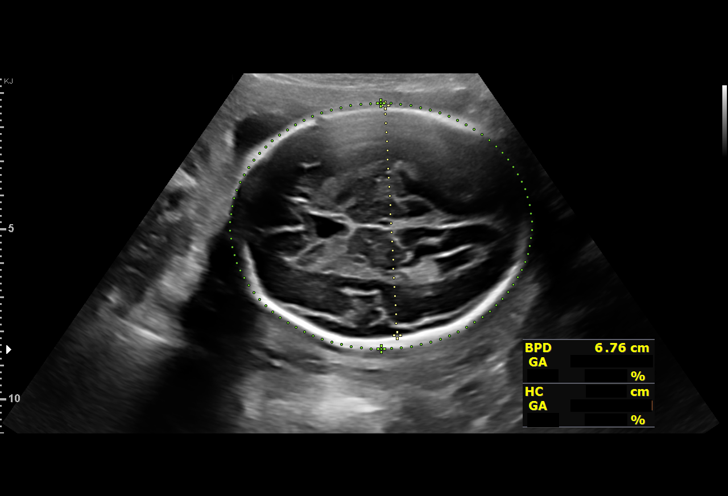
[im 35/73]
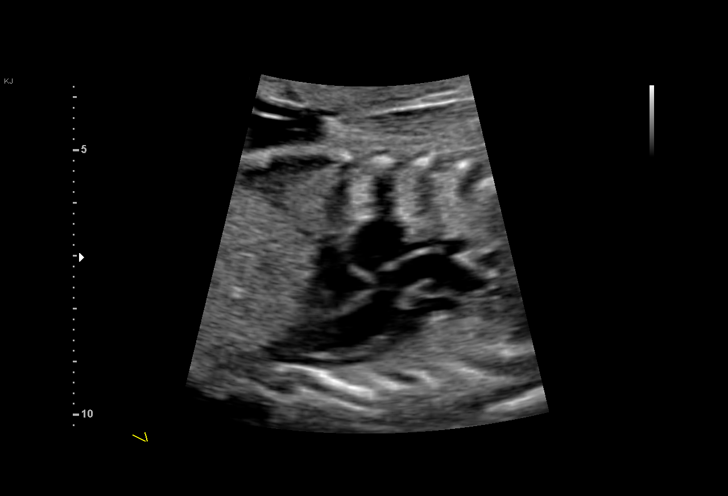
[im 41/73]
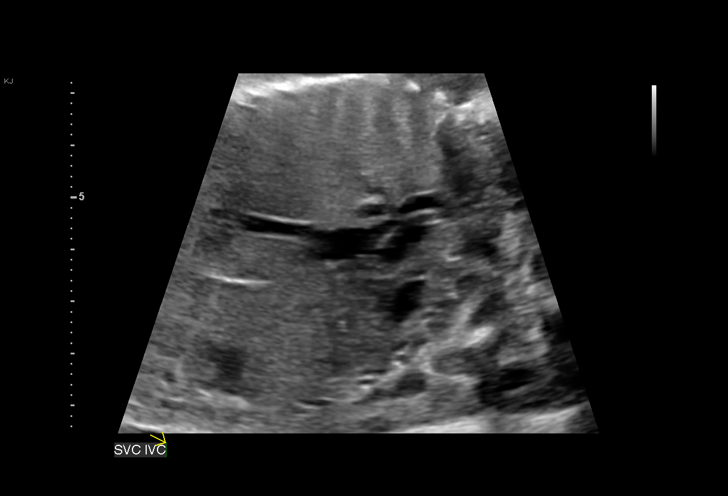
[im 46/73]
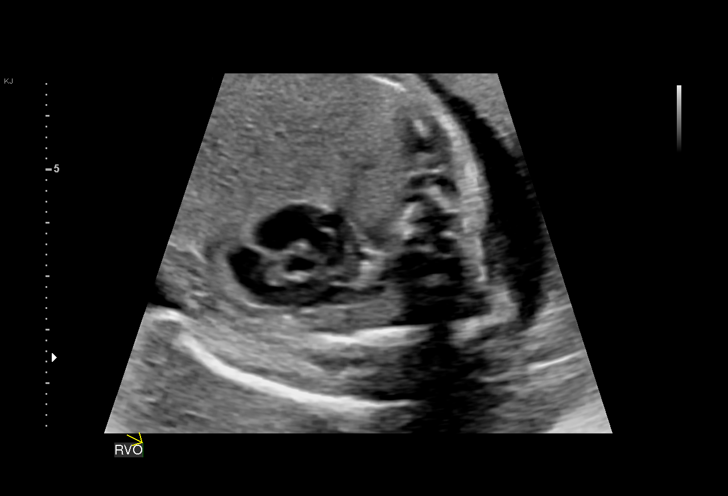
[im 51/73]
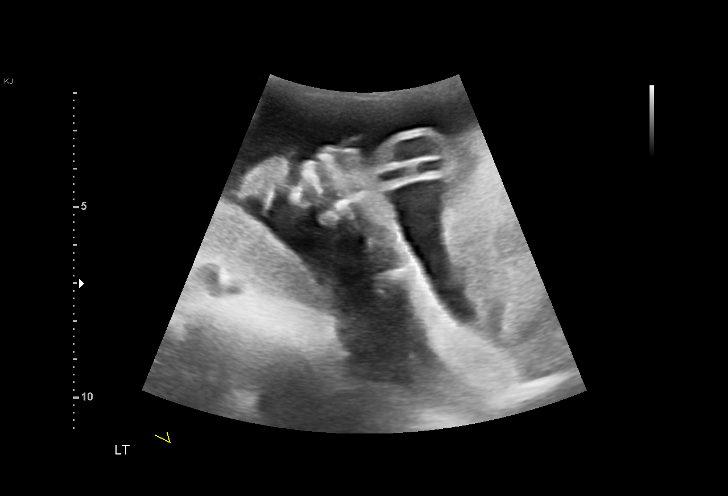
[im 57/73]
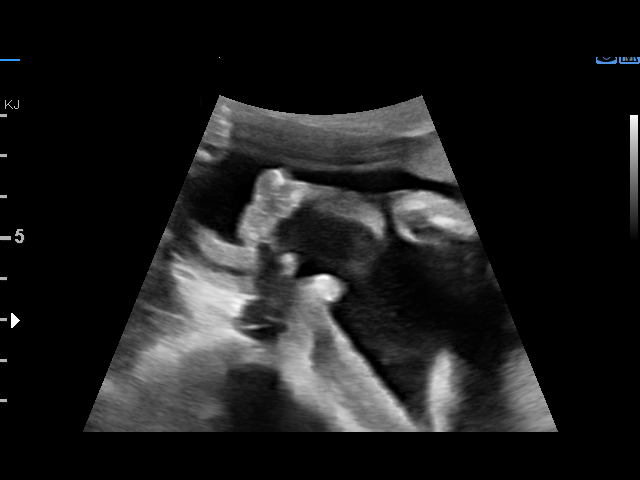
[im 62/73]
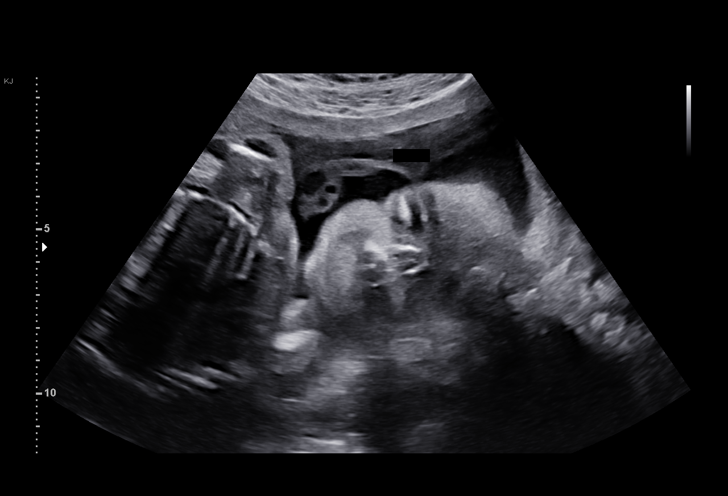
[im 67/73]
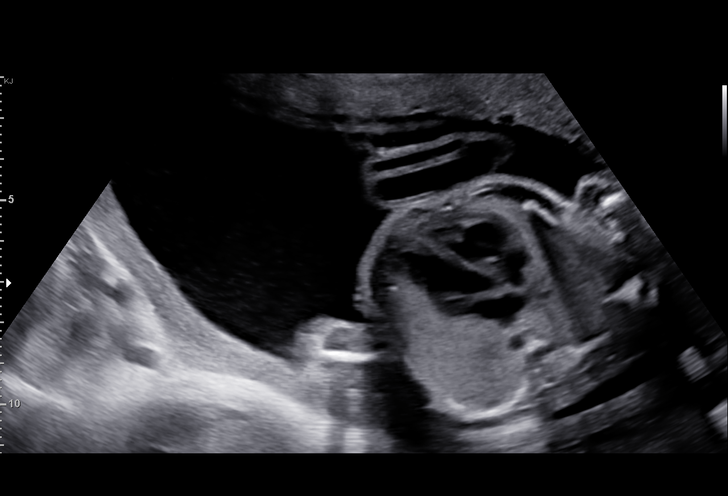
[im 73/73]
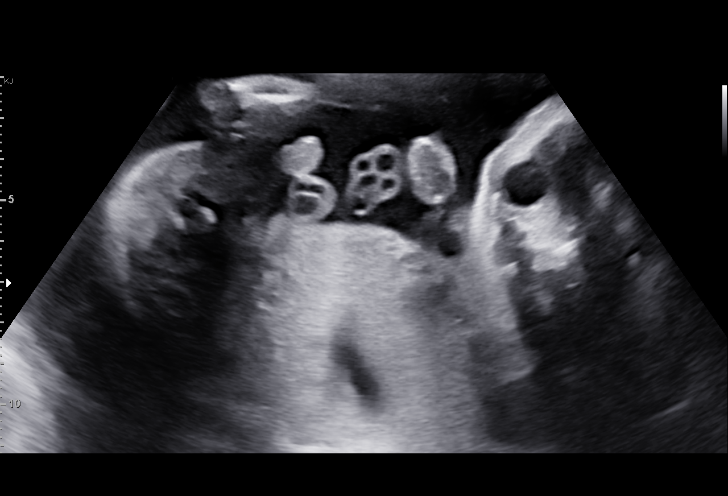

[14 of 28 positions shown; findings below may reference images not displayed]

Ref. Address:     Faculty

                                                      [facility]

Indications

 Short interval between pregnancies
 complicating pregnancy, antepartum
 Encounter for antenatal screening for
 malformations
 Negative Horizon (4 of 4)
 Rh negative state in antepartum
 26 weeks gestation of pregnancy
Fetal Evaluation

 Num Of Fetuses:         1
 Fetal Heart Rate(bpm):  138
 Cardiac Activity:       Observed
 Presentation:           Cephalic
 Placenta:               Anterior
 P. Cord Insertion:      Previously Visualized

 Amniotic Fluid
 AFI FV:      Within normal limits

                             Largest Pocket(cm)

Biometry

 BPD:        67  mm     G. Age:  27w 0d         59  %    CI:        69.89   %    70 - 86
                                                         FL/HC:      19.7   %    18.6 -
 HC:      255.7  mm     G. Age:  27w 5d         70  %    HC/AC:      1.18        1.04 -
 AC:      217.4  mm     G. Age:  26w 1d         34  %    FL/BPD:     75.1   %    71 - 87
 FL:       50.3  mm     G. Age:  27w 0d         54  %    FL/AC:      23.1   %    20 - 24
 LV:        4.2  mm

 Est. FW:     971  gm      2 lb 2 oz     50  %
OB History

 Gravidity:    3         Term:   1        Prem:   0        SAB:   0
 TOP:          1       Ectopic:  0        Living: 1
Gestational Age

 LMP:           26w 3d        Date:  12/08/19                 EDD:   09/13/20
 U/S Today:     27w 0d                                        EDD:   09/09/20
 Best:          26w 3d     Det. By:  LMP  (12/08/19)          EDD:   09/13/20
Anatomy

 Cranium:               Previously seen        Aortic Arch:            Previously seen
 Cavum:                 Previously seen        Ductal Arch:            Previously seen
 Ventricles:            Appears normal         Diaphragm:              Appears normal
 Choroid Plexus:        Previously seen        Stomach:                Appears normal, left
                                                                       sided
 Cerebellum:            Previously seen        Abdomen:                Previously seen
 Posterior Fossa:       Previously seen        Abdominal Wall:         Previously seen
 Nuchal Fold:           Previously seen        Cord Vessels:           Previously seen
 Face:                  Profile previously     Kidneys:                Appear normal
                        seen
 Lips:                  Appears normal         Bladder:                Appears normal
 Heart:                 Appears normal         Spine:                  Previously seen
                        (4CH, axis, and
                        situs)
 RVOT:                  Appears normal         Upper Extremities:      Previously seen
 LVOT:                  Appears normal         Lower Extremities:      Previously seen

 Other:  3VV/T appears normal. SVC IVC appears normal. Hands appear
         normal. Heels and 5th digit previously seen. Nasal bone previously
         seen.
Cervix Uterus Adnexa

 Cervix
 Length:           3.26  cm.
 Normal appearance by transabdominal scan.
Comments

 This patient was seen for a follow up exam as the views of
 the fetal anatomy were unable to be fully visualized during
 her last exam.  She denies any problems since her last exam.
 She was informed that the fetal growth and amniotic fluid
 level appears appropriate for her gestational age.
 The views of the fetal anatomy were visualized today.  There
 were no obvious anomalies noted.
 The limitations of ultrasound in the detection of all anomalies
 was discussed.
 Follow-up as indicated.

## 2022-06-16 ENCOUNTER — Other Ambulatory Visit: Payer: Self-pay

## 2022-06-16 ENCOUNTER — Encounter (HOSPITAL_COMMUNITY): Payer: Self-pay | Admitting: Emergency Medicine

## 2022-06-16 ENCOUNTER — Emergency Department (HOSPITAL_COMMUNITY)
Admission: EM | Admit: 2022-06-16 | Discharge: 2022-06-16 | Disposition: A | Discharge status: Home / Self Care | Payer: Medicaid Other | Attending: Emergency Medicine | Admitting: Emergency Medicine

## 2022-06-16 DIAGNOSIS — J0100 Acute maxillary sinusitis, unspecified: Secondary | ICD-10-CM | POA: Diagnosis not present

## 2022-06-16 DIAGNOSIS — Z8616 Personal history of COVID-19: Secondary | ICD-10-CM | POA: Insufficient documentation

## 2022-06-16 DIAGNOSIS — R519 Headache, unspecified: Secondary | ICD-10-CM | POA: Diagnosis present

## 2022-06-16 MED ORDER — AMOXICILLIN-POT CLAVULANATE 875-125 MG PO TABS: 1.0000 | ORAL_TABLET | Freq: Two times a day (BID) | ORAL | 0 refills | Status: AC

## 2022-06-16 MED ORDER — OXYMETAZOLINE HCL 0.05 % NA SOLN
1.0000 | Freq: Once | NASAL | Status: AC
  Administered 2022-06-16: 1 via NASAL

## 2022-06-16 MED ORDER — OXYMETAZOLINE HCL 0.05 % NA SOLN
1.0000 | Freq: Once | NASAL | Status: DC
  Filled 2022-06-16: qty 30

## 2022-06-16 NOTE — ED Triage Notes (Addendum)
Pt reports bilateral facial pain under both eyes. Pt reports she has had runny nose and cold symptoms x 3 days.

## 2022-06-16 NOTE — ED Provider Notes (Signed)
[facility] Provider Note   CSN: [id number] Arrival date & time: 06/16/22  1761     History  Chief Complaint  Patient presents with   Facial Pain    Lindsay Phillips is a 27 y.o. female with no PMH c/o left facial pain x 3 days and nasal congestion x 1 month since being diagnosed with COVID-19. Pt denies chance of pregnancy. She is currently breastfeeding. She denies fever, chills, nausea, vomiting, abdominal pain, sore throat, ear pain, chest pain, shortness of breath, or other complaints. Denies history of sinusitis. Does not currently have a PCP.      Home Medications Prior to Admission medications   Medication Sig Start Date End Date Taking? Authorizing Provider  amoxicillin-clavulanate (AUGMENTIN) 875-125 MG tablet Take 1 tablet by mouth every 12 (twelve) hours for 10 days. 06/16/22 06/26/22 Yes Cristoval Teall, Shakeda L, PA-C  acetaminophen (TYLENOL) 325 MG tablet Take 2 tablets (650 mg total) by mouth every 4 (four) hours as needed (for pain scale < 4). 02/07/22   Cashion, Colter L, MD  docusate sodium (COLACE) 100 MG capsule Take 1 capsule (100 mg total) by mouth daily. 02/07/22   Cashion, Colter L, MD  ibuprofen (ADVIL) 600 MG tablet Take 1 tablet (600 mg total) by mouth every 6 (six) hours. 02/07/22   Cashion, Colter L, MD  Prenatal Vit-Fe Fumarate-FA (PRENATAL VITAMINS) 28-0.8 MG TABS Take 1 tablet by mouth daily. 08/16/21   Truett Mainland, DO      Allergies    Patient has no known allergies.    Review of Systems   Review of Systems  Constitutional:  Negative for activity change, appetite change, chills, diaphoresis and fever.  HENT:  Positive for congestion, sinus pressure and sinus pain. Negative for ear pain, sore throat, trouble swallowing and voice change.   Eyes:  Negative for pain and visual disturbance.  Respiratory:  Negative for cough, chest tightness and shortness of breath.   Cardiovascular:  Negative for chest pain and  palpitations.  Gastrointestinal:  Negative for abdominal pain, diarrhea, nausea and vomiting.  Genitourinary:  Negative for dysuria and hematuria.  Musculoskeletal:  Negative for arthralgias and back pain.  Skin:  Negative for color change and rash.  Neurological:  Negative for seizures, syncope, facial asymmetry and headaches.  All other systems reviewed and are negative.   Physical Exam Updated Vital Signs BP 120/82 (BP Location: Right Arm)   Pulse 80   Temp 99.1 F (37.3 C) (Oral)   Resp 18   SpO2 100%  Physical Exam Vitals and nursing note reviewed.  Constitutional:      General: She is not in acute distress.    Appearance: Normal appearance. She is not ill-appearing or toxic-appearing.  HENT:     Head: Normocephalic and atraumatic.     Comments: Moderate left maxillary sinus tenderness, no overlying erythema, swelling, or skin changes    Nose: Congestion (bilateral clear) present.     Mouth/Throat:     Mouth: Mucous membranes are moist.     Pharynx: Oropharynx is clear. No oropharyngeal exudate or posterior oropharyngeal erythema.  Eyes:     Extraocular Movements: Extraocular movements intact.     Conjunctiva/sclera: Conjunctivae normal.  Cardiovascular:     Rate and Rhythm: Normal rate and regular rhythm.     Heart sounds: Normal heart sounds. No murmur heard. Pulmonary:     Effort: Pulmonary effort is normal. No respiratory distress.     Breath sounds: Normal  breath sounds. No wheezing, rhonchi or rales.  Abdominal:     General: Abdomen is flat.     Palpations: Abdomen is soft.     Tenderness: There is no abdominal tenderness.  Musculoskeletal:        General: Normal range of motion.     Cervical back: Normal range of motion and neck supple. No rigidity.     Right lower leg: No edema.     Left lower leg: No edema.  Skin:    General: Skin is warm and dry.     Capillary Refill: Capillary refill takes less than 2 seconds.  Neurological:     Mental Status: She is  alert. Mental status is at baseline.  Psychiatric:        Mood and Affect: Mood normal.        Behavior: Behavior normal.     ED Results / Procedures / Treatments   Labs (all labs ordered are listed, but only abnormal results are displayed) Labs Reviewed - No data to display  EKG None  Radiology No results found.  Procedures Procedures   Medications Ordered in ED Medications  oxymetazoline (AFRIN) 0.05 % nasal spray 1 spray (1 spray Each Nare Given 06/16/22 1301)    ED Course/ Medical Decision Making/ A&P                             Medical Decision Making Risk Prescription drug management.   This is a 27 year old female with no PMH presenting to ED c/o acute left facial pain and pressure for the last 3 days with lingering congestion for over a month following COVID-19 diagnosis. Differential including but not limited to acute sinusitis, upper respiratory infection, viral vs bacterial illness, very low suspicion for pneumonia with no fever and clear lungs to auscultation bilaterally. Suspect symptoms are viral in nature, however, with congestion for over a month in discussion with pt we will go ahead and treat for acute sinusitis with Augmentin and Afrin (provided in ED prior to discharge). Discussed case with attending DO and with evidence we have to date Augmentin and Afrin are ok to send pt home with while breastfeeding. She has no PCP so provided with names of 2 clinics she may follow up with for re-evaluation as well as management of any chronic medical conditions. Given strict return precautions and discharge instructions. All questions answered and pt stable for discharge.          Final Clinical Impression(s) / ED Diagnoses Final diagnoses:  Acute non-recurrent maxillary sinusitis    Rx / DC Orders ED Discharge Orders          Ordered    amoxicillin-clavulanate (AUGMENTIN) 875-125 MG tablet  Every 12 hours        06/16/22 1232              Katena, Petitjean, PA-C 81/19/14 7829    Lianne Cure, DO 56/21/30 [phone]

## 2022-06-16 NOTE — Discharge Instructions (Addendum)
Thank you for letting us take care of you today.  I will treat your symptoms with an antibiotic as prescribed to treat suspect sinusitis. I have provided the names of 2 primary care clinics as well. I recommend you set up a follow up appointment in the next week with one of these clinics or a PCP of your choice to establish primary care and be re-evaluated.  Please take antibiotics as directed. If you develop worsening symptoms including chest pain, shortness of breath, fever > 103 F, uncontrollable vomiting, or other concerns, please be re-evaluated at nearest emergency department.

## 2022-07-02 NOTE — Progress Notes (Unsigned)
NEUROLOGY CONSULTATION NOTE  Lindsay Phillips MRN: [id number] DOB: August 08, 1995  Referring provider: Lorin Mercy, PA-C Primary care provider: Lorin Mercy, PA-C  Reason for consult:  trigeminal neuralgia  Assessment/Plan:   Left-sided trigeminal neuralgia  Will check MRI of trigeminal nerves with and without contrast.  Due to her age and gender, would also check MRI of brain with and without contrast to rule out potential demyelinating disease. Advised to go ahead and start gabapentin 100mg  three times daily (may start with once a day and titrate up as needed).  Gabapentin okay to take in breastfeeding.   Follow up 4 months.   Subjective:  Lindsay Phillips is a 27 year old right-handed female who presents for left sided trigeminal neuralgia.  History supplemented by ED, UC and referring provider's notes.  On 1/18, she developed left sided facial pain and nasal congestion.  She was seen in the ED on 1/20 where she was prescribed Augmentin and Afrin for acute sinusitis but facial pain progressively worsened.  She describes numbing and burning pounding pain in the periorbital and left maxillary region that spread to involve down into her gums and along the jaw.  Eating, brushing teeth and light touch of the face triggers it.  About a month prior, she had COVID and was experiencing intermittent left sided headaches.  She saw the dentist who didn't find anything concerning.  The pain was persistent until she received a steroid shot on 1/22 and now it is mild facial pain/soreness.  She also has an aching and pressure in left occipital region aggravated with laughing or prolonged smiling.  No neck pain.    She gave birth to a baby girl on 9/12.  She is currently breastfeeding.   Her aunt has similar symptoms over the past year.  She is being treated by neurology.     03/01/2022 BMP with Na 139, K+ 4.6, Cl 103, CO2 21, glucose 77, BUN 11, Cr 0.83  PAST MEDICAL HISTORY: Past Medical History:   Diagnosis Date   Medical history non-contributory     PAST SURGICAL HISTORY: Past Surgical History:  Procedure Laterality Date   INDUCED ABORTION     NO PAST SURGERIES      MEDICATIONS: Current Outpatient Medications on File Prior to Visit  Medication Sig Dispense Refill   acetaminophen (TYLENOL) 325 MG tablet Take 2 tablets (650 mg total) by mouth every 4 (four) hours as needed (for pain scale < 4). 30 tablet 0   docusate sodium (COLACE) 100 MG capsule Take 1 capsule (100 mg total) by mouth daily. 10 capsule 0   ibuprofen (ADVIL) 600 MG tablet Take 1 tablet (600 mg total) by mouth every 6 (six) hours. 30 tablet 0   Prenatal Vit-Fe Fumarate-FA (PRENATAL VITAMINS) 28-0.8 MG TABS Take 1 tablet by mouth daily. 30 tablet 11   No current facility-administered medications on file prior to visit.    ALLERGIES: No Known Allergies  FAMILY HISTORY: Family History  Problem Relation Age of Onset   Healthy Mother    Healthy Father    Hypertension Maternal Grandmother    Lupus Paternal Grandmother    Asthma Neg Hx    Birth defects Neg Hx    Cancer Neg Hx    Diabetes Neg Hx    Heart disease Neg Hx    Stroke Neg Hx     Objective:  Blood pressure 116/74, pulse 69, height 5\' 6"  (1.676 m), weight 188 lb 6.4 oz (85.5 kg), SpO2 100 %, currently breastfeeding.  General: No acute distress.  Patient appears well-groomed.   Head:  Normocephalic/atraumatic Eyes:  fundi examined but not visualized Neck: supple, no paraspinal tenderness, full range of motion Back: No paraspinal tenderness Heart: regular rate and rhythm Lungs: Clear to auscultation bilaterally. Vascular: No carotid bruits. Neurological Exam: Mental status: alert and oriented to person, place, and time, speech fluent and not dysarthric, language intact. Cranial nerves: CN I: not tested CN II: pupils equal, round and reactive to light, visual fields intact CN III, IV, VI:  full range of motion, no nystagmus, no ptosis CN V:  facial sensation intact. CN VII: upper and lower face symmetric CN VIII: hearing intact CN IX, X: gag intact, uvula midline CN XI: sternocleidomastoid and trapezius muscles intact CN XII: tongue midline Bulk & Tone: normal, no fasciculations. Motor:  muscle strength 5/5 throughout Sensation:  Pinprick, temperature and vibratory sensation intact. Deep Tendon Reflexes:  2+ throughout,  toes downgoing.   Finger to nose testing:  Without dysmetria.   Heel to shin:  Without dysmetria.   Gait:  Normal station and stride.  Romberg negative.    Thank you for allowing me to take part in the care of this patient.  Metta Clines, DO  CC: Lorin Mercy, PA-C

## 2022-07-03 ENCOUNTER — Encounter: Payer: Self-pay | Admitting: Neurology

## 2022-07-03 ENCOUNTER — Ambulatory Visit: Payer: Medicaid Other | Admitting: Neurology

## 2022-07-03 VITALS — BP 116/74 | HR 69 | Ht 66.0 in | Wt 188.4 lb

## 2022-07-03 DIAGNOSIS — G500 Trigeminal neuralgia: Secondary | ICD-10-CM

## 2022-07-03 NOTE — Patient Instructions (Signed)
Will check MRI of brain and trigeminal nerves with and without contrast Start gabapentin 100mg  three times daily.  If it is too strong, take just once at bedtime for a week.  If not improved, then increase to twice daily.  If not improved after a week, then increase to three times daily.  Contact me for refills or change in dosing.   Follow up 4 months.

## 2022-07-05 ENCOUNTER — Encounter: Payer: Self-pay | Admitting: Neurology

## 2022-07-05 ENCOUNTER — Ambulatory Visit: Payer: Medicaid Other | Admitting: Nurse Practitioner

## 2022-07-21 ENCOUNTER — Other Ambulatory Visit: Payer: Self-pay | Admitting: Neurology

## 2022-07-21 ENCOUNTER — Ambulatory Visit
Admission: RE | Admit: 2022-07-21 | Discharge: 2022-07-21 | Disposition: A | Discharge status: Home / Self Care | Payer: Medicaid Other | Source: Ambulatory Visit | Attending: Neurology | Admitting: Neurology

## 2022-07-21 DIAGNOSIS — G500 Trigeminal neuralgia: Secondary | ICD-10-CM

## 2022-07-24 ENCOUNTER — Telehealth: Payer: Self-pay | Admitting: Neurology

## 2022-07-24 NOTE — Telephone Encounter (Signed)
Patient left message wanting her test results please call patient

## 2022-07-24 NOTE — Telephone Encounter (Signed)
Pateint advised of MRI results, voiced understanding and thanked me for calling

## 2022-09-04 ENCOUNTER — Encounter: Payer: Self-pay | Admitting: Family Medicine

## 2022-09-24 ENCOUNTER — Encounter: Payer: Self-pay | Admitting: Family Medicine

## 2022-10-09 ENCOUNTER — Encounter: Payer: Self-pay | Admitting: Obstetrics and Gynecology

## 2022-10-09 ENCOUNTER — Other Ambulatory Visit (HOSPITAL_COMMUNITY)
Admission: RE | Admit: 2022-10-09 | Discharge: 2022-10-09 | Disposition: A | Discharge status: Home / Self Care | Payer: Medicaid Other | Source: Ambulatory Visit | Attending: Obstetrics and Gynecology | Admitting: Obstetrics and Gynecology

## 2022-10-09 ENCOUNTER — Ambulatory Visit (INDEPENDENT_AMBULATORY_CARE_PROVIDER_SITE_OTHER): Payer: Medicaid Other | Admitting: Obstetrics and Gynecology

## 2022-10-09 VITALS — BP 104/71 | HR 62 | Ht 66.0 in

## 2022-10-09 DIAGNOSIS — Z01419 Encounter for gynecological examination (general) (routine) without abnormal findings: Secondary | ICD-10-CM

## 2022-10-09 DIAGNOSIS — Z124 Encounter for screening for malignant neoplasm of cervix: Secondary | ICD-10-CM

## 2022-10-09 DIAGNOSIS — R102 Pelvic and perineal pain: Secondary | ICD-10-CM | POA: Insufficient documentation

## 2022-10-09 DIAGNOSIS — Z1339 Encounter for screening examination for other mental health and behavioral disorders: Secondary | ICD-10-CM | POA: Diagnosis not present

## 2022-10-09 NOTE — Addendum Note (Signed)
Addended by: Sue Lush on: 10/09/2022 05:27 PM   Modules accepted: Level of Service

## 2022-10-09 NOTE — Progress Notes (Signed)
ANNUAL EXAM Patient name: Lindsay Phillips MRN [id number]  Date of birth: 03-Mar-1996 Chief Complaint:   Gynecologic Exam  History of Present Illness:   Lindsay Phillips is a 27 y.o. [phone] African-American female being seen today for a routine annual exam.  Current complaints: reports some irregular bleeding after intercourse and suprapubic pain. Reports the pain can be after intercourse or the day after. She believes it started after her period starting back after delivery. Unsure if period started it has been irregular Denies vaginal d/c or odor, urinary freq/urgency. Reports she is lubricated for intercourse. Denies exposure STD  The pregnancy intention screening data noted above was reviewed. Potential methods of contraception were discussed. The patient elected to proceed with No data recorded.   Last pap 11/20/19. Results were: NILM w/ HRHPV not done. H/O abnormal pap: no Last mammogram: n/a. Results were: N/A. Family h/o breast cancer: no Last colonoscopy: n/a. Results were: N/A.      10/09/2022    9:38 AM 07/11/2021    9:22 AM  Depression screen PHQ 2/9  Decreased Interest 0 0  Down, Depressed, Hopeless 1 0  PHQ - 2 Score 1 0  Altered sleeping 0 0  Tired, decreased energy 1 1  Change in appetite 0 1  Feeling bad or failure about yourself  0 0  Trouble concentrating 0 0  Moving slowly or fidgety/restless 0 0  Suicidal thoughts 0 0  PHQ-9 Score 2 2        10/09/2022    9:38 AM 07/11/2021    9:22 AM  GAD 7 : Generalized Anxiety Score  Nervous, Anxious, on Edge 1 2  Control/stop worrying 1 0  Worry too much - different things 1 1  Trouble relaxing 0 0  Restless 0 0  Easily annoyed or irritable 2 0  Afraid - awful might happen 0 0  Total GAD 7 Score 5 3     Review of Systems:   Pertinent items are noted in HPI Denies any headaches, blurred vision, fatigue, shortness of breath, chest pain, abdominal pain, abnormal vaginal discharge/itching/odor/irritation, problems with  periods, bowel movements, urination, or intercourse unless otherwise stated above. Pertinent History Reviewed:  Reviewed past medical,surgical, social and family history.  Reviewed problem list, medications and allergies. Physical Assessment:   Vitals:   10/09/22 0933  BP: 104/71  Pulse: 62  Height: 5\' 6"  (1.676 m)  Body mass index is 30.41 kg/m.        Physical Examination:   General appearance - well appearing, and in no distress  Mental status - alert, oriented to person, place, and time  Psych:  She has a normal mood and affect  Skin - warm and dry, normal color, no suspicious lesions noted  Chest - effort normal, all lung fields clear to auscultation bilaterally  Heart - normal rate and regular rhythm  Neck:  midline trachea, no thyromegaly or nodules  Breasts - breasts appear normal, no suspicious masses, no skin or nipple changes or  axillary nodes  Abdomen - soft, nontender, nondistended, no masses or organomegaly  Pelvic - mild suprapubic tenderness on palpation. VULVA: normal appearing vulva with no masses, tenderness or lesions  VAGINA: normal appearing vagina with normal color and discharge, no lesions  CERVIX: normal appearing cervix without discharge or lesions, no CMT  Thin prep pap is done   UTERUS: uterus is felt to be normal size, shape, consistency and nontender   ADNEXA: No adnexal masses or tenderness noted.  Extremities:  No swelling or varicosities noted  Chaperone present for exam   Assessment & Plan:  1. Well woman exam  - Cytology - PAP( [place]) - Cervicovaginal ancillary only( [place])  2. Cervical cancer screening  - Cytology - PAP( [place])  3. Suprapubic pain Discussed abnormal bleeding when period starts back after deliver vs, infection. I dicussed with patient normal appearance of cervix and vagina on exam, will wait for swabs and follow up in one month to reassess  - Cervicovaginal ancillary only( [place])   Mammogram:  @ 28yo, or sooner if problems Colonoscopy: @ 28yo, or sooner if problems   Follow-up: Return in about 1 month (around 11/09/2022) for follow up OB VISIT (MD or APP).  Albertine Grates, FNP

## 2022-10-10 LAB — CERVICOVAGINAL ANCILLARY ONLY
Bacterial Vaginitis (gardnerella): NEGATIVE
Candida Glabrata: NEGATIVE
Candida Vaginitis: NEGATIVE
Comment: NEGATIVE
Comment: NEGATIVE
Comment: NEGATIVE

## 2022-10-10 LAB — CYTOLOGY - PAP
Adequacy: ABSENT
Diagnosis: NEGATIVE

## 2022-10-16 ENCOUNTER — Ambulatory Visit: Payer: Medicaid Other | Admitting: Nurse Practitioner

## 2022-10-16 ENCOUNTER — Encounter: Payer: Self-pay | Admitting: Nurse Practitioner

## 2022-10-16 VITALS — BP 100/60 | HR 69 | Temp 98.1°F | Ht 66.0 in | Wt 186.6 lb

## 2022-10-16 DIAGNOSIS — Z0000 Encounter for general adult medical examination without abnormal findings: Secondary | ICD-10-CM

## 2022-10-16 DIAGNOSIS — R109 Unspecified abdominal pain: Secondary | ICD-10-CM | POA: Diagnosis not present

## 2022-10-16 DIAGNOSIS — E663 Overweight: Secondary | ICD-10-CM

## 2022-10-16 DIAGNOSIS — Z13228 Encounter for screening for other metabolic disorders: Secondary | ICD-10-CM | POA: Diagnosis not present

## 2022-10-16 DIAGNOSIS — N912 Amenorrhea, unspecified: Secondary | ICD-10-CM | POA: Diagnosis not present

## 2022-10-16 DIAGNOSIS — Z13220 Encounter for screening for lipoid disorders: Secondary | ICD-10-CM

## 2022-10-16 DIAGNOSIS — Z6830 Body mass index (BMI) 30.0-30.9, adult: Secondary | ICD-10-CM

## 2022-10-16 DIAGNOSIS — Z7689 Persons encountering health services in other specified circumstances: Secondary | ICD-10-CM

## 2022-10-16 NOTE — Progress Notes (Addendum)
Hershal Coria Martin,acting as a Neurosurgeon for Arnette Felts, FNP.,have documented all relevant documentation on the behalf of Arnette Felts, FNP,as directed by  Arnette Felts, FNP while in the presence of Arnette Felts, FNP.    Subjective:     Patient ID: Lindsay Phillips , female    DOB: 03-31-1996 , 27 y.o.   MRN: [id number]   Chief Complaint  Patient presents with   Establish Care    HPI  Patient presents today to establish care, she was going to [facility] a little over one year ago. Works as a Human resources officer. Married with 3 children - son (2 yo), 43 y/o (son) and 8 month (daughter).   PMH - she had trigeminal neuralgia but did not take any of the gabapentin. She notices when is stressed. She was given the medication by neurology but does not want to take regularly. Her aunt also takes the medication and has side effects are not good.   Patient reports she compliance with medications. Patient has no other concerns today. Patient reports she has an 82 month old but has only had 1 period since giving birth. Patient is established with a GYN. She has been experiencing cramping after intercourse, LMP - 09/08/22. She is unsure if it is her menstrual cycle. Her husband had a vasectomy in November 2023.  Random cramping the next day after intercourse.   She has just got back into running right now 3 times a week goal of 5 times a week. Diet - avoids pork.   BP Readings from Last 3 Encounters: 10/16/22 : 100/60 10/09/22 : 104/71 07/03/22 : 116/74       Past Medical History:  Diagnosis Date   Medical history non-contributory    Trigeminal neuralgia      Family History  Problem Relation Age of Onset   Healthy Mother    Healthy Father    Hypertension Maternal Grandmother    Lupus Paternal Grandmother    Asthma Neg Hx    Birth defects Neg Hx    Cancer Neg Hx    Diabetes Neg Hx    Heart disease Neg Hx    Stroke Neg Hx      Current Outpatient Medications:    gabapentin  (NEURONTIN) 100 MG capsule, Take 100 mg by mouth 3 (three) times daily. (Patient not taking: Reported on 10/09/2022), Disp: , Rfl:    ibuprofen (ADVIL) 600 MG tablet, Take 1 tablet (600 mg total) by mouth every 6 (six) hours. (Patient not taking: Reported on 10/09/2022), Disp: 30 tablet, Rfl: 0   Prenatal Vit-Fe Fumarate-FA (PRENATAL VITAMINS) 28-0.8 MG TABS, Take 1 tablet by mouth daily. (Patient not taking: Reported on 10/09/2022), Disp: 30 tablet, Rfl: 11   No Known Allergies   Review of Systems  Constitutional: Negative.   HENT: Negative.    Eyes: Negative.   Respiratory: Negative.    Cardiovascular: Negative.   Gastrointestinal: Negative.   Endocrine: Negative.   Genitourinary: Negative.   Musculoskeletal: Negative.   Skin: Negative.   Allergic/Immunologic: Negative.   Neurological: Negative.   Hematological: Negative.   Psychiatric/Behavioral: Negative.       Today's Vitals   10/16/22 1126  BP: 100/60  Pulse: 69  Temp: 98.1 F (36.7 C)  TempSrc: Oral  Weight: 186 lb 9.6 oz (84.6 kg)  Height: 5\' 6"  (1.676 m)  PainSc: 0-No pain   Body mass index is 30.12 kg/m.  Wt Readings from Last 3 Encounters:  10/16/22 186 lb 9.6 oz (84.6  kg)  07/03/22 188 lb 6.4 oz (85.5 kg)  02/05/22 195 lb 1.6 oz (88.5 kg)    Objective:  Physical Exam Constitutional:      General: She is not in acute distress.    Appearance: Normal appearance. She is well-developed. She is obese.  HENT:     Head: Normocephalic and atraumatic.     Right Ear: Hearing, tympanic membrane, ear canal and external ear normal. There is no impacted cerumen.     Left Ear: Hearing, tympanic membrane, ear canal and external ear normal. There is no impacted cerumen.     Nose: Nose normal.     Mouth/Throat:     Mouth: Mucous membranes are moist.  Eyes:     General: Lids are normal.     Extraocular Movements: Extraocular movements intact.     Conjunctiva/sclera: Conjunctivae normal.     Pupils: Pupils are equal,  round, and reactive to light.     Funduscopic exam:    Right eye: No papilledema.        Left eye: No papilledema.  Neck:     Thyroid: No thyroid mass.     Vascular: No carotid bruit.  Cardiovascular:     Rate and Rhythm: Normal rate and regular rhythm.     Pulses: Normal pulses.     Heart sounds: Normal heart sounds. No murmur heard. Pulmonary:     Effort: Pulmonary effort is normal. No respiratory distress.     Breath sounds: Normal breath sounds. No wheezing.  Chest:     Chest wall: No mass.  Breasts:    Tanner Score is 5.     Right: Normal. No mass or tenderness.     Left: Normal. No mass or tenderness.  Abdominal:     General: Abdomen is flat. Bowel sounds are normal. There is no distension.     Palpations: Abdomen is soft.     Tenderness: There is no abdominal tenderness.  Genitourinary:    Rectum: Guaiac result negative.  Musculoskeletal:        General: No swelling. Normal range of motion.     Cervical back: Full passive range of motion without pain, normal range of motion and neck supple.     Right lower leg: No edema.     Left lower leg: No edema.  Lymphadenopathy:     Upper Body:     Right upper body: No supraclavicular, axillary or pectoral adenopathy.     Left upper body: No supraclavicular, axillary or pectoral adenopathy.  Skin:    General: Skin is warm and dry.     Capillary Refill: Capillary refill takes less than 2 seconds.  Neurological:     General: No focal deficit present.     Mental Status: She is alert and oriented to person, place, and time.     Cranial Nerves: No cranial nerve deficit.     Sensory: No sensory deficit.  Psychiatric:        Mood and Affect: Mood normal.        Behavior: Behavior normal.        Thought Content: Thought content normal.        Judgment: Judgment normal.         Assessment And Plan:     1. Encounter for annual health examination Behavior modifications discussed and diet history reviewed.   Pt will continue  to exercise regularly and modify diet with low GI, plant based foods and decrease intake of processed foods.  Recommend  intake of daily multivitamin, Vitamin D, and calcium.  Recommend self breast exams for preventive screenings, as well as recommend immunizations that include influenza, TDAP  2. Amenorrhea Comments: Likely related to Breastfeeding, will check to make sure not pregnant although her husband has had a vasectomy. - POCT Urine Pregnancy - CBC with Differential/Platelet - CMP14+EGFR  3. Abdominal cramping - CMP14+EGFR  4. Establishing care with new doctor, encounter for Patient is here to establish care. Went over patient medical, family, social and surgical history. Reviewed with patient their medications and any allergies  Reviewed with patient their sexual orientation, drug/tobacco and alcohol use Dicussed any new concerns with patient   Educated patient about the importance of annual screenings and immunizations.  Advised patient to eat a healthy diet along with exercise for atleast 30-45 min atleast 4-5 days of the week.   5. Encounter for screening for metabolic disorder - TSH - Hemoglobin A1c  6. Encounter for screening for lipid disorder - Lipid panel   7. BMI 30.0-30.9,adult She is encouraged to strive for BMI less than 30 to decrease cardiac risk. Advised to aim for at least 150 minutes of exercise per week.   Return for 1 year physical.   Patient was given opportunity to ask questions. Patient verbalized understanding of the plan and was able to repeat key elements of the plan. All questions were answered to their satisfaction.  Arnette Felts, FNP   I, Arnette Felts, FNP, have reviewed all documentation for this visit. The documentation on 10/16/22 for the exam, diagnosis, procedures, and orders are all accurate and complete.   IF YOU HAVE BEEN REFERRED TO A SPECIALIST, IT MAY TAKE 1-2 WEEKS TO SCHEDULE/PROCESS THE REFERRAL. IF YOU HAVE NOT HEARD FROM  US/SPECIALIST IN TWO WEEKS, PLEASE GIVE [place] A CALL AT [phone] X [phone].   THE PATIENT IS ENCOURAGED TO PRACTICE SOCIAL DISTANCING DUE TO THE COVID-19 PANDEMIC.

## 2022-10-16 NOTE — Patient Instructions (Signed)

## 2022-10-17 LAB — CBC WITH DIFFERENTIAL/PLATELET
Basophils Absolute: 0 10*3/uL (ref 0.0–0.2)
Basos: 1 %
EOS (ABSOLUTE): 0.1 10*3/uL (ref 0.0–0.4)
Eos: 1 %
Hematocrit: 39.5 % (ref 34.0–46.6)
Hemoglobin: 12.3 g/dL (ref 11.1–15.9)
Immature Grans (Abs): 0 10*3/uL (ref 0.0–0.1)
Immature Granulocytes: 0 %
Lymphocytes Absolute: 2.9 10*3/uL (ref 0.7–3.1)
Lymphs: 43 %
MCH: 27.3 pg (ref 26.6–33.0)
MCHC: 31.1 g/dL — ABNORMAL LOW (ref 31.5–35.7)
MCV: 88 fL (ref 79–97)
Monocytes Absolute: 0.5 10*3/uL (ref 0.1–0.9)
Monocytes: 8 %
Neutrophils Absolute: 3.3 10*3/uL (ref 1.4–7.0)
Neutrophils: 47 %
Platelets: 234 10*3/uL (ref 150–450)
RBC: 4.5 x10E6/uL (ref 3.77–5.28)
RDW: 12 % (ref 11.7–15.4)
WBC: 6.9 10*3/uL (ref 3.4–10.8)

## 2022-10-17 LAB — HEMOGLOBIN A1C
Est. average glucose Bld gHb Est-mCnc: 108 mg/dL
Hgb A1c MFr Bld: 5.4 % (ref 4.8–5.6)

## 2022-10-17 LAB — CMP14+EGFR
ALT: 14 IU/L (ref 0–32)
AST: 19 IU/L (ref 0–40)
Albumin/Globulin Ratio: 2.1 (ref 1.2–2.2)
Albumin: 4.7 g/dL (ref 4.0–5.0)
Alkaline Phosphatase: 77 IU/L (ref 44–121)
BUN/Creatinine Ratio: 17 (ref 9–23)
BUN: 14 mg/dL (ref 6–20)
Bilirubin Total: 0.9 mg/dL (ref 0.0–1.2)
CO2: 24 mmol/L (ref 20–29)
Calcium: 9.7 mg/dL (ref 8.7–10.2)
Chloride: 101 mmol/L (ref 96–106)
Creatinine, Ser: 0.83 mg/dL (ref 0.57–1.00)
Globulin, Total: 2.2 g/dL (ref 1.5–4.5)
Glucose: 83 mg/dL (ref 70–99)
Potassium: 4.1 mmol/L (ref 3.5–5.2)
Sodium: 140 mmol/L (ref 134–144)
Total Protein: 6.9 g/dL (ref 6.0–8.5)
eGFR: 99 mL/min/{1.73_m2} (ref >59)

## 2022-10-17 LAB — LIPID PANEL
Chol/HDL Ratio: 3.1 ratio (ref 0.0–4.4)
Cholesterol, Total: 148 mg/dL (ref 100–199)
HDL: 48 mg/dL (ref >39)
LDL Chol Calc (NIH): 72 mg/dL (ref 0–99)
Triglycerides: 167 mg/dL — ABNORMAL HIGH (ref 0–149)
VLDL Cholesterol Cal: 28 mg/dL (ref 5–40)

## 2022-10-17 LAB — TSH: TSH: 1.1 u[IU]/mL (ref 0.450–4.500)

## 2022-10-28 LAB — POCT URINE PREGNANCY: Preg Test, Ur: NEGATIVE

## 2022-10-30 NOTE — Progress Notes (Unsigned)
   NEUROLOGY FOLLOW UP OFFICE NOTE  Lindsay Phillips [id number]  Assessment/Plan:   Left-sided trigeminal neuralgia   Stable for now, so will continue to monitor off medication If she has a recurrent flare up, will start gabapentin 100mg  at bedtime Follow up 6 months.     Subjective:  Lindsay Phillips is a 27 year old right-handed female who follows up for left sided trigeminal neuralgia.    UPDATE: MRI of brain and trigeminal nerves without contrast on 07/21/2022 personally reviewed was unremarkable.  She is still breastfeeding.  She never started the gabapentin because pain improved.  She may have a mild twinge every now and then, particularly under stress or with alcohol.        HISTORY: On 06/14/2022, she developed left sided facial pain and nasal congestion.  She was seen in the ED on 1/20 where she was prescribed Augmentin and Afrin for acute sinusitis but facial pain progressively worsened.  She describes numbing and burning pounding pain in the periorbital and left maxillary region that spread to involve down into her gums and along the jaw.  Eating, brushing teeth and light touch of the face triggers it.  About a month prior, she had COVID and was experiencing intermittent left sided headaches.  She saw the dentist who didn't find anything concerning.  The pain was persistent until she received a steroid shot on 1/22 and now it is mild facial pain/soreness.  She also has an aching and pressure in left occipital region aggravated with laughing or prolonged smiling.  No neck pain.       Her aunt has similar symptoms over the past year.  She is being treated by neurology.    PAST MEDICAL HISTORY: Past Medical History:  Diagnosis Date   Medical history non-contributory    Trigeminal neuralgia     MEDICATIONS: Current Outpatient Medications on File Prior to Visit  Medication Sig Dispense Refill   gabapentin (NEURONTIN) 100 MG capsule Take 100 mg by mouth 3 (three) times daily.  (Patient not taking: Reported on 10/09/2022)     ibuprofen (ADVIL) 600 MG tablet Take 1 tablet (600 mg total) by mouth every 6 (six) hours. (Patient not taking: Reported on 10/09/2022) 30 tablet 0   Prenatal Vit-Fe Fumarate-FA (PRENATAL VITAMINS) 28-0.8 MG TABS Take 1 tablet by mouth daily. (Patient not taking: Reported on 10/09/2022) 30 tablet 11   No current facility-administered medications on file prior to visit.    ALLERGIES: No Known Allergies  FAMILY HISTORY: Family History  Problem Relation Age of Onset   Healthy Mother    Healthy Father    Hypertension Maternal Grandmother    Lupus Paternal Grandmother    Asthma Neg Hx    Birth defects Neg Hx    Cancer Neg Hx    Diabetes Neg Hx    Heart disease Neg Hx    Stroke Neg Hx       Objective:  Blood pressure 111/71, pulse 77, height 5\' 6"  (1.676 m), weight 183 lb 6.4 oz (83.2 kg), last menstrual period 09/13/2022, SpO2 99 %, currently breastfeeding.  General: No acute distress.  Patient appears well-groomed.     Shon Millet, DO  CC: Arnette Felts, FNP

## 2022-10-31 ENCOUNTER — Other Ambulatory Visit (HOSPITAL_COMMUNITY)
Admission: RE | Admit: 2022-10-31 | Discharge: 2022-10-31 | Disposition: A | Discharge status: Home / Self Care | Payer: Medicaid Other | Source: Ambulatory Visit | Attending: Family Medicine | Admitting: Family Medicine

## 2022-10-31 ENCOUNTER — Ambulatory Visit (INDEPENDENT_AMBULATORY_CARE_PROVIDER_SITE_OTHER): Payer: Medicaid Other

## 2022-10-31 VITALS — BP 116/77 | HR 79 | Wt 183.0 lb

## 2022-10-31 DIAGNOSIS — R300 Dysuria: Secondary | ICD-10-CM | POA: Diagnosis not present

## 2022-10-31 DIAGNOSIS — N898 Other specified noninflammatory disorders of vagina: Secondary | ICD-10-CM | POA: Diagnosis present

## 2022-10-31 LAB — POCT URINALYSIS DIPSTICK
Bilirubin, UA: NEGATIVE
Blood, UA: NEGATIVE
Glucose, UA: NEGATIVE
Ketones, UA: NEGATIVE
Leukocytes, UA: NEGATIVE
Nitrite, UA: NEGATIVE
Protein, UA: NEGATIVE
Spec Grav, UA: 1.015 (ref 1.010–1.025)
Urobilinogen, UA: 0.2 E.U./dL
pH, UA: 7.5 (ref 5.0–8.0)

## 2022-10-31 NOTE — Progress Notes (Signed)
SUBJECTIVE:  27 y.o. female complains of green vaginal discharge for 1 week(s). Denies abnormal vaginal bleeding or significant pelvic pain or fever. No UTI symptoms. Denies history of known exposure to STD.  Patient's last menstrual period was 09/13/2022.  OBJECTIVE:  She appears well, afebrile. Urine dipstick: negative for all components.  ASSESSMENT:  Vaginal Discharge  Vaginal Odor   PLAN:  GC, chlamydia, trichomonas, BVAG, CVAG probe sent to lab. Treatment: To be determined once lab results are received ROV prn if symptoms persist or worsen.

## 2022-11-01 ENCOUNTER — Encounter: Payer: Self-pay | Admitting: Neurology

## 2022-11-01 ENCOUNTER — Encounter: Payer: Self-pay | Admitting: Family Medicine

## 2022-11-01 ENCOUNTER — Ambulatory Visit: Payer: Medicaid Other | Admitting: Neurology

## 2022-11-01 VITALS — BP 111/71 | HR 77 | Ht 66.0 in | Wt 183.4 lb

## 2022-11-01 DIAGNOSIS — G500 Trigeminal neuralgia: Secondary | ICD-10-CM | POA: Diagnosis not present

## 2022-11-01 LAB — CERVICOVAGINAL ANCILLARY ONLY
Bacterial Vaginitis (gardnerella): NEGATIVE
Candida Glabrata: NEGATIVE
Candida Vaginitis: NEGATIVE
Chlamydia: NEGATIVE
Comment: NEGATIVE
Comment: NEGATIVE
Comment: NEGATIVE
Comment: NEGATIVE
Comment: NEGATIVE
Comment: NORMAL
Neisseria Gonorrhea: NEGATIVE
Trichomonas: NEGATIVE

## 2022-11-07 ENCOUNTER — Ambulatory Visit: Payer: Medicaid Other | Admitting: Family Medicine

## 2023-03-02 ENCOUNTER — Encounter: Payer: Self-pay | Admitting: Family Medicine

## 2023-03-04 MED ORDER — AMOXICILLIN 875 MG PO TABS: 875.0000 mg | ORAL_TABLET | Freq: Two times a day (BID) | ORAL | 0 refills | Status: AC

## 2023-05-02 ENCOUNTER — Emergency Department (HOSPITAL_BASED_OUTPATIENT_CLINIC_OR_DEPARTMENT_OTHER)
Admission: EM | Admit: 2023-05-02 | Discharge: 2023-05-02 | Disposition: A | Discharge status: Home / Self Care | Payer: BC Managed Care – PPO | Attending: Emergency Medicine | Admitting: Emergency Medicine

## 2023-05-02 ENCOUNTER — Emergency Department (HOSPITAL_BASED_OUTPATIENT_CLINIC_OR_DEPARTMENT_OTHER): Payer: BC Managed Care – PPO

## 2023-05-02 ENCOUNTER — Other Ambulatory Visit: Payer: Self-pay

## 2023-05-02 ENCOUNTER — Encounter (HOSPITAL_BASED_OUTPATIENT_CLINIC_OR_DEPARTMENT_OTHER): Payer: Self-pay

## 2023-05-02 DIAGNOSIS — R1032 Left lower quadrant pain: Secondary | ICD-10-CM | POA: Insufficient documentation

## 2023-05-02 LAB — COMPREHENSIVE METABOLIC PANEL
ALT: 11 U/L (ref 0–44)
AST: 11 U/L — ABNORMAL LOW (ref 15–41)
Albumin: 4.6 g/dL (ref 3.5–5.0)
Alkaline Phosphatase: 45 U/L (ref 38–126)
Anion gap: 9 (ref 5–15)
BUN: 18 mg/dL (ref 6–20)
CO2: 28 mmol/L (ref 22–32)
Calcium: 9.6 mg/dL (ref 8.9–10.3)
Chloride: 102 mmol/L (ref 98–111)
Creatinine, Ser: 0.74 mg/dL (ref 0.44–1.00)
GFR, Estimated: >60 mL/min (ref >60)
Glucose, Bld: 120 mg/dL — ABNORMAL HIGH (ref 70–99)
Potassium: 3.8 mmol/L (ref 3.5–5.1)
Sodium: 139 mmol/L (ref 135–145)
Total Bilirubin: 1.4 mg/dL — ABNORMAL HIGH (ref <1.2)
Total Protein: 7.2 g/dL (ref 6.5–8.1)

## 2023-05-02 LAB — CBC WITH DIFFERENTIAL/PLATELET
Abs Immature Granulocytes: 0.04 10*3/uL (ref 0.00–0.07)
Basophils Absolute: 0 10*3/uL (ref 0.0–0.1)
Basophils Relative: 0 %
Eosinophils Absolute: 0 10*3/uL (ref 0.0–0.5)
Eosinophils Relative: 0 %
HCT: 39 % (ref 36.0–46.0)
Hemoglobin: 12.6 g/dL (ref 12.0–15.0)
Immature Granulocytes: 0 %
Lymphocytes Relative: 22 %
Lymphs Abs: 2.3 10*3/uL (ref 0.7–4.0)
MCH: 28.4 pg (ref 26.0–34.0)
MCHC: 32.3 g/dL (ref 30.0–36.0)
MCV: 88 fL (ref 80.0–100.0)
Monocytes Absolute: 0.6 10*3/uL (ref 0.1–1.0)
Monocytes Relative: 6 %
Neutro Abs: 7.4 10*3/uL (ref 1.7–7.7)
Neutrophils Relative %: 72 %
Platelets: 283 10*3/uL (ref 150–400)
RBC: 4.43 MIL/uL (ref 3.87–5.11)
RDW: 12.1 % (ref 11.5–15.5)
WBC: 10.4 10*3/uL (ref 4.0–10.5)
nRBC: 0 % (ref 0.0–0.2)

## 2023-05-02 MED ORDER — KETOROLAC TROMETHAMINE 15 MG/ML IJ SOLN: 15.0000 mg | Freq: Once | INTRAMUSCULAR | Status: DC

## 2023-05-02 MED ORDER — KETOROLAC TROMETHAMINE 30 MG/ML IJ SOLN
30.0000 mg | Freq: Once | INTRAMUSCULAR | Status: AC
  Administered 2023-05-02: 30 mg via INTRAMUSCULAR
  Filled 2023-05-02: qty 1

## 2023-05-02 NOTE — Discharge Instructions (Addendum)
It was a pleasure taking care of you in the ED today  You can take Tylenol or Ibuprofen   If you change your mind for the CT scan or have worsening pain, please seek re-evaluation

## 2023-05-02 NOTE — ED Triage Notes (Signed)
Pt c/o LLQ abd pain, "sharp/shooting," localized to uteral area that began on waking. States it's been coming & going since.   Seen at [facility] for same, advised to come to ED for further eval

## 2023-05-02 NOTE — ED Notes (Signed)
ED Provider at bedside. 

## 2023-05-02 NOTE — ED Provider Notes (Signed)
[place] EMERGENCY DEPARTMENT AT [facility] Provider Note   CSN: [id number] Arrival date & time: 05/02/23  1544    History  Chief Complaint  Patient presents with   Abdominal Pain    Lindsay Phillips is a 27 y.o. female here for evaluation of left lower quadrant abdominal pain.  Started when she woke up this morning.  Pain described as aching.  She has never anything like this previously.  Intensity waxes and wanes however it is constant.  No fever, nausea, vomiting, chest pain, shortness of breath, flank pain.  No dysuria, hematuria.  No history of kidney stones.  No vaginal discharge or concern for any STDs.  She denies chance of pregnancy.  She was seen at urgent care prior to arrival had pregnancy test and urinalysis both of which were negative.  She does not anything for pain at this time.  No diarrhea, constipation, blood in stool.  Denies any prior abdominal surgeries  HPI     Home Medications Prior to Admission medications   Medication Sig Start Date End Date Taking? Authorizing Provider  ibuprofen (ADVIL) 600 MG tablet Take 1 tablet (600 mg total) by mouth every 6 (six) hours. 02/07/22   Cashion, Colter L, MD      Allergies    Patient has no known allergies.    Review of Systems   Review of Systems  Constitutional: Negative.   HENT: Negative.    Respiratory: Negative.    Cardiovascular: Negative.   Gastrointestinal:  Positive for abdominal pain. Negative for abdominal distention, anal bleeding, blood in stool, constipation, diarrhea, nausea, rectal pain and vomiting.  Genitourinary:  Positive for pelvic pain. Negative for decreased urine volume, difficulty urinating, dyspareunia, dysuria, enuresis, flank pain, frequency, genital sores, hematuria, menstrual problem, urgency, vaginal bleeding, vaginal discharge and vaginal pain.  Musculoskeletal: Negative.   Skin: Negative.   Neurological: Negative.   All other systems reviewed and are negative.  Physical  Exam Updated Vital Signs BP 101/66   Pulse 61   Temp 97.6 F (36.4 C)   Resp 20   LMP 04/17/2023 (Exact Date)   SpO2 99%   Breastfeeding Yes  Physical Exam Vitals and nursing note reviewed.  Constitutional:      General: She is not in acute distress.    Appearance: She is well-developed. She is not ill-appearing, toxic-appearing or diaphoretic.  HENT:     Head: Atraumatic.  Eyes:     Pupils: Pupils are equal, round, and reactive to light.  Cardiovascular:     Rate and Rhythm: Normal rate.     Heart sounds: Normal heart sounds.  Pulmonary:     Effort: Pulmonary effort is normal. No respiratory distress.     Breath sounds: Normal breath sounds.  Abdominal:     General: Bowel sounds are normal. There is no distension.     Palpations: Abdomen is soft.     Tenderness: There is abdominal tenderness in the left lower quadrant. There is no right CVA tenderness, left CVA tenderness, guarding or rebound. Negative signs include Murphy's sign and McBurney's sign.  Musculoskeletal:        General: Normal range of motion.     Cervical back: Normal range of motion.  Skin:    General: Skin is warm and dry.  Neurological:     General: No focal deficit present.     Mental Status: She is alert.  Psychiatric:        Mood and Affect: Mood normal.  ED Results / Procedures / Treatments   Labs (all labs ordered are listed, but only abnormal results are displayed) Labs Reviewed  COMPREHENSIVE METABOLIC PANEL - Abnormal; Notable for the following components:      Result Value   Glucose, Bld 120 (*)    AST 11 (*)    Total Bilirubin 1.4 (*)    All other components within normal limits  CBC WITH DIFFERENTIAL/PLATELET    EKG None  Radiology US PELVIC COMPLETE W TRANSVAGINAL AND TORSION R/O  Result Date: 05/02/2023 CLINICAL DATA:  [id number] Pelvic pain [id number].  Sharp/shooting pain. EXAM: TRANSABDOMINAL AND TRANSVAGINAL ULTRASOUND OF PELVIS DOPPLER ULTRASOUND OF OVARIES TECHNIQUE: Both  transabdominal and transvaginal ultrasound examinations of the pelvis were performed. Transabdominal technique was performed for global imaging of the pelvis including uterus, ovaries, adnexal regions, and pelvic cul-de-sac. It was necessary to proceed with endovaginal exam following the transabdominal exam to visualize the uterus and bilateral ovaries. Color and duplex Doppler ultrasound was utilized to evaluate blood flow to the ovaries. COMPARISON:  None Available. FINDINGS: Uterus Measurements: 4.5 x 5.1 x 7.7 cm. = volume: 92.2 mL. No fibroids or other mass visualized. Endometrium Thickness: 14 mm.  No focal abnormality visualized. Right ovary Measurements: 2.5 x 3.0 x 4.0 cm = volume: 15.6 mL. Normal appearance/no adnexal mass. Left ovary Measurements: 2.5 x 2.8 x 4.1 cm. = volume: 15.6 mL. Normal appearance/no adnexal mass. Pulsed Doppler evaluation of both ovaries demonstrates normal low-resistance arterial and venous waveforms. While demonstrable color flow and spectral Doppler in an ovary cannot completely exclude ovarian torsion, in combination with a normal grayscale appearance of the ovary, this makes torsion highly unlikely. Other findings No abnormal free fluid. IMPRESSION: *Unremarkable pelvic ultrasound exam. Electronically Signed   By: Jules Schick M.D.   On: 05/02/2023 18:21    Procedures Procedures    Medications Ordered in ED Medications  ketorolac (TORADOL) 30 MG/ML injection 30 mg (30 mg Intramuscular Given 05/02/23 1817)    ED Course/ Medical Decision Making/ A&P   27 year old here for evaluation of left lower quadrant abdominal pain/flank pain which began this morning.  No urinary symptoms.  No change in bowel movements.  She denies any concern for any STIs.  Is never anything like this previously.  She denies chance of pregnancy.  Seen at urgent care prior to arrival I am able to see their note and labs.  She had urinalysis was negative for infection as well as a pregnancy  test which was negative at that time.  Labs and imaging personally viewed and interpreted:  CBC without leukocytosis metabolic panel glucose 120, t bili 1.4 [place] without acute abnormality  Discussed labs and imaging with patient. Rec further workup with CT, pelvic ex. Patient declined. We discussed risk versus benefit.  She voiced understanding.  She would like to go home. She is breastfeeding and would no want anything stronger for pain other than Tylenol/ Ibuprofen.  She will return if her pain does not resolve.  Could certainly have a kidney stone given she did have some blood in her urine at urgent care.  Her pregnancy test was negative there in which I viewed.  She has no diarrhea, constipation to suggest perforation, abscess.  She has no back pain to suggest radiculopathy as cause of her symptoms.  Urine urgent care did not indicate UTI.  The patient has been appropriately medically screened and/or stabilized in the ED. I have low suspicion for any other emergent medical condition which would require  further screening, evaluation or treatment in the ED or require inpatient management.  Patient is hemodynamically stable and in no acute distress.  Patient able to ambulate in department prior to ED.  Evaluation does not show acute pathology that would require ongoing or additional emergent interventions while in the emergency department or further inpatient treatment.  I have discussed the diagnosis with the patient and answered all questions.  Pain is been managed while in the emergency department and patient has no further complaints prior to discharge.  Patient is comfortable with plan discussed in room and is stable for discharge at this time.  I have discussed strict return precautions for returning to the emergency department.  Patient was encouraged to follow-up with PCP/specialist refer to at discharge.                                 Medical Decision Making Amount and/or Complexity of  Data Reviewed External Data Reviewed: labs, radiology and notes. Labs: ordered. Decision-making details documented in ED Course. Radiology: ordered and independent interpretation performed. Decision-making details documented in ED Course.  Risk OTC drugs. Prescription drug management. Decision regarding hospitalization. Diagnosis or treatment significantly limited by social determinants of health.         Final Clinical Impression(s) / ED Diagnoses Final diagnoses:  LLQ abdominal pain    Rx / DC Orders ED Discharge Orders     None         Dedra Matsuo A, PA-C 05/02/23 2108    Glyn Ade, MD 05/02/23 2216

## 2023-05-06 NOTE — Progress Notes (Unsigned)
NEUROLOGY FOLLOW UP OFFICE NOTE  Lindsay Phillips [id number]  Assessment/Plan:   Left-sided trigeminal neuralgia Left lumbar radiculopathy Probable left de Quervain's tenosynovitis   Patient is concerned that she may have multiple sclerosis as all of her symptoms are left sided.  I assured her that she does not have MS.  MRI of brain was unremarkable and there are other reasonable explanations for her other symptoms, which are not consistent with MS symptoms.  Regarding TN, will continue to monitor off medication.  If medication required, will start gabapentin Regarding radiculopathy, refer to physical therapy.  If no improvement in 4 to 6 weeks, will check MRI of lumbar spine     Subjective:  Lindsay Phillips is a 27 year old right-handed female who follows up for left sided trigeminal neuralgia.    UPDATE: Overall, the TN is stable.  She may have a flare up with stress but not severe.  It is more of a twinge and occurs a couple of times month.  Doesn't think she needs to start the gabapentin.    Last month, she developed left thumb pain and numbness.  She went to Urgent Care on 11/26 and was diagnosed with de Quervain's tenosynovitis.  She was prescribed a medrol dose pack which was ineffective.  Last week, she developed left lower quadrant abdominal pain that radiated down the left lateral leg with associated numbness in the inguinal region.  While driving, her entire left leg became numb.  Seen in ED.  [place] of pelvis was unremarkable.  Notes back pain.  No change in bowel and bladder function.       HISTORY: On 06/14/2022, she developed left sided facial pain and nasal congestion.  She was seen in the ED on 1/20 where she was prescribed Augmentin and Afrin for acute sinusitis but facial pain progressively worsened.  She describes numbing and burning pounding pain in the periorbital and left maxillary region that spread to involve down into her gums and along the jaw.  Eating, brushing teeth  and light touch of the face triggers it.  About a month prior, she had COVID and was experiencing intermittent left sided headaches.  She saw the dentist who didn't find anything concerning.  The pain was persistent until she received a steroid shot on 1/22 and now it is mild facial pain/soreness.  She also has an aching and pressure in left occipital region aggravated with laughing or prolonged smiling.  No neck pain.    MRI of brain and trigeminal nerves without contrast on 07/21/2022 was unremarkable.  Performed without contrast as she was breastfeeding.     Her aunt has similar symptoms over the past year.  She is being treated by neurology.    PAST MEDICAL HISTORY: Past Medical History:  Diagnosis Date   Medical history non-contributory    Trigeminal neuralgia     MEDICATIONS: Current Outpatient Medications on File Prior to Visit  Medication Sig Dispense Refill   ibuprofen (ADVIL) 600 MG tablet Take 1 tablet (600 mg total) by mouth every 6 (six) hours. 30 tablet 0   No current facility-administered medications on file prior to visit.    ALLERGIES: No Known Allergies  FAMILY HISTORY: Family History  Problem Relation Age of Onset   Healthy Mother    Healthy Father    Hypertension Maternal Grandmother    Lupus Paternal Grandmother    Asthma Neg Hx    Birth defects Neg Hx    Cancer Neg Hx  Diabetes Neg Hx    Heart disease Neg Hx    Stroke Neg Hx       Objective:  Blood pressure 119/76, pulse 90, height 5\' 6"  (1.676 m), weight 177 lb 9.6 oz (80.6 kg), last menstrual period 04/17/2023, SpO2 98%, currently breastfeeding. General: No acute distress.  Patient appears well-groomed.   Head:  Normocephalic/atraumatic Neck:  Supple.  No paraspinal tenderness.  Full range of motion. Heart:  Regular rate and rhythm. Back:  Tenderness to palpation of left lumbar region. Straight leg raise negative Neuro:  Alert and oriented.  Speech fluent and not dysarthric.  Language intact.   CN II-XII intact.  Bulk and tone normal.  Muscle strength 5/5 throughout.  Deep tendon reflexes 2+ throughout.  Gait normal.  Romberg negative.   Shon Millet, DO

## 2023-05-07 ENCOUNTER — Ambulatory Visit (INDEPENDENT_AMBULATORY_CARE_PROVIDER_SITE_OTHER): Payer: BC Managed Care – PPO | Admitting: Neurology

## 2023-05-07 ENCOUNTER — Encounter: Payer: Self-pay | Admitting: Neurology

## 2023-05-07 VITALS — BP 119/76 | HR 90 | Ht 66.0 in | Wt 177.6 lb

## 2023-05-07 DIAGNOSIS — M5416 Radiculopathy, lumbar region: Secondary | ICD-10-CM | POA: Diagnosis not present

## 2023-05-07 DIAGNOSIS — M654 Radial styloid tenosynovitis [de Quervain]: Secondary | ICD-10-CM

## 2023-05-07 DIAGNOSIS — G500 Trigeminal neuralgia: Secondary | ICD-10-CM

## 2023-05-07 NOTE — Patient Instructions (Addendum)
Monitor off gabapentin The leg pain and numbness is likely pinched nerve in back.  Will order physical therapy.  If no improvement after 4 to 6 weeks, would get MRI of lumbar spine Follow up 5 months.

## 2023-05-10 ENCOUNTER — Ambulatory Visit: Payer: BC Managed Care – PPO

## 2023-06-07 ENCOUNTER — Ambulatory Visit: Payer: BC Managed Care – PPO | Admitting: Physical Therapy

## 2023-10-17 ENCOUNTER — Encounter: Payer: Medicaid Other | Admitting: Nurse Practitioner

## 2023-10-24 NOTE — Progress Notes (Deleted)
 NEUROLOGY FOLLOW UP OFFICE NOTE  Lindsay Phillips [id number]  Assessment/Plan:   Left-sided trigeminal neuralgia Left lumbar radiculopathy   Regarding TN: will continue to monitor off medication.  If medication required, will start gabapentin Regarding lumbar radiculopathy: *** Follow up ***     Subjective:  Lindsay Phillips is a 28 year old right-handed female who follows up for left sided trigeminal neuralgia.    UPDATE: Trigeminal neuralgia: Overall, the TN is stable.  She may have a flare up with stress but not severe.  It is more of a twinge and occurs a couple of times month.  Doesn't think she needs to start the gabapentin.    Lumbar radiculopathy: Referred to physical therapy.  ***    HISTORY: Trigeminal neuralgia: On 06/14/2022, she developed left sided facial pain and nasal congestion.  She was seen in the ED on 1/20 where she was prescribed Augmentin  and Afrin for acute sinusitis but facial pain progressively worsened.  She describes numbing and burning pounding pain in the periorbital and left maxillary region that spread to involve down into her gums and along the jaw.  Eating, brushing teeth and light touch of the face triggers it.  About a month prior, she had COVID and was experiencing intermittent left sided headaches.  She saw the dentist who didn't find anything concerning.  The pain was persistent until she received a steroid shot on 06/18/2022 and now it is mild facial pain/soreness.  She also has an aching and pressure in left occipital region aggravated with laughing or prolonged smiling.  No neck pain.    MRI of brain and trigeminal nerves without contrast on 07/21/2022 was unremarkable.  Performed without contrast as she was breastfeeding.   Lumbar radiculopathy: In December 2024, she developed left lower quadrant abdominal pain that radiated down the left lateral leg with associated numbness in the inguinal region.  While driving, her entire left leg became numb.   Seen in ED.  US  of pelvis was unremarkable.  Notes back pain.  No change in bowel and bladder function.     PAST MEDICAL HISTORY: Past Medical History:  Diagnosis Date   Medical history non-contributory    Trigeminal neuralgia     MEDICATIONS: Current Outpatient Medications on File Prior to Visit  Medication Sig Dispense Refill   ibuprofen  (ADVIL ) 600 MG tablet Take 1 tablet (600 mg total) by mouth every 6 (six) hours. 30 tablet 0   No current facility-administered medications on file prior to visit.    ALLERGIES: No Known Allergies  FAMILY HISTORY: Family History  Problem Relation Age of Onset   Healthy Mother    Healthy Father    Hypertension Maternal Grandmother    Lupus Paternal Grandmother    Asthma Neg Hx    Birth defects Neg Hx    Cancer Neg Hx    Diabetes Neg Hx    Heart disease Neg Hx    Stroke Neg Hx       Objective:  *** General: No acute distress.  Patient appears well-groomed.   Head:  Normocephalic/atraumatic Neck:  Supple.  No paraspinal tenderness.  Full range of motion. Heart:  Regular rate and rhythm. Neuro:  Alert and oriented.  Speech fluent and not dysarthric.  Language intact.  CN II-XII intact.  Bulk and tone normal.  Muscle strength 5/5 throughout.  Sensation to light touch intact.  Deep tendon reflexes 2+ throughout, toes downgoing.  Gait normal.  Romberg negative.    Janne Members, DO

## 2023-10-25 ENCOUNTER — Ambulatory Visit: Payer: BC Managed Care – PPO | Admitting: Neurology

## 2024-04-21 ENCOUNTER — Ambulatory Visit

## 2024-05-11 ENCOUNTER — Ambulatory Visit: Admitting: Obstetrics & Gynecology

## 2024-10-15 ENCOUNTER — Ambulatory Visit: Admitting: Dermatology
